# Patient Record
Sex: Female | Born: 1945 | Race: White | Hispanic: No | Marital: Married | Smoking: Former smoker
Health system: Southern US, Community
[De-identification: ages and names within clinical notes are randomized; demographics above are authoritative.]

## PROBLEM LIST (undated history)

## (undated) DIAGNOSIS — Z9889 Other specified postprocedural states: Secondary | ICD-10-CM

## (undated) DIAGNOSIS — R112 Nausea with vomiting, unspecified: Secondary | ICD-10-CM

## (undated) DIAGNOSIS — R7989 Other specified abnormal findings of blood chemistry: Secondary | ICD-10-CM

## (undated) DIAGNOSIS — K219 Gastro-esophageal reflux disease without esophagitis: Secondary | ICD-10-CM

## (undated) DIAGNOSIS — K649 Unspecified hemorrhoids: Secondary | ICD-10-CM

## (undated) DIAGNOSIS — F32A Depression, unspecified: Secondary | ICD-10-CM

## (undated) DIAGNOSIS — R945 Abnormal results of liver function studies: Secondary | ICD-10-CM

## (undated) DIAGNOSIS — I779 Disorder of arteries and arterioles, unspecified: Secondary | ICD-10-CM

## (undated) DIAGNOSIS — I739 Peripheral vascular disease, unspecified: Secondary | ICD-10-CM

## (undated) DIAGNOSIS — I119 Hypertensive heart disease without heart failure: Secondary | ICD-10-CM

## (undated) DIAGNOSIS — M1990 Unspecified osteoarthritis, unspecified site: Secondary | ICD-10-CM

## (undated) DIAGNOSIS — F329 Major depressive disorder, single episode, unspecified: Secondary | ICD-10-CM

## (undated) DIAGNOSIS — I10 Essential (primary) hypertension: Secondary | ICD-10-CM

## (undated) DIAGNOSIS — I350 Nonrheumatic aortic (valve) stenosis: Secondary | ICD-10-CM

## (undated) DIAGNOSIS — N200 Calculus of kidney: Secondary | ICD-10-CM

## (undated) DIAGNOSIS — G4700 Insomnia, unspecified: Secondary | ICD-10-CM

## (undated) DIAGNOSIS — I2510 Atherosclerotic heart disease of native coronary artery without angina pectoris: Secondary | ICD-10-CM

## (undated) DIAGNOSIS — F419 Anxiety disorder, unspecified: Secondary | ICD-10-CM

## (undated) DIAGNOSIS — E039 Hypothyroidism, unspecified: Secondary | ICD-10-CM

## (undated) DIAGNOSIS — O09299 Supervision of pregnancy with other poor reproductive or obstetric history, unspecified trimester: Secondary | ICD-10-CM

## (undated) DIAGNOSIS — E785 Hyperlipidemia, unspecified: Secondary | ICD-10-CM

## (undated) DIAGNOSIS — N6019 Diffuse cystic mastopathy of unspecified breast: Secondary | ICD-10-CM

## (undated) DIAGNOSIS — D649 Anemia, unspecified: Secondary | ICD-10-CM

## (undated) DIAGNOSIS — K5790 Diverticulosis of intestine, part unspecified, without perforation or abscess without bleeding: Secondary | ICD-10-CM

## (undated) HISTORY — DX: Diverticulosis of intestine, part unspecified, without perforation or abscess without bleeding: K57.90

## (undated) HISTORY — DX: Abnormal results of liver function studies: R94.5

## (undated) HISTORY — DX: Major depressive disorder, single episode, unspecified: F32.9

## (undated) HISTORY — DX: Diffuse cystic mastopathy of unspecified breast: N60.19

## (undated) HISTORY — DX: Depression, unspecified: F32.A

## (undated) HISTORY — DX: Hypertensive heart disease without heart failure: I11.9

## (undated) HISTORY — DX: Hyperlipidemia, unspecified: E78.5

## (undated) HISTORY — DX: Other specified abnormal findings of blood chemistry: R79.89

## (undated) HISTORY — DX: Unspecified hemorrhoids: K64.9

## (undated) HISTORY — DX: Insomnia, unspecified: G47.00

## (undated) HISTORY — DX: Peripheral vascular disease, unspecified: I73.9

## (undated) HISTORY — PX: HEMORRHOID SURGERY: SHX153

## (undated) HISTORY — DX: Atherosclerotic heart disease of native coronary artery without angina pectoris: I25.10

## (undated) HISTORY — DX: Essential (primary) hypertension: I10

## (undated) HISTORY — DX: Anxiety disorder, unspecified: F41.9

## (undated) HISTORY — DX: Calculus of kidney: N20.0

## (undated) HISTORY — DX: Supervision of pregnancy with other poor reproductive or obstetric history, unspecified trimester: O09.299

## (undated) HISTORY — DX: Disorder of arteries and arterioles, unspecified: I77.9

## (undated) HISTORY — PX: CARDIAC CATHETERIZATION: SHX172

---

## 1968-01-03 HISTORY — PX: TUBAL LIGATION: SHX77

## 1991-01-03 HISTORY — PX: CORONARY ARTERY BYPASS GRAFT: SHX141

## 1997-05-14 ENCOUNTER — Ambulatory Visit (HOSPITAL_COMMUNITY): Admission: RE | Admit: 1997-05-14 | Discharge: 1997-05-14 | Payer: Self-pay | Admitting: *Deleted

## 1997-12-03 ENCOUNTER — Other Ambulatory Visit: Admission: RE | Admit: 1997-12-03 | Discharge: 1997-12-03 | Payer: Self-pay | Admitting: *Deleted

## 1998-10-01 ENCOUNTER — Ambulatory Visit (HOSPITAL_COMMUNITY): Admission: RE | Admit: 1998-10-01 | Discharge: 1998-10-01 | Payer: Self-pay | Admitting: *Deleted

## 1998-10-01 ENCOUNTER — Encounter: Payer: Self-pay | Admitting: *Deleted

## 1998-10-05 ENCOUNTER — Ambulatory Visit (HOSPITAL_COMMUNITY): Admission: RE | Admit: 1998-10-05 | Discharge: 1998-10-05 | Payer: Self-pay | Admitting: *Deleted

## 1998-10-05 ENCOUNTER — Encounter: Payer: Self-pay | Admitting: *Deleted

## 1998-12-06 ENCOUNTER — Other Ambulatory Visit: Admission: RE | Admit: 1998-12-06 | Discharge: 1998-12-06 | Payer: Self-pay | Admitting: *Deleted

## 1999-06-22 ENCOUNTER — Encounter: Admission: RE | Admit: 1999-06-22 | Discharge: 1999-06-22 | Payer: Self-pay | Admitting: *Deleted

## 1999-06-22 ENCOUNTER — Other Ambulatory Visit: Admission: RE | Admit: 1999-06-22 | Discharge: 1999-06-22 | Payer: Self-pay | Admitting: *Deleted

## 1999-06-22 ENCOUNTER — Encounter: Payer: Self-pay | Admitting: *Deleted

## 1999-09-07 ENCOUNTER — Encounter: Admission: RE | Admit: 1999-09-07 | Discharge: 1999-09-07 | Payer: Self-pay | Admitting: *Deleted

## 1999-09-07 ENCOUNTER — Encounter: Payer: Self-pay | Admitting: *Deleted

## 2000-01-03 HISTORY — PX: CHOLECYSTECTOMY: SHX55

## 2000-02-20 ENCOUNTER — Other Ambulatory Visit: Admission: RE | Admit: 2000-02-20 | Discharge: 2000-02-20 | Payer: Self-pay | Admitting: *Deleted

## 2000-02-23 ENCOUNTER — Encounter: Admission: RE | Admit: 2000-02-23 | Discharge: 2000-02-23 | Payer: Self-pay | Admitting: *Deleted

## 2000-02-23 ENCOUNTER — Encounter: Payer: Self-pay | Admitting: *Deleted

## 2000-05-31 ENCOUNTER — Encounter: Admission: RE | Admit: 2000-05-31 | Discharge: 2000-05-31 | Payer: Self-pay | Admitting: Internal Medicine

## 2000-05-31 ENCOUNTER — Encounter: Payer: Self-pay | Admitting: Internal Medicine

## 2000-08-28 ENCOUNTER — Encounter: Admission: RE | Admit: 2000-08-28 | Discharge: 2000-08-28 | Payer: Self-pay | Admitting: *Deleted

## 2000-08-28 ENCOUNTER — Encounter: Payer: Self-pay | Admitting: *Deleted

## 2000-09-04 ENCOUNTER — Other Ambulatory Visit: Admission: RE | Admit: 2000-09-04 | Discharge: 2000-09-04 | Payer: Self-pay | Admitting: *Deleted

## 2000-09-10 ENCOUNTER — Encounter: Payer: Self-pay | Admitting: *Deleted

## 2000-09-10 ENCOUNTER — Encounter: Admission: RE | Admit: 2000-09-10 | Discharge: 2000-09-10 | Payer: Self-pay | Admitting: *Deleted

## 2000-11-10 ENCOUNTER — Encounter: Payer: Self-pay | Admitting: General Surgery

## 2000-11-10 ENCOUNTER — Encounter: Payer: Self-pay | Admitting: Emergency Medicine

## 2000-11-10 ENCOUNTER — Inpatient Hospital Stay (HOSPITAL_COMMUNITY): Admission: EM | Admit: 2000-11-10 | Discharge: 2000-11-11 | Payer: Self-pay | Admitting: Emergency Medicine

## 2001-03-27 ENCOUNTER — Other Ambulatory Visit: Admission: RE | Admit: 2001-03-27 | Discharge: 2001-03-27 | Payer: Self-pay | Admitting: *Deleted

## 2001-10-14 ENCOUNTER — Other Ambulatory Visit: Admission: RE | Admit: 2001-10-14 | Discharge: 2001-10-14 | Payer: Self-pay | Admitting: Obstetrics and Gynecology

## 2002-05-09 ENCOUNTER — Encounter: Payer: Self-pay | Admitting: Internal Medicine

## 2002-05-09 ENCOUNTER — Encounter: Admission: RE | Admit: 2002-05-09 | Discharge: 2002-05-09 | Payer: Self-pay | Admitting: Internal Medicine

## 2003-01-01 ENCOUNTER — Ambulatory Visit (HOSPITAL_COMMUNITY): Admission: RE | Admit: 2003-01-01 | Discharge: 2003-01-01 | Payer: Self-pay | Admitting: Internal Medicine

## 2003-02-24 ENCOUNTER — Other Ambulatory Visit: Admission: RE | Admit: 2003-02-24 | Discharge: 2003-02-24 | Payer: Self-pay | Admitting: Obstetrics and Gynecology

## 2005-12-14 ENCOUNTER — Other Ambulatory Visit: Admission: RE | Admit: 2005-12-14 | Discharge: 2005-12-14 | Payer: Self-pay | Admitting: Family Medicine

## 2007-01-15 ENCOUNTER — Ambulatory Visit (HOSPITAL_COMMUNITY): Admission: RE | Admit: 2007-01-15 | Discharge: 2007-01-15 | Payer: Self-pay | Admitting: Interventional Cardiology

## 2007-01-15 ENCOUNTER — Encounter (INDEPENDENT_AMBULATORY_CARE_PROVIDER_SITE_OTHER): Payer: Self-pay | Admitting: Interventional Cardiology

## 2008-06-03 ENCOUNTER — Ambulatory Visit: Payer: Self-pay | Admitting: Gastroenterology

## 2008-06-17 ENCOUNTER — Ambulatory Visit: Payer: Self-pay | Admitting: Gastroenterology

## 2008-06-17 ENCOUNTER — Encounter: Payer: Self-pay | Admitting: Gastroenterology

## 2008-06-19 ENCOUNTER — Encounter: Payer: Self-pay | Admitting: Gastroenterology

## 2008-06-29 DIAGNOSIS — Z860100 Personal history of colon polyps, unspecified: Secondary | ICD-10-CM | POA: Insufficient documentation

## 2008-06-29 DIAGNOSIS — K5909 Other constipation: Secondary | ICD-10-CM | POA: Insufficient documentation

## 2008-06-29 DIAGNOSIS — I2510 Atherosclerotic heart disease of native coronary artery without angina pectoris: Secondary | ICD-10-CM

## 2008-06-29 DIAGNOSIS — E785 Hyperlipidemia, unspecified: Secondary | ICD-10-CM | POA: Insufficient documentation

## 2008-06-29 DIAGNOSIS — K649 Unspecified hemorrhoids: Secondary | ICD-10-CM | POA: Insufficient documentation

## 2008-06-29 DIAGNOSIS — K6389 Other specified diseases of intestine: Secondary | ICD-10-CM

## 2008-07-02 ENCOUNTER — Ambulatory Visit: Payer: Self-pay | Admitting: Gastroenterology

## 2008-07-02 DIAGNOSIS — F3289 Other specified depressive episodes: Secondary | ICD-10-CM | POA: Insufficient documentation

## 2008-07-02 DIAGNOSIS — K625 Hemorrhage of anus and rectum: Secondary | ICD-10-CM

## 2008-07-02 DIAGNOSIS — F329 Major depressive disorder, single episode, unspecified: Secondary | ICD-10-CM

## 2008-07-02 DIAGNOSIS — E039 Hypothyroidism, unspecified: Secondary | ICD-10-CM | POA: Insufficient documentation

## 2008-07-02 DIAGNOSIS — Z87442 Personal history of urinary calculi: Secondary | ICD-10-CM | POA: Insufficient documentation

## 2008-07-03 ENCOUNTER — Encounter: Payer: Self-pay | Admitting: Gastroenterology

## 2008-07-03 LAB — CONVERTED CEMR LAB: CRP: 0 mg/dL (ref <0.6)

## 2008-07-27 DIAGNOSIS — D509 Iron deficiency anemia, unspecified: Secondary | ICD-10-CM

## 2008-07-27 LAB — CONVERTED CEMR LAB
Ferritin: 19.7 ng/mL (ref 10.0–291.0)
Folate: >20 ng/mL
Iron: 78 ug/dL (ref 42–145)
Saturation Ratios: 19.9 % — ABNORMAL LOW (ref 20.0–50.0)
Transferrin: 280.1 mg/dL (ref 212.0–360.0)
Vitamin B-12: 466 pg/mL (ref 211–911)

## 2008-08-03 ENCOUNTER — Ambulatory Visit: Payer: Self-pay | Admitting: Gastroenterology

## 2008-11-13 ENCOUNTER — Telehealth (INDEPENDENT_AMBULATORY_CARE_PROVIDER_SITE_OTHER): Payer: Self-pay | Admitting: *Deleted

## 2010-01-23 ENCOUNTER — Encounter: Payer: Self-pay | Admitting: Obstetrics and Gynecology

## 2012-02-12 ENCOUNTER — Encounter (INDEPENDENT_AMBULATORY_CARE_PROVIDER_SITE_OTHER): Payer: Medicare Other | Admitting: General Surgery

## 2012-02-15 ENCOUNTER — Ambulatory Visit (INDEPENDENT_AMBULATORY_CARE_PROVIDER_SITE_OTHER): Payer: Medicare Other | Admitting: General Surgery

## 2012-02-22 ENCOUNTER — Ambulatory Visit (INDEPENDENT_AMBULATORY_CARE_PROVIDER_SITE_OTHER): Payer: Medicare Other | Admitting: General Surgery

## 2012-02-23 ENCOUNTER — Encounter (INDEPENDENT_AMBULATORY_CARE_PROVIDER_SITE_OTHER): Payer: Self-pay | Admitting: General Surgery

## 2012-02-29 ENCOUNTER — Ambulatory Visit (INDEPENDENT_AMBULATORY_CARE_PROVIDER_SITE_OTHER): Payer: Medicare Other | Admitting: General Surgery

## 2012-02-29 ENCOUNTER — Encounter (INDEPENDENT_AMBULATORY_CARE_PROVIDER_SITE_OTHER): Payer: Self-pay | Admitting: General Surgery

## 2012-02-29 VITALS — BP 134/78 | HR 78 | Temp 98.4°F | Resp 18 | Ht 64.0 in | Wt 140.2 lb

## 2012-02-29 DIAGNOSIS — K648 Other hemorrhoids: Secondary | ICD-10-CM | POA: Insufficient documentation

## 2012-02-29 DIAGNOSIS — K644 Residual hemorrhoidal skin tags: Secondary | ICD-10-CM

## 2012-02-29 NOTE — Progress Notes (Signed)
Patient ID: Dawn Boyer, female   DOB: [birth year], 67 y.o.   MRN: [id number]  Chief Complaint  Patient presents with  . New Evaluation    eval hems    HPI Dawn Boyer is a 67 y.o. female.   HPI 66 year old Caucasian female referred by Dr. Claude Manges for evaluation of bleeding hemorrhoids. The patient states that she has had hemorrhoidal problems for 40 years ever since her birth of her son. She states that she has daily bowel movements however that is only with the aid of an enema. She uses enemas on a daily basis. She states every year she has tried numerous efforts for having regular bowel movements. She has tried laxatives as well as Metamucil all without any benefit. She denies any pain with defecation. Occasionally when one of the hemorrhoids pops out it'll cause a fair amount of discomfort and pain. Her main issue is significant bleeding. She states that she's actually become anemic because of her hemorrhoidal bleeding. She states that she's had numerous colonoscopies over the years all of which have been normal except for colonic polyps. I do not have a copy of these records. Occasionally the stools will be very hard. She only drinks about 2 glasses of water a day. She does tend to sit on the commode for a period of time. When her hemorrhoids do pop out and play her she will do sitz baths as well as Preparation H. She describes the bleeding as mainly in the toilet as well as mixed in the stool. She has had a prior sclerotherapy to her hemorrhoids about 13 years ago which worked for short term Past Medical History  Diagnosis Date  . Coronary artery disease   . Hypertension   . Hyperlipidemia   . History of hemolysis, elevated liver enzymes, and low platelet (HELLP) syndrome, currently pregnant   . Hemorrhoid   . Fibrocystic breast   . Insomnia   . Anxiety   . Depression   . Heart disease     Past Surgical History  Procedure Laterality Date  . Coronary artery bypass graft  1993  .  Tubal ligation  1970  . Hemorrhoid surgery  2001  . Cholecystectomy  2002    Family History  Problem Relation Age of Onset  . Heart disease Sister   . Heart disease Brother     Social History History  Substance Use Topics  . Smoking status: Former Smoker    Quit date: 11/21/1982  . Smokeless tobacco: Never Used  . Alcohol Use: No    No Known Allergies  Current Outpatient Prescriptions  Medication Sig Dispense Refill  . ALPRAZolam (XANAX) 0.25 MG tablet Take 0.25 mg by mouth at bedtime as needed for sleep.      Marland Kitchen aspirin 325 MG tablet Take 325 mg by mouth daily.      Marland Kitchen atorvastatin (LIPITOR) 80 MG tablet Take 80 mg by mouth daily.      . Calcium Carbonate (CALCIUM 600 PO) Take 1 tablet by mouth daily.      . Cholecalciferol (VITAMIN D-3) 1000 UNITS CAPS Take by mouth daily.      Marland Kitchen esomeprazole (NEXIUM) 40 MG capsule Take 40 mg by mouth daily before breakfast.      . ezetimibe (ZETIA) 10 MG tablet Take 10 mg by mouth daily.      Marland Kitchen FeFum-FePo-FA-B Cmp-C-Zn-Mn-Cu (SE-TAN PLUS) 162-115.2-1 MG CAPS Take by mouth.      . levothyroxine (SYNTHROID, LEVOTHROID) 50 MCG tablet  Take 50 mcg by mouth daily.      . metoprolol succinate (TOPROL-XL) 50 MG 24 hr tablet Take 50 mg by mouth daily. Take with or immediately following a meal.      . phosphate (FLEET) enema Place 1 enema rectally once as needed for constipation.      . quinapril (ACCUPRIL) 20 MG tablet Take 20 mg by mouth at bedtime.      . sertraline (ZOLOFT) 50 MG tablet Take 50 mg by mouth daily.      . vitamin E 400 UNIT capsule Take 400 Units by mouth daily.       No current facility-administered medications for this visit.    Review of Systems Review of Systems  Constitutional: Negative for fever, chills and unexpected weight change.  HENT: Negative for hearing loss, congestion, sore throat, trouble swallowing and voice change.   Eyes: Negative for visual disturbance.  Respiratory: Negative for cough and wheezing.    Cardiovascular: Negative for chest pain, palpitations and leg swelling.       Denies CP, DOE, SOB, PND  Gastrointestinal: Positive for constipation and anal bleeding. Negative for nausea, vomiting, abdominal pain, diarrhea, blood in stool and abdominal distention.  Genitourinary: Negative for hematuria, vaginal bleeding and difficulty urinating.  Musculoskeletal: Negative for arthralgias.       + joint pain  Skin: Negative for rash and wound.  Neurological: Positive for dizziness. Negative for seizures, syncope and headaches.  Hematological: Negative for adenopathy. Does not bruise/bleed easily.  Psychiatric/Behavioral: Negative for confusion.    Blood pressure 134/78, pulse 78, temperature 98.4 F (36.9 C), temperature source Temporal, resp. rate 18, height 5\' 4"  (1.626 m), weight 140 lb 3.2 oz (63.594 kg).  Physical Exam Physical Exam  Vitals reviewed. Constitutional: She is oriented to person, place, and time. She appears well-developed and well-nourished. No distress.  HENT:  Head: Normocephalic and atraumatic.  Right Ear: External ear normal.  Left Ear: External ear normal.  Eyes: Conjunctivae are normal. No scleral icterus.  Neck: Normal range of motion. Neck supple. No tracheal deviation present. No thyromegaly present.  Cardiovascular: Normal rate, regular rhythm and normal heart sounds.   Pulmonary/Chest: Effort normal and breath sounds normal. No respiratory distress. She has no wheezes. She has no rales.  Abdominal: Soft. She exhibits no distension. There is no tenderness. There is no rebound and no guarding.  Well healed trocar sites  Genitourinary: Rectal exam shows external hemorrhoid and internal hemorrhoid. Rectal exam shows no fissure and anal tone normal.  Circumferential nonthrombosed redundant external hemorrhoidal tissue. A little diminished rectal tone. Patient has a partially prolapsed large left-sided internal hemorrhoid. Anoscopy showed a large left-sided  Somewhat friable internal hemorrhoid; Right anterior and posterior internal hemorrhoids. The right posterior hemorrhoid was larger and more friable appearing than the anterior hemorrhoid  Musculoskeletal: She exhibits no edema and no tenderness.  Lymphadenopathy:    She has no cervical adenopathy.  Neurological: She is alert and oriented to person, place, and time.  Skin: Skin is warm and dry. No rash noted. She is not diaphoretic. No erythema.  Psychiatric: She has a normal mood and affect. Her behavior is normal. Judgment and thought content normal.    Data Reviewed Dr Orvan July note 2/6  Assessment    Circumferential non-thrombosed external hemorrhoids Large partially prolapsed left internal hemorrhoid Rt anterior and posterior internal hemorrhoids (grade 1 and 2)     Plan    We discussed the etiology of hemorrhoids. The patient was given  educational material as well as diagrams. We discussed nonoperative and operative management of hemorrhoidal disease.  We discussed the importance of having a daily soft bowel movement and avoiding constipation. We also discussed good bowel habits such as not reading in the bathroom, not straining, and drinking 6-8 glasses of water per day. We also discussed the importance of a high fiber diet. We discussed foods that were high in fiber as well as fiber supplements. We discussed the importance of trying to get 25-30 g of fiber per day in their diet. We discussed the need to start with a low dose of fiber and then gradually increasing their daily fiber dose over several weeks in order to avoid bloating and cramping.  We then discussed different surgical techniques for hemorrhoids, specifically hemorrhoidal banding and excisional hemorrhoidectomy.  PLAN: The patient has chronic issues with bowel movements. I am not sure if increasing her water intake and increasing her fiber supplement likely correct her underlying bowel motility. Nonetheless I did highly  encourage her to increase her water intake as well as to slowly add a fiber supplement to her diet such as Benefiber or Metamucil. Even with Addition of the strategies I think she needs and  would benefit from hemorrhoidectomy and hemorrhoidal banding.  I believe the left-sided hemorrhoid would only be a candidate for an excisional hemorrhoidectomy. Wears a right-sided hemorrhoids are a candidate for hemorrhoidal banding.  I discussed the procedure in detail.  The patient was given Agricultural engineer.  We discussed the risks and benefits of surgery including, but not limited to bleeding, infection, blood clot formation, anesthesia risk, urinary retention, hemorrhoid recurrence, injury to the sphincters resulting in incontinence, and the rare possibility of anal canal narrowing. I explained that the likelihood of improvement of their symptoms is fair  We discussed the typical postoperative course.  I stressed the importance of not becoming constipated after surgery.  The patient was encouraged to limit pain medication if possible as this increases the likelihood of becoming constipated. The patient was advised to take stool softners & drink 8-10 glasses of non-carbonated, non-alcoholic beverages per day and to eat a high fiber diet.  I also encouraged soaking in a water warm bath for 15 minutes at a time several times a day and after a bowel movement.  The patient was advised to take laxatives such as milk of magnesia or Miralax if no bowel movement three days after surgery.  The patient was advised to expect some blood tinged drainage as well as some blood in their bowel movements.    I explained to the patient that she is at an increased risk for hemorrhoidal recurrence due to her underlying bowel habits. She would like to proceed with surgery. She will be scheduled for exam under anesthesia, excisional hemorrhoidectomy and hemorrhoidal banding in the near future  Jeanifer Halliday M. Andrey Campanile, MD, FACS General,  Bariatric, & Minimally Invasive Surgery [facility], [place]        [facility] M 02/29/2012, 3:35 PM

## 2012-02-29 NOTE — Patient Instructions (Signed)
Increase the water that you drink on a daily basis to 6-8 glasses of water a day. Also consider getting a fiber supplement such as Metamucil or Benefiber. Remember that you need to slowly increase the fiber in your diet over 2 weeks in order to avoid bloating and cramping as discussed  GETTING TO GOOD BOWEL HEALTH. Irregular bowel habits such as constipation and diarrhea can lead to many problems over time.  Having one soft bowel movement a day is the most important way to prevent further problems.  The anorectal canal is designed to handle stretching and feces to safely manage our ability to get rid of solid waste (feces, poop, stool) out of our body.  BUT, hard constipated stools can act like ripping concrete bricks and diarrhea can be a burning fire to this very sensitive area of our body, causing inflamed hemorrhoids, anal fissures, increasing risk is perirectal abscesses, abdominal pain/bloating, an making irritable bowel worse.     The goal: ONE SOFT BOWEL MOVEMENT A DAY!  To have soft, regular bowel movements:    Drink at least 8 tall glasses of water a day.     Take plenty of fiber.  Fiber is the undigested part of plant food that passes into the colon, acting s "natures broom" to encourage bowel motility and movement.  Fiber can absorb and hold large amounts of water. This results in a larger, bulkier stool, which is soft and easier to pass. Work gradually over several weeks up to 6 servings a day of fiber (25g a day even more if needed) in the form of: o Vegetables -- Root (potatoes, carrots, turnips), leafy green (lettuce, salad greens, celery, spinach), or cooked high residue (cabbage, broccoli, etc) o Fruit -- Fresh (unpeeled skin & pulp), Dried (prunes, apricots, cherries, etc ),  or stewed ( applesauce)  o Whole grain breads, pasta, etc (whole wheat)  o Bran cereals    Bulking Agents -- This type of water-retaining fiber generally is easily obtained each day by one of the following:   o Psyllium bran -- The psyllium plant is remarkable because its ground seeds can retain so much water. This product is available as Metamucil, Konsyl, Effersyllium, Per Diem Fiber, or the less expensive generic preparation in drug and health food stores. Although labeled a laxative, it really is not a laxative.  o Methylcellulose -- This is another fiber derived from wood which also retains water. It is available as Citrucel. o Polyethylene Glycol - and "artificial" fiber commonly called Miralax or Glycolax.  It is helpful for people with gassy or bloated feelings with regular fiber o Flax Seed - a less gassy fiber than psyllium   No reading or other relaxing activity while on the toilet. If bowel movements take longer than 5 minutes, you are too constipated   AVOID CONSTIPATION.  High fiber and water intake usually takes care of this.  Sometimes a laxative is needed to stimulate more frequent bowel movements, but    Laxatives are not a good long-term solution as it can wear the colon out. o Osmotics (Milk of Magnesia, Fleets phosphosoda, Magnesium citrate, MiraLax, GoLytely) are safer than  o Stimulants (Senokot, Castor Oil, Dulcolax, Ex Lax)    o Do not take laxatives for more than 7days in a row.    IF SEVERELY CONSTIPATED, try a Bowel Retraining Program: o Do not use laxatives.  o Eat a diet high in roughage, such as bran cereals and leafy vegetables.  o Drink six (  6) ounces of prune or apricot juice each morning.  o Eat two (2) large servings of stewed fruit each day.  o Take one (1) heaping tablespoon of a psyllium-based bulking agent twice a day. Use sugar-free sweetener when possible to avoid excessive calories.  o Eat a normal breakfast.  o Set aside 15 minutes after breakfast to sit on the toilet, but do not strain to have a bowel movement.  o If you do not have a bowel movement by the third day, use an enema and repeat the above steps.

## 2012-03-05 ENCOUNTER — Encounter (HOSPITAL_COMMUNITY): Payer: Self-pay | Admitting: Respiratory Therapy

## 2012-03-08 ENCOUNTER — Encounter (HOSPITAL_COMMUNITY): Admission: RE | Admit: 2012-03-08 | Payer: Medicare Other | Source: Ambulatory Visit

## 2012-03-08 NOTE — Pre-Procedure Instructions (Signed)
SHAMIYAH NGU  03/08/2012   Your procedure is scheduled on:  Thursday, March 13  Report to [facility] at 1000 AM.  Call this number if you have problems the morning of surgery: [phone]   Remember:             Stop aspirin, Vitamin E, and do not take   medications that may thin your blood or take herbal medications                       Do not eat food or drink liquids after midnight.Wednesday night   Take these medicines the morning of surgery with A SIP OF WATER:             Nexium,Levothyroxine,Metoprolol,Zoloft   Do not wear jewelry, make-up or nail polish.  Do not wear lotions, powders, or perfumes or   deodorant.  Do not shave 48 hours prior to surgery.    Do not bring valuables to the hospital.  Contacts, dentures or bridgework may not be worn into surgery.     Patients discharged the day of surgery will not be allowed to drive  home.  Name and phone number of your driver:    Special Instructions: Shower using CHG 2 nights before surgery and the night before surgery.  If you shower the day of surgery use CHG.  Use special wash - you have one bottle of CHG for all showers.  You should use approximately 1/3 of the bottle for each shower.   Please read over the following fact sheets that you were given: Pain Booklet, Coughing and Deep Breathing and Surgical Site Infection Prevention

## 2012-03-11 ENCOUNTER — Encounter (HOSPITAL_COMMUNITY)
Admission: RE | Admit: 2012-03-11 | Discharge: 2012-03-11 | Disposition: A | Discharge status: Home / Self Care | Payer: Medicare Other | Source: Ambulatory Visit | Attending: General Surgery | Admitting: General Surgery

## 2012-03-11 ENCOUNTER — Encounter (HOSPITAL_COMMUNITY)
Admission: RE | Admit: 2012-03-11 | Discharge: 2012-03-11 | Disposition: A | Discharge status: Home / Self Care | Payer: Medicare Other | Source: Ambulatory Visit | Attending: Anesthesiology | Admitting: Anesthesiology

## 2012-03-11 ENCOUNTER — Encounter (HOSPITAL_COMMUNITY): Payer: Self-pay

## 2012-03-11 HISTORY — DX: Gastro-esophageal reflux disease without esophagitis: K21.9

## 2012-03-11 HISTORY — DX: Other specified postprocedural states: Z98.890

## 2012-03-11 HISTORY — DX: Anemia, unspecified: D64.9

## 2012-03-11 HISTORY — DX: Other specified postprocedural states: R11.2

## 2012-03-11 HISTORY — DX: Hypothyroidism, unspecified: E03.9

## 2012-03-11 HISTORY — DX: Unspecified osteoarthritis, unspecified site: M19.90

## 2012-03-11 LAB — CBC WITH DIFFERENTIAL/PLATELET
HCT: 34 % — ABNORMAL LOW (ref 36.0–46.0)
Hemoglobin: 11.9 g/dL — ABNORMAL LOW (ref 12.0–15.0)
Lymphs Abs: 2.2 10*3/uL (ref 0.7–4.0)
MCH: 32.3 pg (ref 26.0–34.0)
Monocytes Absolute: 0.7 10*3/uL (ref 0.1–1.0)
Monocytes Relative: 10 % (ref 3–12)
Neutro Abs: 3.6 10*3/uL (ref 1.7–7.7)
Neutrophils Relative %: 54 % (ref 43–77)
RBC: 3.68 MIL/uL — ABNORMAL LOW (ref 3.87–5.11)

## 2012-03-11 LAB — BASIC METABOLIC PANEL WITH GFR
BUN: 16 mg/dL (ref 6–23)
CO2: 28 meq/L (ref 19–32)
Calcium: 9.4 mg/dL (ref 8.4–10.5)
Chloride: 107 meq/L (ref 96–112)
Creatinine, Ser: 0.79 mg/dL (ref 0.50–1.10)
GFR calc Af Amer: >90 mL/min
GFR calc non Af Amer: 86 mL/min — ABNORMAL LOW
Glucose, Bld: 78 mg/dL (ref 70–99)
Potassium: 4 meq/L (ref 3.5–5.1)
Sodium: 143 meq/L (ref 135–145)

## 2012-03-11 LAB — HEPATIC FUNCTION PANEL
ALT: 27 U/L (ref 0–35)
AST: 36 U/L (ref 0–37)
Alkaline Phosphatase: 123 U/L — ABNORMAL HIGH (ref 39–117)
Bilirubin, Direct: <0.1 mg/dL (ref 0.0–0.3)
Total Bilirubin: 0.4 mg/dL (ref 0.3–1.2)

## 2012-03-11 LAB — SURGICAL PCR SCREEN
MRSA, PCR: NEGATIVE
Staphylococcus aureus: NEGATIVE

## 2012-03-11 NOTE — Pre-Procedure Instructions (Addendum)
Dawn Boyer  03/11/2012   Your procedure is scheduled on:  Thursday, March 13  Report to [facility] at 1000 AM.  Call this number if you have problems the morning of surgery: [phone]   Remember:             Stop aspirin, Vitamin E, and do not take   medications that may thin your blood or take herbal medications                       Do not eat food or drink liquids after midnight.Wednesday night   Take these medicines the morning of surgery with A SIP OF WATER:             Nexium,Levothyroxine,Metoprolol,Zoloft,xanax   Do not wear jewelry, make-up or nail polish.  Do not wear lotions, powders, or perfumes or   deodorant.  Do not shave 48 hours prior to surgery.    Do not bring valuables to the hospital.  Contacts, dentures or bridgework may not be worn into surgery.     Patients discharged the day of surgery will not be allowed to drive  home.  Name and phone number of your driver:    Special Instructions: Shower using CHG 2 nights before surgery and the night before surgery.  If you shower the day of surgery use CHG.  Use special wash - you have one bottle of CHG for all showers.  You should use approximately 1/3 of the bottle for each shower.   Please read over the following fact sheets that you were given: Pain Booklet, Coughing and Deep Breathing and Surgical Site Infection Prevention

## 2012-03-11 NOTE — Progress Notes (Addendum)
Anesthesia PAT evaluation:  Patient is a 67 year old female scheduled for exam under anesthesia with excisional hemorrhoidectomy and hemorrhoidal banding by Dr. Andrey Campanile on 03/14/12.  History includes bleeding hemorrhoids, former smoker since 1984, CAD status post CABG in 1994, hyperlipidemia, hypertension, fibrocystic breast, anxiety, depression, hypothyroidism, GERD, anemia, arthritis, elevated LFTs without known history of hepatitis.  PCP has been Claude Manges, NP at [facility], who is now leaving the practice.  Her cardiologist is Dr. Eldridge Dace, last visit 12/2010.  Preoperative labs noted.  CXR on 03/11/12 showed: No acute cardiopulmonary disease. 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  EKG today showed SR with occasional PVCs, ST/T wave abnormality in inferior and anterolateral leads, consider ischemia.  Currently there are no comparison EKGs available.  She does not recall a recent stress test.  An echo in 01/15/07 showed EF 60-65%, no wall motion abnormalities, increased relative contribution of atrial contraction to LV filling, mildly calcified AV, mild mitral annular calcification.  Due to abnormal EKG and CAD history, I spoke with patient during her PAT visit.  She denied chest pain, but reported that over the last 2-3 months she began having neck tightness--choking sensation with exertional activity such as carrying heavy groceries to and from her car.  Symptoms went away with rest. She has not reported these symptoms to anyone else.  She has experienced these symptoms two or three times in the last few months.  She said these symptoms are similar, but not as persistent or severe as when she presented in 1994 and required CABG the next day.  She did not have chest pain at the time when she required CABG in 1994--just neck tightness/choking sensation that would not improve with rest. She has felt a little more tired, but has also had some anemia related to  her hemorrhoids. She denies any significant SOB or new edema.    Records from [facility] have been requested.  I also called and spoke with Dr. Eldridge Dace about planned procedure and to report her symptoms and EKG findings.  (I also faxed a copy of today's EKG to Dr. Eldridge Dace.)  Since patient has known CAD with similar symptoms to when she presented with need for CABG in 1994, Dr. Eldridge Dace felt patient should undergo a stress test before undergoing a non-emergent procedure.  He will see if his scheduler is able to set up a stress test prior to her surgery date and contact the patient.  Patient's symptoms have only occurred a few times with exertional activity, and otherwise patient has been asymptomatic with her usual ADLs.  She is aware that if she develops new chest pain, symptoms at rest, more progressive or severe symptoms, or symptoms that do not resolve with rest then she should call 911.  I will notify Dr. Andrey Campanile of the above events.  [facility]Anesthesiology Phone [phone] 03/11/2012 5:32 PM  Addendum: 03/13/2012 1640 Patient was scheduled for a stress test at [facility] early this afternoon.  Jade at [facility] has already contacted [facility] requesting an update on patient's clearance by early tomorrow morning.  (Her procedure is currently scheduled for 0930.)  I will await further input as available.

## 2012-03-12 ENCOUNTER — Telehealth (INDEPENDENT_AMBULATORY_CARE_PROVIDER_SITE_OTHER): Payer: Self-pay | Admitting: General Surgery

## 2012-03-12 NOTE — Telephone Encounter (Signed)
Patient having stress test tomorrow at noon and they will give clearance after.

## 2012-03-12 NOTE — Telephone Encounter (Signed)
Spoke with patient and she states she has called a couple times to Dr Hoyle Barr office and has not heard back. I called Dr Hoyle Barr office and left message for his nurse, Amy. I called to see if this was possible to have her stress test by 03/13/12. Awaiting call back.

## 2012-03-12 NOTE — Telephone Encounter (Signed)
Message copied by Liliana Cline on Tue Mar 12, 2012  9:15 AM ------      Message from: Andrey Campanile, ERIC M      Created: Tue Mar 12, 2012  7:45 AM      Regarding: FW: stress test       See below. Lesly Rubenstein - can you contact this pt and see if she has heard from Dr Hoyle Barr office about stress test? If not able to be done by 3/12 then we will need to reschedule surgery. If have to move case, then please move up my last case so there is not a gap in my schedule.             Thanks      Andrey Campanile            ----- Message -----         From: Jerold Coombe, PA-C         Sent: 03/11/2012   5:33 PM           To: Atilano Ina, MD      Subject: stress test                                              Dr. Jobe Gibbon came to PAT this afternoon.  History includes CABG '94. cardiology records from Dr. Eldridge Dace are still pending, but her EKG today showed diffuse ST/T wave abnormality.  When I asked her about any symptoms, she denied chest pain but reported exertional neck tightness/choking sensation over the past 2-3 months.  She had similar symptoms when she required CABG in 1994--except back then the symptoms were more severe and wouldn't improve with rest.  I faxed her EKG to Dr. Eldridge Dace and spoke with him over the telephone about her symptoms and planned surgery.  He is recommending that she undergo a stress test.  He said he would do his best to schedule it before 03/14/12, but I've not heard if he was able to do this or not.              Dawn Boyer      [facility]/Anesthesiology      Phone [phone]      03/11/2012 5:44 PM                   ------

## 2012-03-13 MED ORDER — CEFOXITIN SODIUM 2 G IV SOLR
2.0000 g | INTRAVENOUS | Status: AC
  Administered 2012-03-14: 2 g via INTRAVENOUS
  Filled 2012-03-13: qty 2

## 2012-03-13 NOTE — Telephone Encounter (Signed)
Dawn Boyer called back and is working with Dr Eldridge Dace to get this done ASAP. He is supposed to be at the office after 5:00 pm today and is supposed to be reading her study. She will call me or fax me the results/clearance as soon as she gets it. She is aware if it is not by early am this surgery will have to be cancelled.

## 2012-03-13 NOTE — Telephone Encounter (Signed)
Called Dr Hoyle Barr office and left message for his nurse. TO make her aware we need clearance today or we will have to cancel surgery tomorrow. Awaiting call back to check on status.

## 2012-03-13 NOTE — Telephone Encounter (Signed)
Left message on machine for patient to call back and ask for me. To make her aware I have left a message with Dr Hoyle Barr office. We are giving them until early am to get back with [place] but if we do not hear that she is cleared for surgery her surgery will be cancelled for tomorrow.

## 2012-03-14 ENCOUNTER — Encounter (INDEPENDENT_AMBULATORY_CARE_PROVIDER_SITE_OTHER): Payer: Self-pay

## 2012-03-14 ENCOUNTER — Encounter (HOSPITAL_COMMUNITY): Payer: Self-pay | Admitting: *Deleted

## 2012-03-14 ENCOUNTER — Ambulatory Visit (HOSPITAL_COMMUNITY): Payer: Medicare Other | Admitting: Certified Registered"

## 2012-03-14 ENCOUNTER — Ambulatory Visit (HOSPITAL_COMMUNITY)
Admission: RE | Admit: 2012-03-14 | Discharge: 2012-03-14 | Disposition: A | Discharge status: Home / Self Care | Payer: Medicare Other | Source: Ambulatory Visit | Attending: General Surgery | Admitting: General Surgery

## 2012-03-14 ENCOUNTER — Encounter (HOSPITAL_COMMUNITY): Payer: Self-pay | Admitting: Vascular Surgery

## 2012-03-14 ENCOUNTER — Encounter (HOSPITAL_COMMUNITY)
Admission: RE | Disposition: A | Discharge status: Home / Self Care | Payer: Self-pay | Source: Ambulatory Visit | Attending: General Surgery

## 2012-03-14 DIAGNOSIS — F329 Major depressive disorder, single episode, unspecified: Secondary | ICD-10-CM | POA: Insufficient documentation

## 2012-03-14 DIAGNOSIS — I10 Essential (primary) hypertension: Secondary | ICD-10-CM | POA: Insufficient documentation

## 2012-03-14 DIAGNOSIS — K648 Other hemorrhoids: Secondary | ICD-10-CM | POA: Insufficient documentation

## 2012-03-14 DIAGNOSIS — F411 Generalized anxiety disorder: Secondary | ICD-10-CM | POA: Insufficient documentation

## 2012-03-14 DIAGNOSIS — Z7982 Long term (current) use of aspirin: Secondary | ICD-10-CM | POA: Insufficient documentation

## 2012-03-14 DIAGNOSIS — M129 Arthropathy, unspecified: Secondary | ICD-10-CM | POA: Insufficient documentation

## 2012-03-14 DIAGNOSIS — K644 Residual hemorrhoidal skin tags: Secondary | ICD-10-CM

## 2012-03-14 DIAGNOSIS — F3289 Other specified depressive episodes: Secondary | ICD-10-CM | POA: Insufficient documentation

## 2012-03-14 DIAGNOSIS — Z87891 Personal history of nicotine dependence: Secondary | ICD-10-CM | POA: Insufficient documentation

## 2012-03-14 DIAGNOSIS — Z79899 Other long term (current) drug therapy: Secondary | ICD-10-CM | POA: Insufficient documentation

## 2012-03-14 DIAGNOSIS — E785 Hyperlipidemia, unspecified: Secondary | ICD-10-CM | POA: Insufficient documentation

## 2012-03-14 DIAGNOSIS — E039 Hypothyroidism, unspecified: Secondary | ICD-10-CM | POA: Insufficient documentation

## 2012-03-14 DIAGNOSIS — I2510 Atherosclerotic heart disease of native coronary artery without angina pectoris: Secondary | ICD-10-CM | POA: Insufficient documentation

## 2012-03-14 HISTORY — PX: EVALUATION UNDER ANESTHESIA WITH HEMORRHOIDECTOMY: SHX5624

## 2012-03-14 SURGERY — EXAM UNDER ANESTHESIA WITH HEMORRHOIDECTOMY
Anesthesia: General | Site: Rectum | Wound class: Clean Contaminated

## 2012-03-14 MED ORDER — MIDAZOLAM HCL 5 MG/5ML IJ SOLN
INTRAMUSCULAR | Status: DC | PRN
  Administered 2012-03-14: 2 mg via INTRAVENOUS

## 2012-03-14 MED ORDER — ACETAMINOPHEN 325 MG PO TABS
650.0000 mg | ORAL_TABLET | ORAL | Status: DC | PRN
  Administered 2012-03-14: 650 mg via ORAL

## 2012-03-14 MED ORDER — ACETAMINOPHEN 650 MG RE SUPP: 650.0000 mg | RECTAL | Status: DC | PRN

## 2012-03-14 MED ORDER — PROPOFOL 10 MG/ML IV BOLUS
INTRAVENOUS | Status: DC | PRN
  Administered 2012-03-14: 150 mg via INTRAVENOUS

## 2012-03-14 MED ORDER — NEOSTIGMINE METHYLSULFATE 1 MG/ML IJ SOLN
INTRAMUSCULAR | Status: DC | PRN
  Administered 2012-03-14: 3 mg via INTRAVENOUS

## 2012-03-14 MED ORDER — ONDANSETRON HCL 4 MG/2ML IJ SOLN
INTRAMUSCULAR | Status: DC | PRN
  Administered 2012-03-14: 4 mg via INTRAVENOUS

## 2012-03-14 MED ORDER — DEXTROSE 5 % IV SOLN
INTRAVENOUS | Status: AC
  Filled 2012-03-14 (×2): qty 1

## 2012-03-14 MED ORDER — BUPIVACAINE LIPOSOME 1.3 % IJ SUSP
20.0000 mL | INTRAMUSCULAR | Status: DC
  Filled 2012-03-14: qty 20

## 2012-03-14 MED ORDER — BUPIVACAINE-EPINEPHRINE 0.25% -1:200000 IJ SOLN
INTRAMUSCULAR | Status: AC
  Filled 2012-03-14: qty 1

## 2012-03-14 MED ORDER — LACTATED RINGERS IV SOLN
INTRAVENOUS | Status: DC
  Administered 2012-03-14: 11:00:00 via INTRAVENOUS
  Administered 2012-03-14: 50 mL/h via INTRAVENOUS

## 2012-03-14 MED ORDER — DIBUCAINE 1 % RE OINT
TOPICAL_OINTMENT | RECTAL | Status: AC
  Filled 2012-03-14: qty 28

## 2012-03-14 MED ORDER — ACETAMINOPHEN 325 MG PO TABS
ORAL_TABLET | ORAL | Status: AC
  Filled 2012-03-14: qty 2

## 2012-03-14 MED ORDER — 0.9 % SODIUM CHLORIDE (POUR BTL) OPTIME
TOPICAL | Status: DC | PRN
  Administered 2012-03-14: 1000 mL

## 2012-03-14 MED ORDER — LIDOCAINE HCL (CARDIAC) 20 MG/ML IV SOLN
INTRAVENOUS | Status: DC | PRN
  Administered 2012-03-14: 80 mg via INTRAVENOUS

## 2012-03-14 MED ORDER — GLYCOPYRROLATE 0.2 MG/ML IJ SOLN
INTRAMUSCULAR | Status: DC | PRN
  Administered 2012-03-14: 0.4 mg via INTRAVENOUS

## 2012-03-14 MED ORDER — ROCURONIUM BROMIDE 100 MG/10ML IV SOLN
INTRAVENOUS | Status: DC | PRN
  Administered 2012-03-14: 30 mg via INTRAVENOUS

## 2012-03-14 MED ORDER — BUPIVACAINE LIPOSOME 1.3 % IJ SUSP
INTRAMUSCULAR | Status: DC | PRN
  Administered 2012-03-14: 20 mL

## 2012-03-14 MED ORDER — BUPIVACAINE-EPINEPHRINE 0.25% -1:200000 IJ SOLN
INTRAMUSCULAR | Status: DC | PRN
  Administered 2012-03-14: 4 mL

## 2012-03-14 MED ORDER — HYDROCODONE-ACETAMINOPHEN 5-325 MG PO TABS: 1.0000 | ORAL_TABLET | Freq: Four times a day (QID) | ORAL | Status: DC | PRN

## 2012-03-14 MED ORDER — FENTANYL CITRATE 0.05 MG/ML IJ SOLN
INTRAMUSCULAR | Status: DC | PRN
  Administered 2012-03-14: 50 ug via INTRAVENOUS

## 2012-03-14 SURGICAL SUPPLY — 35 items
CANISTER SUCTION 2500CC (MISCELLANEOUS) ×2 IMPLANT
CLOTH BEACON ORANGE TIMEOUT ST (SAFETY) ×2 IMPLANT
COVER SURGICAL LIGHT HANDLE (MISCELLANEOUS) ×2 IMPLANT
DRAPE PED LAPAROTOMY (DRAPES) ×2 IMPLANT
DRSG PAD ABDOMINAL 8X10 ST (GAUZE/BANDAGES/DRESSINGS) ×2 IMPLANT
ELECT REM PT RETURN 9FT ADLT (ELECTROSURGICAL) ×2
ELECTRODE REM PT RTRN 9FT ADLT (ELECTROSURGICAL) ×1 IMPLANT
GLOVE BIO SURGEON STRL SZ7.5 (GLOVE) ×2 IMPLANT
GLOVE BIOGEL M STRL SZ7.5 (GLOVE) ×2 IMPLANT
GLOVE BIOGEL PI IND STRL 7.0 (GLOVE) ×1 IMPLANT
GLOVE BIOGEL PI IND STRL 8 (GLOVE) ×1 IMPLANT
GLOVE BIOGEL PI INDICATOR 7.0 (GLOVE) ×1
GLOVE BIOGEL PI INDICATOR 8 (GLOVE) ×1
GLOVE SS BIOGEL STRL SZ 7 (GLOVE) ×1 IMPLANT
GLOVE SUPERSENSE BIOGEL SZ 7 (GLOVE) ×1
GOWN PREVENTION PLUS XLARGE (GOWN DISPOSABLE) ×2 IMPLANT
GOWN STRL NON-REIN LRG LVL3 (GOWN DISPOSABLE) ×6 IMPLANT
KIT BASIN OR (CUSTOM PROCEDURE TRAY) ×2 IMPLANT
KIT ROOM TURNOVER OR (KITS) ×2 IMPLANT
NEEDLE HYPO 25GX1X1/2 BEV (NEEDLE) ×2 IMPLANT
NS IRRIG 1000ML POUR BTL (IV SOLUTION) ×2 IMPLANT
PACK GENERAL/GYN (CUSTOM PROCEDURE TRAY) ×2 IMPLANT
PAD ARMBOARD 7.5X6 YLW CONV (MISCELLANEOUS) ×2 IMPLANT
SHEARS HARMONIC 9CM CVD (BLADE) ×2 IMPLANT
SPONGE GAUZE 4X4 12PLY (GAUZE/BANDAGES/DRESSINGS) ×2 IMPLANT
SPONGE HEMORRHOID 8X3CM (HEMOSTASIS) ×2 IMPLANT
SPONGE SURGIFOAM ABS GEL 100 (HEMOSTASIS) IMPLANT
SURGILUBE 2OZ TUBE FLIPTOP (MISCELLANEOUS) ×2 IMPLANT
SUT CHROMIC 3 0 SH 27 (SUTURE) IMPLANT
SUT VIC AB 3-0 SH 27 (SUTURE) ×1
SUT VIC AB 3-0 SH 27X BRD (SUTURE) ×1 IMPLANT
SYR CONTROL 10ML LL (SYRINGE) ×2 IMPLANT
TOWEL OR 17X24 6PK STRL BLUE (TOWEL DISPOSABLE) ×2 IMPLANT
TOWEL OR 17X26 10 PK STRL BLUE (TOWEL DISPOSABLE) ×2 IMPLANT
UNDERPAD 30X30 INCONTINENT (UNDERPADS AND DIAPERS) ×2 IMPLANT

## 2012-03-14 NOTE — Anesthesia Preprocedure Evaluation (Signed)
Anesthesia Evaluation  Patient identified by MRN, date of birth, ID band Patient awake    Reviewed: Allergy & Precautions, H&P , NPO status , Patient's Chart, lab work & pertinent test results  History of Anesthesia Complications (+) PONV  Airway Mallampati: II  Neck ROM: full    Dental   Pulmonary shortness of breath, former smoker,          Cardiovascular hypertension, + CAD and + CABG     Neuro/Psych Anxiety Depression    GI/Hepatic GERD-  ,  Endo/Other  Hypothyroidism   Renal/GU      Musculoskeletal  (+) Arthritis -,   Abdominal   Peds  Hematology   Anesthesia Other Findings   Reproductive/Obstetrics                           Anesthesia Physical Anesthesia Plan  ASA: III  Anesthesia Plan: General   Post-op Pain Management:    Induction: Intravenous  Airway Management Planned: LMA  Additional Equipment:   Intra-op Plan:   Post-operative Plan:   Informed Consent: I have reviewed the patients History and Physical, chart, labs and discussed the procedure including the risks, benefits and alternatives for the proposed anesthesia with the patient or authorized representative who has indicated his/her understanding and acceptance.     Plan Discussed with: CRNA and Surgeon  Anesthesia Plan Comments:         Anesthesia Quick Evaluation

## 2012-03-14 NOTE — Anesthesia Procedure Notes (Signed)
Procedure Name: Intubation Date/Time: 03/14/2012 9:52 AM Performed by: Jerilee Hoh Pre-anesthesia Checklist: Patient identified, Emergency Drugs available, Suction available and Patient being monitored Patient Re-evaluated:Patient Re-evaluated prior to inductionOxygen Delivery Method: Circle system utilized Preoxygenation: Pre-oxygenation with 100% oxygen Intubation Type: IV induction Ventilation: Mask ventilation without difficulty and Oral airway inserted - appropriate to patient size Laryngoscope Size: Mac and 3 Grade View: Grade I Tube type: Oral Tube size: 7.0 mm Number of attempts: 1 Airway Equipment and Method: Stylet Placement Confirmation: ETT inserted through vocal cords under direct vision,  positive ETCO2 and breath sounds checked- equal and bilateral Secured at: 21 cm Tube secured with: Tape Dental Injury: Teeth and Oropharynx as per pre-operative assessment

## 2012-03-14 NOTE — Telephone Encounter (Signed)
Clearance received and faxed to [facility] OR. Confirmation received. Dr Andrey Campanile paged and made aware.

## 2012-03-14 NOTE — Interval H&P Note (Signed)
History and Physical Interval Note:  03/14/2012 9:27 AM  Dawn Boyer  has presented today for surgery, with the diagnosis of bleeding hemorrhoids  The various methods of treatment have been discussed with the patient and family. After consideration of risks, benefits and other options for treatment, the patient has consented to  Procedure(s): EXAM UNDER ANESTHESIA WITH Excisional HEMORRHOIDECTOMY and Hemorrhoidal banding (N/A) as a surgical intervention .  The patient's history has been reviewed, patient examined, no change in status, stable for surgery.  I have reviewed the patient's chart and labs.  Questions were answered to the patient's satisfaction.  Cardiac stress test negative yesterday and cleared for surgery.  Mary Sella. Andrey Campanile, MD, FACS General, Bariatric, & Minimally Invasive Surgery [facility], [place]    [facility]

## 2012-03-14 NOTE — H&P (View-Only) (Signed)
Patient ID: Dawn Boyer, female   DOB: 03/14/1945, 67 y.o.   MRN: [id number]  Chief Complaint  Patient presents with  . New Evaluation    eval hems    HPI Dawn Boyer is a 67 y.o. female.   HPI 67-year-old Caucasian female referred by Dr. [name] Owens for evaluation of bleeding hemorrhoids. The patient states that she has had hemorrhoidal problems for 40 years ever since her birth of her son. She states that she has daily bowel movements however that is only with the aid of an enema. She uses enemas on a daily basis. She states every year she has tried numerous efforts for having regular bowel movements. She has tried laxatives as well as Metamucil all without any benefit. She denies any pain with defecation. Occasionally when one of the hemorrhoids pops out it'll cause a fair amount of discomfort and pain. Her main issue is significant bleeding. She states that she's actually become anemic because of her hemorrhoidal bleeding. She states that she's had numerous colonoscopies over the years all of which have been normal except for colonic polyps. I do not have a copy of these records. Occasionally the stools will be very hard. She only drinks about 2 glasses of water a day. She does tend to sit on the commode for a period of time. When her hemorrhoids do pop out and play her she will do sitz baths as well as Preparation H. She describes the bleeding as mainly in the toilet as well as mixed in the stool. She has had a prior sclerotherapy to her hemorrhoids about 13 years ago which worked for short term Past Medical History  Diagnosis Date  . Coronary artery disease   . Hypertension   . Hyperlipidemia   . History of hemolysis, elevated liver enzymes, and low platelet (HELLP) syndrome, currently pregnant   . Hemorrhoid   . Fibrocystic breast   . Insomnia   . Anxiety   . Depression   . Heart disease     Past Surgical History  Procedure Laterality Date  . Coronary artery bypass graft  1993  .  Tubal ligation  1970  . Hemorrhoid surgery  2001  . Cholecystectomy  2002    Family History  Problem Relation Age of Onset  . Heart disease Sister   . Heart disease Brother     Social History History  Substance Use Topics  . Smoking status: Former Smoker    Quit date: 11/21/1982  . Smokeless tobacco: Never Used  . Alcohol Use: No    No Known Allergies  Current Outpatient Prescriptions  Medication Sig Dispense Refill  . ALPRAZolam (XANAX) 0.25 MG tablet Take 0.25 mg by mouth at bedtime as needed for sleep.      . aspirin 325 MG tablet Take 325 mg by mouth daily.      . atorvastatin (LIPITOR) 80 MG tablet Take 80 mg by mouth daily.      . Calcium Carbonate (CALCIUM 600 PO) Take 1 tablet by mouth daily.      . Cholecalciferol (VITAMIN D-3) 1000 UNITS CAPS Take by mouth daily.      . esomeprazole (NEXIUM) 40 MG capsule Take 40 mg by mouth daily before breakfast.      . ezetimibe (ZETIA) 10 MG tablet Take 10 mg by mouth daily.      . FeFum-FePo-FA-B Cmp-C-Zn-Mn-Cu (SE-TAN PLUS) 162-115.2-1 MG CAPS Take by mouth.      . levothyroxine (SYNTHROID, LEVOTHROID) 50 MCG tablet   Take 50 mcg by mouth daily.      . metoprolol succinate (TOPROL-XL) 50 MG 24 hr tablet Take 50 mg by mouth daily. Take with or immediately following a meal.      . phosphate (FLEET) enema Place 1 enema rectally once as needed for constipation.      . quinapril (ACCUPRIL) 20 MG tablet Take 20 mg by mouth at bedtime.      . sertraline (ZOLOFT) 50 MG tablet Take 50 mg by mouth daily.      . vitamin E 400 UNIT capsule Take 400 Units by mouth daily.       No current facility-administered medications for this visit.    Review of Systems Review of Systems  Constitutional: Negative for fever, chills and unexpected weight change.  HENT: Negative for hearing loss, congestion, sore throat, trouble swallowing and voice change.   Eyes: Negative for visual disturbance.  Respiratory: Negative for cough and wheezing.    Cardiovascular: Negative for chest pain, palpitations and leg swelling.       Denies CP, DOE, SOB, PND  Gastrointestinal: Positive for constipation and anal bleeding. Negative for nausea, vomiting, abdominal pain, diarrhea, blood in stool and abdominal distention.  Genitourinary: Negative for hematuria, vaginal bleeding and difficulty urinating.  Musculoskeletal: Negative for arthralgias.       + joint pain  Skin: Negative for rash and wound.  Neurological: Positive for dizziness. Negative for seizures, syncope and headaches.  Hematological: Negative for adenopathy. Does not bruise/bleed easily.  Psychiatric/Behavioral: Negative for confusion.    Blood pressure 134/78, pulse 78, temperature 98.4 F (36.9 C), temperature source Temporal, resp. rate 18, height 5' 4" (1.626 m), weight 140 lb 3.2 oz (63.594 kg).  Physical Exam Physical Exam  Vitals reviewed. Constitutional: She is oriented to person, place, and time. She appears well-developed and well-nourished. No distress.  HENT:  Head: Normocephalic and atraumatic.  Right Ear: External ear normal.  Left Ear: External ear normal.  Eyes: Conjunctivae are normal. No scleral icterus.  Neck: Normal range of motion. Neck supple. No tracheal deviation present. No thyromegaly present.  Cardiovascular: Normal rate, regular rhythm and normal heart sounds.   Pulmonary/Chest: Effort normal and breath sounds normal. No respiratory distress. She has no wheezes. She has no rales.  Abdominal: Soft. She exhibits no distension. There is no tenderness. There is no rebound and no guarding.  Well healed trocar sites  Genitourinary: Rectal exam shows external hemorrhoid and internal hemorrhoid. Rectal exam shows no fissure and anal tone normal.  Circumferential nonthrombosed redundant external hemorrhoidal tissue. A little diminished rectal tone. Patient has a partially prolapsed large left-sided internal hemorrhoid. Anoscopy showed a large left-sided  Somewhat friable internal hemorrhoid; Right anterior and posterior internal hemorrhoids. The right posterior hemorrhoid was larger and more friable appearing than the anterior hemorrhoid  Musculoskeletal: She exhibits no edema and no tenderness.  Lymphadenopathy:    She has no cervical adenopathy.  Neurological: She is alert and oriented to person, place, and time.  Skin: Skin is warm and dry. No rash noted. She is not diaphoretic. No erythema.  Psychiatric: She has a normal mood and affect. Her behavior is normal. Judgment and thought content normal.    Data Reviewed Dr Owen's note 2/6  Assessment    Circumferential non-thrombosed external hemorrhoids Large partially prolapsed left internal hemorrhoid Rt anterior and posterior internal hemorrhoids (grade 1 and 2)     Plan    We discussed the etiology of hemorrhoids. The patient was given   educational material as well as diagrams. We discussed nonoperative and operative management of hemorrhoidal disease.  We discussed the importance of having a daily soft bowel movement and avoiding constipation. We also discussed good bowel habits such as not reading in the bathroom, not straining, and drinking 6-8 glasses of water per day. We also discussed the importance of a high fiber diet. We discussed foods that were high in fiber as well as fiber supplements. We discussed the importance of trying to get 25-30 g of fiber per day in their diet. We discussed the need to start with a low dose of fiber and then gradually increasing their daily fiber dose over several weeks in order to avoid bloating and cramping.  We then discussed different surgical techniques for hemorrhoids, specifically hemorrhoidal banding and excisional hemorrhoidectomy.  PLAN: The patient has chronic issues with bowel movements. I am not sure if increasing her water intake and increasing her fiber supplement likely correct her underlying bowel motility. Nonetheless I did highly  encourage her to increase her water intake as well as to slowly add a fiber supplement to her diet such as Benefiber or Metamucil. Even with Addition of the strategies I think she needs and  would benefit from hemorrhoidectomy and hemorrhoidal banding.  I believe the left-sided hemorrhoid would only be a candidate for an excisional hemorrhoidectomy. Wears a right-sided hemorrhoids are a candidate for hemorrhoidal banding.  I discussed the procedure in detail.  The patient was given educational material.  We discussed the risks and benefits of surgery including, but not limited to bleeding, infection, blood clot formation, anesthesia risk, urinary retention, hemorrhoid recurrence, injury to the sphincters resulting in incontinence, and the rare possibility of anal canal narrowing. I explained that the likelihood of improvement of their symptoms is fair  We discussed the typical postoperative course.  I stressed the importance of not becoming constipated after surgery.  The patient was encouraged to limit pain medication if possible as this increases the likelihood of becoming constipated. The patient was advised to take stool softners & drink 8-10 glasses of non-carbonated, non-alcoholic beverages per day and to eat a high fiber diet.  I also encouraged soaking in a water warm bath for 15 minutes at a time several times a day and after a bowel movement.  The patient was advised to take laxatives such as milk of magnesia or Miralax if no bowel movement three days after surgery.  The patient was advised to expect some blood tinged drainage as well as some blood in their bowel movements.    I explained to the patient that she is at an increased risk for hemorrhoidal recurrence due to her underlying bowel habits. She would like to proceed with surgery. She will be scheduled for exam under anesthesia, excisional hemorrhoidectomy and hemorrhoidal banding in the near future  Balin Vandegrift M. Reid Regas, MD, FACS General,  Bariatric, & Minimally Invasive Surgery [facility]        Javarie Crisp M 02/29/2012, 3:35 PM    

## 2012-03-14 NOTE — Transfer of Care (Signed)
Immediate Anesthesia Transfer of Care Note  Patient: Dawn Boyer  Procedure(s) Performed: Procedure(s) with comments: EXAM UNDER ANESTHESIA WITH Excisional HEMORRHOIDECTOMY and Hemorrhoidal banding (N/A) - x 2 hemorrhoids  Patient Location: PACU  Anesthesia Type:General  Level of Consciousness: awake, alert , oriented and patient cooperative  Airway & Oxygen Therapy: Patient Spontanous Breathing and Patient connected to nasal cannula oxygen  Post-op Assessment: Report given to PACU RN, Post -op Vital signs reviewed and stable and Patient moving all extremities  Post vital signs: Reviewed and stable  Complications: No apparent anesthesia complications

## 2012-03-14 NOTE — Progress Notes (Signed)
Called Dr. Hoyle Barr office and requested results of stress test that was done yesterday afternoon to be faxed ASAP.

## 2012-03-14 NOTE — Anesthesia Postprocedure Evaluation (Signed)
Anesthesia Post Note  Patient: Dawn Boyer  Procedure(s) Performed: Procedure(s) (LRB): EXAM UNDER ANESTHESIA WITH Excisional HEMORRHOIDECTOMY and Hemorrhoidal banding (N/A)  Anesthesia type: General  Patient location: PACU  Post pain: Pain level controlled and Adequate analgesia  Post assessment: Post-op Vital signs reviewed, Patient's Cardiovascular Status Stable, Respiratory Function Stable, Patent Airway and Pain level controlled  Last Vitals:  Filed Vitals:   03/14/12 1208  BP: 163/83  Pulse: 68  Temp: 36.1 C  Resp: 14    Post vital signs: Reviewed and stable  Level of consciousness: awake, alert  and oriented  Complications: No apparent anesthesia complications

## 2012-03-14 NOTE — Preoperative (Signed)
Beta Blockers   Reason not to administer Beta Blockers:Not Applicable 

## 2012-03-14 NOTE — Brief Op Note (Signed)
03/14/2012  10:45 AM  PATIENT:  Dawn Boyer  67 y.o. female  PRE-OPERATIVE DIAGNOSIS:  Bleeding internal/external hemorrhoids  POST-OPERATIVE DIAGNOSIS:  same  PROCEDURE:  Procedure(s) with comments: EXAM UNDER ANESTHESIA WITH Excisional HEMORRHOIDECTOMY  x2 and Hemorrhoidal banding (N/A) - x 1 hemorrhoids  SURGEON:  Surgeon(s) and Role:    * Atilano Ina, MD - Primary  PHYSICIAN ASSISTANT: none  ASSISTANTS: none   ANESTHESIA:   general and 20cc exparel  EBL:  Total I/O In: 1000 [I.V.:1000] Out: -   BLOOD ADMINISTERED:none  DRAINS: none   LOCAL MEDICATIONS USED:  OTHER exparel  SPECIMEN:  Source of Specimen:  internal/external hemorrhoids x 2  DISPOSITION OF SPECIMEN:  PATHOLOGY  COUNTS:  YES  TOURNIQUET:  * No tourniquets in log *  DICTATION: .Other Dictation: Dictation Number [id number]  PLAN OF CARE: Discharge to home after PACU  PATIENT DISPOSITION:  PACU - hemodynamically stable.   Delay start of Pharmacological VTE agent (>24hrs) due to surgical blood loss or risk of bleeding: not applicable  Mary Sella. Andrey Campanile, MD, FACS General, Bariatric, & Minimally Invasive Surgery [facility], [place]

## 2012-03-15 NOTE — Op Note (Signed)
Dawn Boyer, Dawn Boyer                  ACCOUNT NO.:  [id number]  MEDICAL RECORD NO.:  [id number]  LOCATION:  [facility]                         FACILITY:  [facility]  PHYSICIAN:  Mary Sella. Andrey Campanile, MD, FACSDATE OF BIRTH:  [birth year]  DATE OF PROCEDURE:  03/14/2012 DATE OF DISCHARGE:  03/14/2012                              OPERATIVE REPORT   PREOPERATIVE DIAGNOSIS:  Bleeding, internal-external hemorrhoids.  POSTOPERATIVE DIAGNOSIS:  Bleeding, internal-external hemorrhoids.  PROCEDURE: 1. Exam under anesthesia. 2. Excisional hemorrhoidectomy of right lateral and left lateral     internal-external hemorrhoid. 3. Hemorrhoidal banding of anterior internal hemorrhoid.  SURGEON:  Mary Sella. Andrey Campanile, MD, FACS  ASSISTANT SURGEON:  None.  ANESTHESIA:  General plus 20 mL of Exparel.  SPECIMEN:  Internal and external hemorrhoid x2.  INDICATIONS FOR PROCEDURE:  The patient is a very pleasant 67 year old female who has had problems with bowel movements for many years and actually uses a daily enema, but her main problem is bleeding hemorrhoids.  On physical exam, she was found to have several clusters of internal hemorrhoids as well as a circumferential non-thrombosed external hemorrhoid tissue.  We discussed the pros and cons of surgery as well as hemorrhoidal banding and excisional hemorrhoidectomy.  We discussed the typical risks and benefits of surgery including, but not limited to, bleeding, infection, injury to surrounding structures such as the underlying sphincter, hemorrhoidal recurrence, blood clot formation, urinary retention, anesthesia concerns as well as the typical postop recovery course.  She elected to proceed with surgery.  DESCRIPTION OF PROCEDURE:  After obtaining informed consent, she was taken to the operating room to the [facility]. General endotracheal anesthesia was established.  Sequential compression devices were placed.  She was then placed in the prone  Jackknife position with the appropriate padding.  Her buttocks were taped apart. Her buttocks and perineum and anus was reprepped with Betadine.  She received IV antibiotics.  A surgical time-out was performed.  On gross inspection, she had circumferential redundant non-thrombosed external hemorrhoidal tissue.  Digital rectal exam was performed followed by anoscopy.  She had a large right lateral internal/external hemorrhoid that was friable.  She also had a large left lateral internal/external-internal hemorrhoid.  She had a small internal hemorrhoid in the anterior midline.  Based on the size of the 2 lateral hemorrhoids, I felt that excisional hemorrhoidectomy was the best.  I started on the left by injecting with 0.25% Marcaine was infiltrated along the anoderm for hemostasis.  I then made a V-shaped incision with a #15 blade.  Then using hemostat, I lifted the mucosa and submucosa up from the underlying sphincter.  I then excised the internal and external hemorrhoid in an elliptical fashion with a Harmonic scalpel.  Hemostasis was achieved.  I then switched to the right side, again infiltrated local around the anoderm around the base of the external hemorrhoidal tissue.  Then made a V-shaped incision with a 15 blade.  Then, again in similar fashion within mucosa and submucosa off of the underlying sphincter and excised the large right internal and external hemorrhoid in elliptical fashion using the Harmonic scalpel.  There was a little bit of bleeding from the  apex near the dentate line and this was taken care with 3-0 Vicryl LigaSure.  There was no additional bleeding.  With respect to the anterior midline internal hemorrhoid, two rubber bands were placed around the base of the internal hemorrhoid.  A piece of Gelfoam was placed in the patient's rectum.  I then infiltrated 20 mL of Exparel in the regional fashion.  4x4, ABDs, and mesh underwear were then applied.  She was placed in  the supine position, extubated, and taken to recovery room in stable addition.  All needle, instrument, sponge counts were correct x2.  There were no immediate complications. The patient tolerated procedure well.     Mary Sella. Andrey Campanile, MD, FACS     EMW/MEDQ  D:  03/14/2012  T:  03/15/2012  Job:  [id number]  cc:   Claude Manges, NP Corky Crafts, MD

## 2012-03-16 ENCOUNTER — Encounter (HOSPITAL_COMMUNITY): Payer: Self-pay | Admitting: General Surgery

## 2012-03-18 ENCOUNTER — Telehealth (INDEPENDENT_AMBULATORY_CARE_PROVIDER_SITE_OTHER): Payer: Self-pay | Admitting: General Surgery

## 2012-03-18 NOTE — Telephone Encounter (Signed)
Patient called status post hemorrhoidectomy and she has barely had a bowel movement. She has been taking metamucil and colace. She was calling to see if she could use a dulcolax suppository. I advised patient not to use any suppositories right after surgery. I advised her to try milk of magnesia and drink plenty of fluids. She will let us know if this does not help.

## 2012-04-10 ENCOUNTER — Ambulatory Visit (INDEPENDENT_AMBULATORY_CARE_PROVIDER_SITE_OTHER): Payer: Medicare Other | Admitting: General Surgery

## 2012-04-10 ENCOUNTER — Encounter (INDEPENDENT_AMBULATORY_CARE_PROVIDER_SITE_OTHER): Payer: Self-pay | Admitting: General Surgery

## 2012-04-10 VITALS — BP 122/83 | HR 70 | Temp 98.6°F | Resp 12 | Ht 64.5 in | Wt 139.8 lb

## 2012-04-10 DIAGNOSIS — Z09 Encounter for follow-up examination after completed treatment for conditions other than malignant neoplasm: Secondary | ICD-10-CM

## 2012-04-10 NOTE — Progress Notes (Signed)
Subjective:     Patient ID: Dawn Boyer, female   DOB: 1945/04/04, 67 y.o.   MRN: [id number]  HPI  67 year old Caucasian female comes in for her first postoperative appointment after undergoing exam under anesthesia, open excisional hemorrhoidectomy of right lateral and left lateral internal-external hemorrhoids as well as banding of anterior internal hemorrhoid on March 13. She states that she did well after surgery. She had minimal discomfort. She had a little bit of bleeding as expected but is no longer having any bleeding. She did have some problems with constipation after surgery. Prior to surgery the patient was using enema on a daily basis for many many years. Review of Systems     Objective:   Physical Exam BP 122/83  Pulse 70  Temp(Src) 98.6 F (37 C) (Temporal)  Resp 12  Ht 5' 4.5" (1.638 m)  Wt 139 lb 12.8 oz (63.413 kg)  BMI 23.63 kg/m2 Alert, nad, nontoxic abd soft, nt Rectal - Circumferential nonthrombosed redundant external hemorrhoidal tissue. Left lateral and right lateral incisions are healing well. No signs of infection. Digital rectal exam and anoscopy deferred today    Assessment:     Status post exam under anesthesia, open excisional hemorrhoidectomy of right lateral and left lateral internal-external hemorrhoids and anterior internal hemorrhoidal banding     Plan:     Overall I think she is doing well. She still has some swelling of her hemorrhoidal tissue. We will do a digital rectal exam and anoscopy on her subsequent followup visit. I'm very pleased that she is no longer having to use enemas on a daily basis. We discussed the importance of increasing the fiber in her diet and we discussed strategies to do that. Followup 8 weeks.   Mary Sella. Andrey Campanile, MD, FACS General, Bariatric, & Minimally Invasive Surgery [facility], [place]

## 2012-04-10 NOTE — Patient Instructions (Signed)
Drink 6-8 glasses of water per day Get a fiber supplement like Metamucil or Benefiber - gummies are ok. You want to get up to 25 grams of fiber per day. You need to slowly increase the fiber in your diet in order to avoid bloating, cramping

## 2012-04-26 ENCOUNTER — Telehealth (INDEPENDENT_AMBULATORY_CARE_PROVIDER_SITE_OTHER): Payer: Self-pay

## 2012-04-26 NOTE — Telephone Encounter (Signed)
Request for refill of Norco received via fax. Norco 5/325 #[phone] with no refills approved per Dr Magnus Ivan in Dr Tawana Scale absence and called to [facility]. [phone].

## 2012-06-06 ENCOUNTER — Ambulatory Visit (INDEPENDENT_AMBULATORY_CARE_PROVIDER_SITE_OTHER): Payer: Medicare Other | Admitting: General Surgery

## 2012-06-06 ENCOUNTER — Encounter (INDEPENDENT_AMBULATORY_CARE_PROVIDER_SITE_OTHER): Payer: Self-pay | Admitting: General Surgery

## 2012-06-06 VITALS — BP 142/84 | HR 68 | Temp 97.8°F | Resp 16 | Ht 64.0 in | Wt 142.6 lb

## 2012-06-06 DIAGNOSIS — Z09 Encounter for follow-up examination after completed treatment for conditions other than malignant neoplasm: Secondary | ICD-10-CM

## 2012-06-06 NOTE — Patient Instructions (Signed)
Keep up with the fiber and water

## 2012-06-06 NOTE — Progress Notes (Signed)
Subjective:     Patient ID: Dawn Boyer, female   DOB: 10-07-1945, 67 y.o.   MRN: [id number]  HPI 67 year old Caucasian female comes in for followup after undergoing exam under anesthesia, excisional hemorrhoidectomy of the left and right side as well as an anterior hemorrhoidal banding in March 2014. This is her second postoperative visit. She states that she is doing really well. She denies any pain with defecation. She no longer has any bleeding. She is no longer having to wear any pads like she was before surgery. She denies any itching or burning. She is back to using enemas however. She is still taking stool softeners and supplemental fiber.  Review of Systems     Objective:   Physical Exam BP 142/84  Pulse 68  Temp(Src) 97.8 F (36.6 C) (Temporal)  Resp 16  Ht 5\' 4"  (1.626 m)  Wt 142 lb 9.6 oz (64.683 kg)  BMI 24.47 kg/m2 Alert, no apparent distress Rectal-visual inspection reveals redundant nonthrombosed external hemorrhoidal tissue on the left and right side. Digital rectal exam reveals good tone. Incisions are completely closed.    Assessment:     Status post exam under anesthesia, open excisional hemorrhoidectomy the left and right lateral hemorrhoids, banding of anterior internal hemorrhoid     Plan:     Overall she is doing well. As expected she still has some persistent redundant external hemorrhoidal tissue. She was encouraged to continue with drinking 6-8 glasses of water a day as well as supplemental fiber. We discussed the importance of this in order to decrease her chance of having future hemorrhoidal problems. Followup as needed  Mary Sella. Andrey Campanile, MD, FACS General, Bariatric, & Minimally Invasive Surgery [facility], [place]

## 2012-08-12 ENCOUNTER — Emergency Department: Payer: Self-pay | Admitting: Emergency Medicine

## 2012-10-01 ENCOUNTER — Encounter (INDEPENDENT_AMBULATORY_CARE_PROVIDER_SITE_OTHER): Payer: Self-pay

## 2013-03-04 ENCOUNTER — Encounter: Payer: Self-pay | Admitting: Interventional Cardiology

## 2013-03-04 ENCOUNTER — Other Ambulatory Visit: Payer: Self-pay | Admitting: Family Medicine

## 2013-03-04 DIAGNOSIS — I2510 Atherosclerotic heart disease of native coronary artery without angina pectoris: Secondary | ICD-10-CM

## 2013-03-04 DIAGNOSIS — R0989 Other specified symptoms and signs involving the circulatory and respiratory systems: Secondary | ICD-10-CM

## 2013-03-04 DIAGNOSIS — E785 Hyperlipidemia, unspecified: Secondary | ICD-10-CM

## 2013-03-11 ENCOUNTER — Ambulatory Visit
Admission: RE | Admit: 2013-03-11 | Discharge: 2013-03-11 | Disposition: A | Discharge status: Home / Self Care | Payer: Commercial Managed Care - HMO | Source: Ambulatory Visit | Attending: Family Medicine | Admitting: Family Medicine

## 2013-03-11 DIAGNOSIS — R0989 Other specified symptoms and signs involving the circulatory and respiratory systems: Secondary | ICD-10-CM

## 2013-03-11 DIAGNOSIS — E785 Hyperlipidemia, unspecified: Secondary | ICD-10-CM

## 2013-03-11 DIAGNOSIS — I2510 Atherosclerotic heart disease of native coronary artery without angina pectoris: Secondary | ICD-10-CM

## 2013-09-01 ENCOUNTER — Encounter: Payer: Self-pay | Admitting: Internal Medicine

## 2013-10-02 ENCOUNTER — Ambulatory Visit (INDEPENDENT_AMBULATORY_CARE_PROVIDER_SITE_OTHER): Payer: Medicare Other | Admitting: Interventional Cardiology

## 2013-10-02 ENCOUNTER — Encounter: Payer: Self-pay | Admitting: Interventional Cardiology

## 2013-10-02 VITALS — BP 158/98 | HR 73 | Ht 64.5 in | Wt 143.4 lb

## 2013-10-02 DIAGNOSIS — E782 Mixed hyperlipidemia: Secondary | ICD-10-CM | POA: Insufficient documentation

## 2013-10-02 DIAGNOSIS — I10 Essential (primary) hypertension: Secondary | ICD-10-CM | POA: Insufficient documentation

## 2013-10-02 DIAGNOSIS — I2510 Atherosclerotic heart disease of native coronary artery without angina pectoris: Secondary | ICD-10-CM

## 2013-10-02 NOTE — Progress Notes (Signed)
Patient ID: Dawn Boyer, female   DOB: 12/03/1945, 68 y.o.   MRN: [id number]    [address] [place], [place]  [address] Phone: [phone] Fax:  [phone]  Date:  10/02/2013   ID:  Dawn Boyer, DOB 06/06/1945, MRN [id number]  PCP:  Dawn MangesWENS,[name], [name]      History of Present Illness: Dawn Boyer is a 68 y.o. female who had CABG in 1994. She had throat tightness with walking. She had great difficulty walking. Currently, she has occasionally chest tightness with anxiety. She walks regularly and does not have any anginal sx. CAD/ASCVD:  Denies : Dizziness. Dyspnea on exertion. Nitroglycerin. Orthopnea. Palpitations. Syncope.  Had general anesthesia last year without problems.    Wt Readings from Last 3 Encounters:  10/02/13 143 lb 6.4 oz (65.046 kg)  06/06/12 142 lb 9.6 oz (64.683 kg)  04/10/12 139 lb 12.8 oz (63.413 kg)     Past Medical History  Diagnosis Date  . Coronary artery disease   . Hypertension   . Hyperlipidemia   . History of hemolysis, elevated liver enzymes, and low platelet (HELLP) syndrome, currently pregnant     patient denies history  . Hemorrhoid   . Fibrocystic breast   . Insomnia   . Anxiety   . Depression   . Heart disease   . PONV (postoperative nausea and vomiting)     after heart surgery  . Hypothyroidism   . Shortness of breath     exersion  . Stones in the urinary tract   . GERD (gastroesophageal reflux disease)   . Arthritis   . Anemia     hx  . Hemorrhoids     Current Outpatient Prescriptions  Medication Sig Dispense Refill  . ALPRAZolam (XANAX) 0.25 MG tablet Take 1 mg by mouth at bedtime as needed for sleep.       Marland Kitchen. aspirin 325 MG tablet Take 325 mg by mouth daily.      Marland Kitchen. atorvastatin (LIPITOR) 80 MG tablet Take 80 mg by mouth daily.      . Calcium Carbonate (CALCIUM 600 PO) Take 1 tablet by mouth daily.      Marland Kitchen. ezetimibe (ZETIA) 10 MG tablet Take 10 mg by mouth daily.      Marland Kitchen. FeFum-FePo-FA-B Cmp-C-Zn-Mn-Cu (SE-TAN  PLUS) 162-115.2-1 MG CAPS Take 1 tablet by mouth daily.       Marland Kitchen. levothyroxine (SYNTHROID, LEVOTHROID) 50 MCG tablet Take 50 mcg by mouth daily.      . metoprolol succinate (TOPROL-XL) 50 MG 24 hr tablet Take 25 mg by mouth 2 (two) times daily. Take with or immediately following a meal.      . omeprazole (PRILOSEC) 40 MG capsule daily.      . quinapril (ACCUPRIL) 20 MG tablet Take 40 mg by mouth at bedtime. Take to tabs twice daily      . sertraline (ZOLOFT) 50 MG tablet Take 50 mg by mouth 3 (three) times daily.       . traZODone (DESYREL) 50 MG tablet Take 50 mg by mouth at bedtime.       No current facility-administered medications for this visit.    Allergies:   No Known Allergies  Social History:  The patient  reports that she quit smoking about 30 years ago. Her smoking use included Cigarettes. She has a 12 pack-year smoking history. She has never used smokeless tobacco. She reports that she does not drink alcohol or use illicit drugs.  Family History:  The patient's family history includes Heart disease in her brother and sister.   ROS:  Please see the history of present illness.  No nausea, vomiting.  No fevers, chills.  No focal weakness.  No dysuria.    All other systems reviewed and negative.   PHYSICAL EXAM: VS:  BP 158/98  Pulse 73  Ht 5' 4.5" (1.638 m)  Wt 143 lb 6.4 oz (65.046 kg)  BMI 24.24 kg/m2 Well nourished, well developed, in no acute distress HEENT: normal Neck: no JVD, no carotid bruits Cardiac:  normal S1, S2; RRR;  Lungs:  clear to auscultation bilaterally, no wheezing, rhonchi or rales Abd: soft, nontender, no hepatomegaly Ext: no edema Skin: warm and dry Neuro:   no focal abnormalities noted  EKG:  NSR, inferior and lateral ST depression    ASSESSMENT AND PLAN:  Coronary atherosclerosis of native coronary artery  Continue Aspirin Tablet, 325 MG, 1 tablet, Orally, Once a day Diagnostic Imaging:EKG Dawn Boyer 12/05/2010 11:27:57 AM > Dawn Boyer,Dawn Boyer  12/05/2010 11:52:05 AM > NSR, PVC, lateral ST segment changes  No angina. No change on ECG.  Negative stress test in 2014 pre surgery.   2. Essential hypertension, benign  PMD Recently increased Accupril Tablet, 20 mg, 1 capsule, Orally, BID  Would suggest adding amlodipne 5 mg daily if BP still up with PMD at recheck   3. Mixed hyperlipidemia  Continue Lipitor Tablet, 80 MG, 1 tablet, Orally, Once a day Restarting Zetia Tablet, 10 MG, 1 tablet, Orally, Once a day LDL 81 in 4/12.  LDL 99 in 9/15.  TG 477.  Was off Zetia due to cost.  Will check with lipid clinic regarding whether we should add a fibrate for TG.  She had excessive belching with fish oil in the past.     Preventive Medicine  Adult topics discussed:  Diet: discussed a low fat, low cholesterol, low sugar diet.  Exercise: 5 days a week, at least 30 minutes of aerobic exercise.      Signed, Fredric Mare, MD, [facility] 10/02/2013 12:33 PM

## 2013-10-02 NOTE — Addendum Note (Signed)
Addended byOrlene Plum: Lukas Pelcher H on: 10/02/2013 01:25 PM   Modules accepted: Orders

## 2013-10-02 NOTE — Patient Instructions (Addendum)
Your physician has recommended you make the following change in your medication:   1. Start taking Zetia 10 mg 1 tablet daily.   Your physician recommends that you return for a FASTING lipid and hepatic panel 01/05/14.   Your physician wants you to follow-up in: 1 year with Dr. Eldridge DaceVaranasi. You will receive a reminder letter in the mail two months in advance. If you don't receive a letter, please call our office to schedule the follow-up appointment.

## 2013-10-07 ENCOUNTER — Telehealth: Payer: Self-pay | Admitting: Interventional Cardiology

## 2013-10-07 NOTE — Telephone Encounter (Signed)
Spoke with pt and let her know that I did call her last week about restarting zetia. Pt will monitor BP per Dr. Hoyle BarrVaranasi's OV note ans call if she has elevated readings.

## 2013-10-07 NOTE — Telephone Encounter (Signed)
New message    Patient calling  Stating someone called her they left her number to the office

## 2013-10-27 ENCOUNTER — Encounter: Payer: Self-pay | Admitting: Internal Medicine

## 2013-11-13 ENCOUNTER — Ambulatory Visit (AMBULATORY_SURGERY_CENTER): Payer: Self-pay | Admitting: *Deleted

## 2013-11-13 VITALS — Ht 64.5 in | Wt 139.2 lb

## 2013-11-13 DIAGNOSIS — Z86010 Personal history of colonic polyps: Secondary | ICD-10-CM

## 2013-11-13 MED ORDER — MOVIPREP 100 G PO SOLR: 1.0000 | Freq: Once | ORAL | Status: DC

## 2013-11-13 NOTE — Progress Notes (Signed)
Denies allergies to eggs or soy products. Denies complications with sedation or anesthesia. Denies O2 use. Denies use of diet or weight loss medications.  Emmi instructions given for colonoscopy.  

## 2013-12-12 ENCOUNTER — Encounter: Payer: Self-pay | Admitting: Internal Medicine

## 2013-12-12 ENCOUNTER — Ambulatory Visit (AMBULATORY_SURGERY_CENTER): Payer: Commercial Managed Care - HMO | Admitting: Internal Medicine

## 2013-12-12 ENCOUNTER — Other Ambulatory Visit: Payer: Self-pay

## 2013-12-12 ENCOUNTER — Telehealth: Payer: Self-pay

## 2013-12-12 VITALS — BP 161/80 | HR 66 | Temp 98.0°F | Resp 20 | Ht 64.5 in | Wt 139.0 lb

## 2013-12-12 DIAGNOSIS — Z86010 Personal history of colonic polyps: Secondary | ICD-10-CM

## 2013-12-12 MED ORDER — SODIUM CHLORIDE 0.9 % IV SOLN: 500.0000 mL | INTRAVENOUS | Status: DC

## 2013-12-12 MED ORDER — LINACLOTIDE 290 MCG PO CAPS: 290.0000 ug | ORAL_CAPSULE | Freq: Every day | ORAL | Status: DC

## 2013-12-12 NOTE — Patient Instructions (Signed)
YOU HAD AN ENDOSCOPIC PROCEDURE TODAY AT THE [facility]: Refer to the procedure report that was given to you for any specific questions about what was found during the examination.  If the procedure report does not answer your questions, please call your gastroenterologist to clarify.  If you requested that your care partner not be given the details of your procedure findings, then the procedure report has been included in a sealed envelope for you to review at your convenience later.  YOU SHOULD EXPECT: Some feelings of bloating in the abdomen. Passage of more gas than usual.  Walking can help get rid of the air that was put into your GI tract during the procedure and reduce the bloating. If you had a lower endoscopy (such as a colonoscopy or flexible sigmoidoscopy) you may notice spotting of blood in your stool or on the toilet paper. If you underwent a bowel prep for your procedure, then you may not have a normal bowel movement for a few days.  DIET: Your first meal following the procedure should be a light meal and then it is ok to progress to your normal diet.  A half-sandwich or bowl of soup is an example of a good first meal.  Heavy or fried foods are harder to digest and may make you feel nauseous or bloated.  Likewise meals heavy in dairy and vegetables can cause extra gas to form and this can also increase the bloating.  Drink plenty of fluids but you should avoid alcoholic beverages for 24 hours.  ACTIVITY: Your care partner should take you home directly after the procedure.  You should plan to take it easy, moving slowly for the rest of the day.  You can resume normal activity the day after the procedure however you should NOT DRIVE or use heavy machinery for 24 hours (because of the sedation medicines used during the test).    SYMPTOMS TO REPORT IMMEDIATELY: A gastroenterologist can be reached at any hour.  During normal business hours, 8:30 AM to 5:00 PM Monday through Friday,  call [phone].  After hours and on weekends, please call the GI answering service at [phone] who will take a message and have the physician on call contact you.   Following lower endoscopy (colonoscopy or flexible sigmoidoscopy):  Excessive amounts of blood in the stool  Significant tenderness or worsening of abdominal pains  Swelling of the abdomen that is new, acute  Fever of 100F or higher   FOLLOW UP: If any biopsies were taken you will be contacted by phone or by letter within the next 1-3 weeks.  Call your gastroenterologist if you have not heard about the biopsies in 3 weeks.  Our staff will call the home number listed on your records the next business day following your procedure to check on you and address any questions or concerns that you may have at that time regarding the information given to you following your procedure. This is a courtesy call and so if there is no answer at the home number and we have not heard from you through the emergency physician on call, we will assume that you have returned to your regular daily activities without incident.  SIGNATURES/CONFIDENTIALITY: You and/or your care partner have signed paperwork which will be entered into your electronic medical record.  These signatures attest to the fact that that the information above on your After Visit Summary has been reviewed and is understood.  Full responsibility of the confidentiality of   this discharge information lies with you and/or your care-partner.  Diverticulosis and high fiber diet information given.  Next colonoscopy 10 years-2025. 

## 2013-12-12 NOTE — Telephone Encounter (Signed)
Script sent to pharmacy.

## 2013-12-12 NOTE — Progress Notes (Signed)
Pt stable to RR 

## 2013-12-12 NOTE — Telephone Encounter (Signed)
-----   Message from Beverley FiedlerJay M Pyrtle, MD sent at 12/12/2013  2:33 PM EST ----- She wants to try linzess 290 mcg daily for chronic constipation Thanks JMP

## 2013-12-12 NOTE — Op Note (Signed)
[facility] [address] [place], [id number]   COLONOSCOPY PROCEDURE REPORT  PATIENT: Dawn Boyer, Dawn Boyer  MR#: [id number] BIRTHDATE: Jan 21, 1945 , 68  yrs. old GENDER: female ENDOSCOPIST: Beverley FiedlerJay M Brodie Scovell, MD PROCEDURE DATE:  12/12/2013 PROCEDURE:   Colonoscopy, surveillance First Screening Colonoscopy - Avg.  risk and is 50 yrs.  old or older - No.  Prior Negative Screening - Now for repeat screening. N/A  History of Adenoma - Now for follow-up colonoscopy & has been > or = to 3 yrs.  Yes hx of adenoma.  Has been 3 or more years since last colonoscopy.  Polyps Removed Today? No.  Polyps Removed Today? No.  Recommend repeat exam, <10 yrs? Polyps Removed Today? No.  Recommend repeat exam, <10 yrs? No. ASA CLASS:   Class II INDICATIONS:high risk personal history of colonic polyps and last colonoscopy completed  2010 Jarold Motto([place], 1 sigmoid adenoma). MEDICATIONS: Monitored anesthesia care and Propofol 200 mg IV, lidocaine 40 mg IV  DESCRIPTION OF PROCEDURE:   After the risks benefits and alternatives of the procedure were thoroughly explained, informed consent was obtained.  The digital rectal exam revealed several skin tags.   The LB [id number]  endoscope was introduced through the anus and advanced to the cecum, which was identified by both the appendix and ileocecal valve. No adverse events experienced.   The quality of the prep was good, using MoviPrep The instrument was then slowly withdrawn as the colon was fully examined.   COLON FINDINGS: There was mild diverticulosis noted in the sigmoid colon.   The examination was otherwise normal.  No polyps or cancers seen.  Retroflexed views revealed small internal hemorrhoids. The time to cecum=3 minutes 55 seconds.  Withdrawal time=8 minutes 55 seconds.  The scope was withdrawn and the procedure completed.  COMPLICATIONS: There were no immediate complications.  ENDOSCOPIC IMPRESSION: 1.   Mild diverticulosis was  noted in the sigmoid colon 2.   The examination was otherwise normal  RECOMMENDATIONS: You should continue to follow colorectal cancer screening guidelines for "routine risk" patients with a repeat colonoscopy in 10 years. There is no need for FOBT (stool) testing for at least 5 years.  eSigned:  Beverley FiedlerJay M Rodolphe Edmonston, MD 12/12/2013 2:26 PM   cc:  The Patient, PCP

## 2013-12-15 ENCOUNTER — Telehealth: Payer: Self-pay

## 2013-12-15 NOTE — Telephone Encounter (Signed)
  Follow up Call-  Call back number 12/12/2013  Post procedure Call Back phone  # [phone]  Permission to leave phone message Yes     Patient questions:  Do you have a fever, pain , or abdominal swelling? No. Pain Score  0 *  Have you tolerated food without any problems? Yes.    Have you been able to return to your normal activities? Yes.    Do you have any questions about your discharge instructions: Diet   No. Medications  No. Follow up visit  No.  Do you have questions or concerns about your Care? No.  Actions: * If pain score is 4 or above: No action needed, pain <4.

## 2014-01-05 ENCOUNTER — Other Ambulatory Visit: Payer: Commercial Managed Care - HMO

## 2014-01-08 ENCOUNTER — Other Ambulatory Visit (INDEPENDENT_AMBULATORY_CARE_PROVIDER_SITE_OTHER): Payer: Commercial Managed Care - HMO | Admitting: *Deleted

## 2014-01-08 DIAGNOSIS — E782 Mixed hyperlipidemia: Secondary | ICD-10-CM | POA: Diagnosis not present

## 2014-01-08 LAB — LIPID PANEL
CHOL/HDL RATIO: 5
Cholesterol: 192 mg/dL (ref 0–200)
HDL: 38.2 mg/dL — ABNORMAL LOW (ref >39.00)
NonHDL: 153.8
Triglycerides: 464 mg/dL — ABNORMAL HIGH (ref 0.0–149.0)
VLDL: 92.8 mg/dL — ABNORMAL HIGH (ref 0.0–40.0)

## 2014-01-08 LAB — HEPATIC FUNCTION PANEL
ALT: 40 U/L — AB (ref 0–35)
AST: 41 U/L — ABNORMAL HIGH (ref 0–37)
Albumin: 4.4 g/dL (ref 3.5–5.2)
Alkaline Phosphatase: 121 U/L — ABNORMAL HIGH (ref 39–117)
BILIRUBIN DIRECT: 0 mg/dL (ref 0.0–0.3)
TOTAL PROTEIN: 7.3 g/dL (ref 6.0–8.3)
Total Bilirubin: 0.4 mg/dL (ref 0.2–1.2)

## 2014-01-08 LAB — LDL CHOLESTEROL, DIRECT: Direct LDL: 90.8 mg/dL

## 2014-01-09 ENCOUNTER — Other Ambulatory Visit: Payer: Commercial Managed Care - HMO

## 2014-01-14 ENCOUNTER — Telehealth: Payer: Self-pay | Admitting: *Deleted

## 2014-01-14 DIAGNOSIS — E785 Hyperlipidemia, unspecified: Secondary | ICD-10-CM

## 2014-01-14 MED ORDER — FENOFIBRATE 160 MG PO TABS: 160.0000 mg | ORAL_TABLET | Freq: Every day | ORAL | Status: DC

## 2014-01-14 NOTE — Telephone Encounter (Signed)
Spoke with pt and made her aware of lab results.   Informed pt of new order to start taking Fenofibrate 160mg  daily with food. Informed pt that labs needed to be redrawn in 3 months and we scheduled appt for 04/09/14.  Pt verbalized understanding of instructions and was in agreement with this treatment.  Advised pt to call us if there were any problems with new medication.

## 2014-01-14 NOTE — Telephone Encounter (Signed)
-----   Message from Evie LacksSally R Earl, [facility] sent at 01/13/2014 11:30 AM EST ----- RF: CAD, age Goals: LDL<70, non-HDL <100 Current Meds: Lipitor 80mg  and Zetia 10mg  daily  Plan:  TG elevated.  LDL slightly above goal.  On high intensity statin and Zetia.  Reports issues with belching with fish oil in the past.  Would add fenofibrate 160mg  once daily with food to help with TG and give some additional LDL lowering.  Recheck labs in 3 months.

## 2014-03-11 ENCOUNTER — Other Ambulatory Visit: Payer: Self-pay | Admitting: Family Medicine

## 2014-03-11 DIAGNOSIS — I2510 Atherosclerotic heart disease of native coronary artery without angina pectoris: Secondary | ICD-10-CM | POA: Diagnosis not present

## 2014-03-11 DIAGNOSIS — D500 Iron deficiency anemia secondary to blood loss (chronic): Secondary | ICD-10-CM | POA: Diagnosis not present

## 2014-03-11 DIAGNOSIS — I6523 Occlusion and stenosis of bilateral carotid arteries: Secondary | ICD-10-CM | POA: Diagnosis not present

## 2014-03-11 DIAGNOSIS — K219 Gastro-esophageal reflux disease without esophagitis: Secondary | ICD-10-CM | POA: Diagnosis not present

## 2014-03-11 DIAGNOSIS — E782 Mixed hyperlipidemia: Secondary | ICD-10-CM | POA: Diagnosis not present

## 2014-03-11 DIAGNOSIS — E039 Hypothyroidism, unspecified: Secondary | ICD-10-CM | POA: Diagnosis not present

## 2014-03-11 DIAGNOSIS — Z79899 Other long term (current) drug therapy: Secondary | ICD-10-CM | POA: Diagnosis not present

## 2014-03-11 DIAGNOSIS — I10 Essential (primary) hypertension: Secondary | ICD-10-CM | POA: Diagnosis not present

## 2014-03-11 DIAGNOSIS — G4700 Insomnia, unspecified: Secondary | ICD-10-CM | POA: Diagnosis not present

## 2014-04-02 ENCOUNTER — Ambulatory Visit
Admission: RE | Admit: 2014-04-02 | Discharge: 2014-04-02 | Disposition: A | Discharge status: Home / Self Care | Payer: Commercial Managed Care - HMO | Source: Ambulatory Visit | Attending: Family Medicine | Admitting: Family Medicine

## 2014-04-02 DIAGNOSIS — I6523 Occlusion and stenosis of bilateral carotid arteries: Secondary | ICD-10-CM | POA: Diagnosis not present

## 2014-04-08 ENCOUNTER — Other Ambulatory Visit (INDEPENDENT_AMBULATORY_CARE_PROVIDER_SITE_OTHER): Payer: Commercial Managed Care - HMO | Admitting: *Deleted

## 2014-04-08 DIAGNOSIS — E785 Hyperlipidemia, unspecified: Secondary | ICD-10-CM | POA: Diagnosis not present

## 2014-04-08 LAB — LIPID PANEL
CHOL/HDL RATIO: 4
Cholesterol: 190 mg/dL (ref 0–200)
HDL: 52.8 mg/dL (ref >39.00)
LDL CALC: 109 mg/dL — AB (ref 0–99)
NONHDL: 137.2
Triglycerides: 142 mg/dL (ref 0.0–149.0)
VLDL: 28.4 mg/dL (ref 0.0–40.0)

## 2014-04-08 LAB — HEPATIC FUNCTION PANEL
ALT: 15 U/L (ref 0–35)
AST: 25 U/L (ref 0–37)
Albumin: 4.3 g/dL (ref 3.5–5.2)
Alkaline Phosphatase: 64 U/L (ref 39–117)
BILIRUBIN DIRECT: 0.1 mg/dL (ref 0.0–0.3)
Total Bilirubin: 0.5 mg/dL (ref 0.2–1.2)
Total Protein: 7.2 g/dL (ref 6.0–8.3)

## 2014-04-09 ENCOUNTER — Other Ambulatory Visit: Payer: Commercial Managed Care - HMO

## 2014-04-14 ENCOUNTER — Telehealth: Payer: Self-pay | Admitting: *Deleted

## 2014-04-14 DIAGNOSIS — E782 Mixed hyperlipidemia: Secondary | ICD-10-CM

## 2014-04-14 DIAGNOSIS — I10 Essential (primary) hypertension: Secondary | ICD-10-CM

## 2014-04-14 NOTE — Telephone Encounter (Signed)
-----   Message from Corky CraftsJayadeep S Varanasi, MD sent at 04/10/2014  3:52 PM EDT ----- Liver, TG better.  LDL slightly above target.  Repeat labs in 6 months.  If still elevated, will refer to lipid clinic. Healthy diet.

## 2014-04-14 NOTE — Telephone Encounter (Signed)
Informed pt of lab results, healthy diet and to repeat labs in 6 months. Appt scheduled 10/08/14. Pt verbalized understanding and was in agreement with this plan.

## 2014-07-02 ENCOUNTER — Encounter: Payer: Self-pay | Admitting: Gastroenterology

## 2014-09-11 DIAGNOSIS — K219 Gastro-esophageal reflux disease without esophagitis: Secondary | ICD-10-CM | POA: Diagnosis not present

## 2014-09-11 DIAGNOSIS — D500 Iron deficiency anemia secondary to blood loss (chronic): Secondary | ICD-10-CM | POA: Diagnosis not present

## 2014-09-11 DIAGNOSIS — I10 Essential (primary) hypertension: Secondary | ICD-10-CM | POA: Diagnosis not present

## 2014-09-11 DIAGNOSIS — I2510 Atherosclerotic heart disease of native coronary artery without angina pectoris: Secondary | ICD-10-CM | POA: Diagnosis not present

## 2014-09-11 DIAGNOSIS — G4700 Insomnia, unspecified: Secondary | ICD-10-CM | POA: Diagnosis not present

## 2014-09-11 DIAGNOSIS — R1031 Right lower quadrant pain: Secondary | ICD-10-CM | POA: Diagnosis not present

## 2014-09-11 DIAGNOSIS — I6523 Occlusion and stenosis of bilateral carotid arteries: Secondary | ICD-10-CM | POA: Diagnosis not present

## 2014-09-11 DIAGNOSIS — E039 Hypothyroidism, unspecified: Secondary | ICD-10-CM | POA: Diagnosis not present

## 2014-09-11 DIAGNOSIS — E782 Mixed hyperlipidemia: Secondary | ICD-10-CM | POA: Diagnosis not present

## 2014-09-24 DIAGNOSIS — R319 Hematuria, unspecified: Secondary | ICD-10-CM | POA: Diagnosis not present

## 2014-10-02 DIAGNOSIS — R1030 Lower abdominal pain, unspecified: Secondary | ICD-10-CM | POA: Diagnosis not present

## 2014-10-05 ENCOUNTER — Other Ambulatory Visit: Payer: Self-pay | Admitting: Family Medicine

## 2014-10-05 DIAGNOSIS — R1030 Lower abdominal pain, unspecified: Secondary | ICD-10-CM

## 2014-10-06 ENCOUNTER — Encounter: Payer: Self-pay | Admitting: Interventional Cardiology

## 2014-10-08 ENCOUNTER — Other Ambulatory Visit: Payer: Commercial Managed Care - HMO

## 2014-10-12 ENCOUNTER — Ambulatory Visit
Admission: RE | Admit: 2014-10-12 | Discharge: 2014-10-12 | Disposition: A | Discharge status: Home / Self Care | Payer: Commercial Managed Care - HMO | Source: Ambulatory Visit | Attending: Family Medicine | Admitting: Family Medicine

## 2014-10-12 DIAGNOSIS — R1030 Lower abdominal pain, unspecified: Secondary | ICD-10-CM

## 2014-10-13 ENCOUNTER — Ambulatory Visit (INDEPENDENT_AMBULATORY_CARE_PROVIDER_SITE_OTHER): Payer: Commercial Managed Care - HMO | Admitting: Pharmacist

## 2014-10-13 ENCOUNTER — Encounter: Payer: Commercial Managed Care - HMO | Admitting: Pharmacist

## 2014-10-13 DIAGNOSIS — E782 Mixed hyperlipidemia: Secondary | ICD-10-CM | POA: Diagnosis not present

## 2014-10-13 MED ORDER — EZETIMIBE 10 MG PO TABS: 10.0000 mg | ORAL_TABLET | Freq: Every day | ORAL | Status: DC

## 2014-10-13 NOTE — Patient Instructions (Signed)
After the first of the year, restart Zetia  daily.  We have sent a prescription to [facility] but you will just need to call them to fill it after the first of the year.

## 2014-10-13 NOTE — Progress Notes (Signed)
Patient ID: Dawn Boyer, female   DOB: Feb 05, 1945, 68 y.o.   MRN: [id number]    [address] [place], [place]  [address] Phone: [phone] Fax:  [phone]  Date:  10/13/2014   ID:  Dawn Boyer, DOB [birth year], MRN [id number]  PCP:  Cain Saupe, MD      History of Present Illness: Dawn Boyer is a 69 y.o. female of Dr. Eldridge Dace who was referred to the Lipid Clinic for PCSK-9 evalulation.  Pt has a PMH significant for CAD s/p CABG in 1994, HTN, and hyperlipidemia.  Her medication list prior to today's visit including Lipitor  daily, Zetia  daily and fenofibrate  daily.  Since her visit last year, she switched from Lipitor  to Crestor .  She did not have any problems with the Lipitor but asked to change because she thought Crestor would be better.  She had to stop Zetia due to cost and she stopped fenofibrate due to stomach upset.    RF: CAD, HTN LDL goal < 70 mg/dL, non-HDL < 253 mg/dL Current Therapy- Crestor  daily  Intolerances: fenofibrate  (stomach upset)  Labs:  9/30- TC 221, TG 205, HDL 46, LDL 134, AST normal (Crestor )  Past Medical History  Diagnosis Date  . Coronary artery disease   . Hypertension   . Hyperlipidemia   . History of hemolysis, elevated liver enzymes, and low platelet (HELLP) syndrome, currently pregnant     patient denies history  . Hemorrhoid   . Fibrocystic breast   . Insomnia   . Anxiety   . Depression   . Heart disease   . PONV (postoperative nausea and vomiting)     after heart surgery  . Hypothyroidism   . Shortness of breath     exersion  . Stones in the urinary tract   . GERD (gastroesophageal reflux disease)   . Arthritis   . Anemia     hx  . Hemorrhoids     Current Outpatient Prescriptions  Medication Sig Dispense Refill  . rosuvastatin (CRESTOR) 40 MG tablet Take 40 mg by mouth daily.    Marland Kitchen ALPRAZolam (XANAX) 1 MG tablet     . aspirin 325 MG tablet Take 325 mg by mouth daily.      . Calcium Carbonate (CALCIUM 600 PO) Take 1 tablet by mouth daily.    Marland Kitchen ezetimibe (ZETIA) 10 MG tablet Take 10 mg by mouth daily.    Marland Kitchen FeFum-FePo-FA-B Cmp-C-Zn-Mn-Cu (SE-TAN PLUS) 162-115.2-1 MG CAPS Take 1 tablet by mouth daily.     Marland Kitchen levothyroxine (SYNTHROID, LEVOTHROID) 50 MCG tablet Take 50 mcg by mouth daily.    . metoprolol succinate (TOPROL-XL) 50 MG 24 hr tablet Take 25 mg by mouth 2 (two) times daily. Take with or immediately following a meal.    . omeprazole (PRILOSEC) 40 MG capsule daily.    . quinapril (ACCUPRIL) 20 MG tablet Take 40 mg by mouth at bedtime. Take to tabs twice daily    . sertraline (ZOLOFT) 50 MG tablet Take 50 mg by mouth 3 (three) times daily.     . traZODone (DESYREL) 50 MG tablet Take 50 mg by mouth at bedtime.     No current facility-administered medications for this visit.    Allergies:   No Known Allergies  Social History:  The patient  reports that she quit smoking about 31 years ago. Her smoking use included Cigarettes. She has a 12 pack-year smoking history. She  has never used smokeless tobacco. She reports that she does not drink alcohol or use illicit drugs.   Family History:  The patient's family history includes Heart disease in her brother and sister. There is no history of Colon cancer, Esophageal cancer, Rectal cancer, or Stomach cancer.    ASSESSMENT AND PLAN: 1.  Hyperlipidemia- Pt's LDL above goal on Crestor  daily.  She stopped Zetia due to cost.  Zetia is scheduled to go generic by the end of the year.  Pt is agreeable to restart if cost is lower.  Will send a new Rx to pharmacy for Zetia and patient can call to get this filled in January.  She was due for her 1 year follow up with Dr. Eldridge Dace in September.  Will have her make an appt with him before she leaves today.   Signed, Audrie Lia, PharmD, CPP  10/13/2014 12:38 PM

## 2015-01-11 ENCOUNTER — Ambulatory Visit: Payer: Commercial Managed Care - HMO | Admitting: Interventional Cardiology

## 2015-01-22 ENCOUNTER — Other Ambulatory Visit: Payer: Self-pay | Admitting: Internal Medicine

## 2015-01-23 ENCOUNTER — Other Ambulatory Visit: Payer: Self-pay | Admitting: Internal Medicine

## 2015-01-28 ENCOUNTER — Ambulatory Visit (INDEPENDENT_AMBULATORY_CARE_PROVIDER_SITE_OTHER): Payer: Commercial Managed Care - HMO | Admitting: Interventional Cardiology

## 2015-01-28 ENCOUNTER — Encounter: Payer: Self-pay | Admitting: Interventional Cardiology

## 2015-01-28 VITALS — BP 170/98 | HR 72 | Ht 64.5 in | Wt 136.8 lb

## 2015-01-28 DIAGNOSIS — I2510 Atherosclerotic heart disease of native coronary artery without angina pectoris: Secondary | ICD-10-CM

## 2015-01-28 DIAGNOSIS — R109 Unspecified abdominal pain: Secondary | ICD-10-CM | POA: Insufficient documentation

## 2015-01-28 DIAGNOSIS — R1084 Generalized abdominal pain: Secondary | ICD-10-CM | POA: Diagnosis not present

## 2015-01-28 DIAGNOSIS — I10 Essential (primary) hypertension: Secondary | ICD-10-CM

## 2015-01-28 DIAGNOSIS — G479 Sleep disorder, unspecified: Secondary | ICD-10-CM | POA: Insufficient documentation

## 2015-01-28 DIAGNOSIS — E785 Hyperlipidemia, unspecified: Secondary | ICD-10-CM

## 2015-01-28 NOTE — Patient Instructions (Signed)
Medication Instructions:  NO CHANGES   Labwork: NONE  Testing/Procedures: NONE  Follow-Up: Your physician wants you to follow-up in: YEAR WITH DR  VARANASI You will receive a reminder letter in the mail two months in advance. If you don't receive a letter, please call our office to schedule the follow-up appointment.   Any Other Special Instructions Will Be Listed Below (If Applicable).     If you need a refill on your cardiac medications before your next appointment, please call your pharmacy.   

## 2015-01-28 NOTE — Progress Notes (Signed)
Patient ID: Dawn Boyer, female   DOB: Oct 22, 1945, 70 y.o.   MRN: [id number]     Cardiology Office Note   Date:  01/28/2015   ID:  Dawn Boyer, DOB [birth year], MRN [id number]  PCP:  Cain Saupe, MD    No chief complaint on file. f/u CAD   Wt Readings from Last 3 Encounters:  01/28/15 136 lb 12.8 oz (62.052 kg)  12/12/13 139 lb (63.05 kg)  11/13/13 139 lb 3.2 oz (63.141 kg)       History of Present Illness: Dawn Boyer is a 70 y.o. female  who had CABG in 1994. She had throat tightness with walking  At that time. She had great difficulty walking. Currently, she has occasionally chest tightness with anxiety. She walks occasionally  and does not have any anginal sx. CAD/ASCVD:  Denies : Dizziness. Dyspnea on exertion. Nitroglycerin. Orthopnea. Palpitations. Syncope.   She is bothered more by difficulty sleeping and abdominal pain. This is been worked up by her primary care doctor. She doesn't feel trazodone helps her. No obvious signs of sleep apnea when he was seen in her sleep. She does snore apparently.    Past Medical History  Diagnosis Date  . Coronary artery disease   . Hypertension   . Hyperlipidemia   . History of hemolysis, elevated liver enzymes, and low platelet (HELLP) syndrome, currently pregnant     patient denies history  . Hemorrhoid   . Fibrocystic breast   . Insomnia   . Anxiety   . Depression   . Heart disease   . PONV (postoperative nausea and vomiting)     after heart surgery  . Hypothyroidism   . Shortness of breath     exersion  . Stones in the urinary tract   . GERD (gastroesophageal reflux disease)   . Arthritis   . Anemia     hx  . Hemorrhoids     Past Surgical History  Procedure Laterality Date  . Coronary artery bypass graft  1993  . Tubal ligation  1970  . Cholecystectomy  2002  . Cardiac catheterization    . Evaluation under anesthesia with hemorrhoidectomy N/A 03/14/2012    Procedure: EXAM UNDER ANESTHESIA WITH Excisional  HEMORRHOIDECTOMY and Hemorrhoidal banding;  Surgeon: Atilano Ina, MD;  Location: [facility] OR;  Service: General;  Laterality: N/A;  x 2 hemorrhoids  . Hemorrhoid surgery       Current Outpatient Prescriptions  Medication Sig Dispense Refill  . ALPRAZolam (XANAX) 1 MG tablet Take 1 mg by mouth daily.     Marland Kitchen aspirin 325 MG tablet Take 325 mg by mouth daily.    . Calcium Carbonate (CALCIUM 600 PO) Take 1 tablet by mouth daily.    Marland Kitchen FeFum-FePo-FA-B Cmp-C-Zn-Mn-Cu (SE-TAN PLUS) 162-115.2-1 MG CAPS Take 1 tablet by mouth daily.     Marland Kitchen levothyroxine (SYNTHROID, LEVOTHROID) 50 MCG tablet Take 50 mcg by mouth daily.    . metoprolol succinate (TOPROL-XL) 50 MG 24 hr tablet Take 25 mg by mouth 2 (two) times daily. Take with or immediately following a meal.    . omeprazole (PRILOSEC) 40 MG capsule Take 40 mg by mouth daily.     . quinapril (ACCUPRIL) 20 MG tablet Take 40 mg by mouth at bedtime. Take to tabs twice daily    . rosuvastatin (CRESTOR) 40 MG tablet Take 40 mg by mouth daily.    . sertraline (ZOLOFT) 50 MG tablet Take 50 mg by mouth 3 (three) times  daily.     . traZODone (DESYREL) 50 MG tablet Take 50 mg by mouth daily as needed for sleep.      No current facility-administered medications for this visit.    Allergies:   Review of patient's allergies indicates not on file.    Social History:  The patient  reports that she quit smoking about 32 years ago. Her smoking use included Cigarettes. She has a 12 pack-year smoking history. She has never used smokeless tobacco. She reports that she does not drink alcohol or use illicit drugs.   Family History:  The patient's family history includes Heart disease in her brother and sister. There is no history of Colon cancer, Esophageal cancer, Rectal cancer, or Stomach cancer.    ROS:  Please see the history of present illness.   Otherwise, review of systems are positive for abdominal pain.   All other systems are reviewed and negative.    PHYSICAL  EXAM: VS:  BP 170/98 mmHg  Pulse 72  Ht 5' 4.5" (1.638 m)  Wt 136 lb 12.8 oz (62.052 kg)  BMI 23.13 kg/m2 , BMI Body mass index is 23.13 kg/(m^2). GEN: Well nourished, well developed, in no acute distress HEENT: normal Neck: no JVD, carotid bruits, or masses Cardiac: RRR; no murmurs, rubs, or gallops,no edema  Respiratory:  clear to auscultation bilaterally, normal work of breathing GI: soft, nontender, nondistended, + BS MS: no deformity or atrophy Skin: warm and dry, no rash Neuro:  Strength and sensation are intact Psych: euthymic mood, full affect     Recent Labs: 04/08/2014: ALT 15   Lipid Panel    Component Value Date/Time   CHOL 190 04/08/2014 1055   TRIG 142.0 04/08/2014 1055   HDL 52.80 04/08/2014 1055   CHOLHDL 4 04/08/2014 1055   VLDL 28.4 04/08/2014 1055   LDLCALC 109* 04/08/2014 1055   LDLDIRECT 90.8 01/08/2014 0828     Other studies Reviewed: Additional studies/ records that were reviewed today with results demonstrating: .   ASSESSMENT AND PLAN:  1. CAD: Continue aspirin. No angina. No change on ECG. Negative stress test in 2014 pre surgery. 2. HTN: Controlled.   Continue current medications. 3. Hyperlipidemia:  Continue Crestor. Zetia was too expensive. She did not qualify for PCS canine inhibitor. Lipids are near target at this point. Continue to eat a healthy diet. Try to increase exercise as well. 4.  fatigue: I think some of this is deconditioning. She has not been walking regularly. 5.  Abdominal pain: She had a CT scan which was negative per her report. She needs to follow-up with her primary care doctor. 6.  Sleep disturbance: She does not sleep well. She doesn't feel trazodone works. I asked her to follow-up with her primary care doctor. We talked about symptoms of sleep apnea but there is been no observed apnea. It sounds more like a problem falling asleep and staying asleep.   Current medicines are reviewed at length with the patient today.   The patient concerns regarding her medicines were addressed.  The following changes have been made:  No change  Labs/ tests ordered today include:  No orders of the defined types were placed in this encounter.    Recommend 150 minutes/week of aerobic exercise Low fat, low carb, high fiber diet recommended  Disposition:   FU in 1 year   Delorise Jackson., MD  01/28/2015 9:37 AM    [facility] [address] [place], [place], [place]  [address]  Phone: [phone]; Fax: [phone]

## 2015-04-13 DIAGNOSIS — I10 Essential (primary) hypertension: Secondary | ICD-10-CM | POA: Diagnosis not present

## 2015-04-13 DIAGNOSIS — R8290 Unspecified abnormal findings in urine: Secondary | ICD-10-CM | POA: Diagnosis not present

## 2015-04-13 DIAGNOSIS — E039 Hypothyroidism, unspecified: Secondary | ICD-10-CM | POA: Diagnosis not present

## 2015-04-13 DIAGNOSIS — D500 Iron deficiency anemia secondary to blood loss (chronic): Secondary | ICD-10-CM | POA: Diagnosis not present

## 2015-04-13 DIAGNOSIS — Z7901 Long term (current) use of anticoagulants: Secondary | ICD-10-CM | POA: Diagnosis not present

## 2015-04-13 DIAGNOSIS — E782 Mixed hyperlipidemia: Secondary | ICD-10-CM | POA: Diagnosis not present

## 2015-04-13 DIAGNOSIS — I6523 Occlusion and stenosis of bilateral carotid arteries: Secondary | ICD-10-CM | POA: Diagnosis not present

## 2015-04-13 DIAGNOSIS — G4700 Insomnia, unspecified: Secondary | ICD-10-CM | POA: Diagnosis not present

## 2015-04-13 DIAGNOSIS — I2510 Atherosclerotic heart disease of native coronary artery without angina pectoris: Secondary | ICD-10-CM | POA: Diagnosis not present

## 2015-04-13 DIAGNOSIS — Z79899 Other long term (current) drug therapy: Secondary | ICD-10-CM | POA: Diagnosis not present

## 2015-04-14 ENCOUNTER — Other Ambulatory Visit: Payer: Self-pay | Admitting: Family Medicine

## 2015-04-14 DIAGNOSIS — R0989 Other specified symptoms and signs involving the circulatory and respiratory systems: Secondary | ICD-10-CM

## 2015-04-26 ENCOUNTER — Ambulatory Visit (HOSPITAL_COMMUNITY)
Admission: RE | Admit: 2015-04-26 | Discharge: 2015-04-26 | Disposition: A | Discharge status: Home / Self Care | Payer: Commercial Managed Care - HMO | Source: Ambulatory Visit | Attending: Family Medicine | Admitting: Family Medicine

## 2015-04-26 DIAGNOSIS — R0989 Other specified symptoms and signs involving the circulatory and respiratory systems: Secondary | ICD-10-CM | POA: Insufficient documentation

## 2015-04-26 DIAGNOSIS — G4700 Insomnia, unspecified: Secondary | ICD-10-CM | POA: Diagnosis not present

## 2015-04-26 DIAGNOSIS — E785 Hyperlipidemia, unspecified: Secondary | ICD-10-CM | POA: Diagnosis not present

## 2015-04-26 DIAGNOSIS — F419 Anxiety disorder, unspecified: Secondary | ICD-10-CM | POA: Insufficient documentation

## 2015-04-26 DIAGNOSIS — K219 Gastro-esophageal reflux disease without esophagitis: Secondary | ICD-10-CM | POA: Insufficient documentation

## 2015-04-26 DIAGNOSIS — F329 Major depressive disorder, single episode, unspecified: Secondary | ICD-10-CM | POA: Diagnosis not present

## 2015-04-26 DIAGNOSIS — I2510 Atherosclerotic heart disease of native coronary artery without angina pectoris: Secondary | ICD-10-CM | POA: Diagnosis not present

## 2015-04-26 DIAGNOSIS — E039 Hypothyroidism, unspecified: Secondary | ICD-10-CM | POA: Insufficient documentation

## 2015-04-26 DIAGNOSIS — I10 Essential (primary) hypertension: Secondary | ICD-10-CM | POA: Insufficient documentation

## 2015-04-26 NOTE — Progress Notes (Signed)
Preliminary results by tech - Carotid Duplex Completed.  Marilynne Halstedita Theone Bowell, BS, RDMS, RVT

## 2015-04-30 ENCOUNTER — Encounter: Payer: Self-pay | Admitting: Vascular Surgery

## 2015-05-07 ENCOUNTER — Telehealth: Payer: Self-pay | Admitting: Interventional Cardiology

## 2015-05-07 ENCOUNTER — Ambulatory Visit (INDEPENDENT_AMBULATORY_CARE_PROVIDER_SITE_OTHER): Payer: Commercial Managed Care - HMO | Admitting: Vascular Surgery

## 2015-05-07 ENCOUNTER — Encounter: Payer: Self-pay | Admitting: Vascular Surgery

## 2015-05-07 VITALS — BP 191/97 | HR 72 | Temp 97.0°F | Resp 16 | Ht 64.0 in | Wt 136.0 lb

## 2015-05-07 DIAGNOSIS — I779 Disorder of arteries and arterioles, unspecified: Secondary | ICD-10-CM | POA: Diagnosis not present

## 2015-05-07 DIAGNOSIS — I739 Peripheral vascular disease, unspecified: Principal | ICD-10-CM

## 2015-05-07 MED ORDER — METOPROLOL SUCCINATE ER 50 MG PO TB24: ORAL_TABLET | ORAL | Status: DC

## 2015-05-07 NOTE — Telephone Encounter (Signed)
Tabitia is calling because her mother blood pressure is extremely high and is wanting to speak to someone or come in to see someone today . Please call  Thanks

## 2015-05-07 NOTE — Telephone Encounter (Signed)
**Note De-identified Dawn Boyer Obfuscation** LMTCB

## 2015-05-07 NOTE — Telephone Encounter (Addendum)
**Note De-Identified Dawn Boyer Obfuscation** The pt had OV with Dr Imogene Burnhen this am and her BP was 191/97. Per the pts daughter, Dr Imogene Burnhen advised her to call us concerning the pts elevated BP.   Per Dawn Demarkhonda Barrett, PA-c the pts daughter, Dawn Boyer, is advised to increase the pts morning Metoprolol dose to 50 mg and to continue Metoprolol 25 mg in the evenings. She verbalized understanding and states that they will begin to check the pts BP every morning about 1 and 1/2 to 2 hours after she has taken her morning medications, will write readings down and will call me back in 1 week to give me the pts BP readings. Also, the pts daughter is requesting an apt for the pt because of her elevated BP. She is advised that we will schedule the pt an apt when we speak next week if med adjustment does not bring the pts BP down. She verbalized understanding.  Will forward message to Dr Eldridge DaceVaranasi as Lorain ChildesFYI.

## 2015-05-07 NOTE — Progress Notes (Signed)
New Carotid Patient  Referred by:  Cain Saupe, MD [address] [place], [place] [address]  Reason for referral: L carotid stenosis  History of Present Illness  Dawn Boyer is a 70 y.o. (12/26/1945) female who presents with chief complaint: abnormal "neck studies".  This patient previous was diagnosed with carotid disease prior to CABG previous.  Her recent studies demonstrated: RICA <50% stenosis, LICA 50-69% stenosis.  Patient has no history of TIA or stroke symptom.  The patient has never had amaurosis fugax or monocular blindness.  The patient has never had facial drooping or hemiplegia.  The patient has never had receptive or expressive aphasia.   The patient's risks factors for carotid disease include: HTN, HLD and prior smoking.  Past Medical History  Diagnosis Date  . Coronary artery disease   . Hypertension   . Hyperlipidemia   . History of hemolysis, elevated liver enzymes, and low platelet (HELLP) syndrome, currently pregnant     patient denies history  . Hemorrhoid   . Fibrocystic breast   . Insomnia   . Anxiety   . Depression   . Heart disease   . PONV (postoperative nausea and vomiting)     after heart surgery  . Hypothyroidism   . Shortness of breath     exersion  . Stones in the urinary tract   . GERD (gastroesophageal reflux disease)   . Arthritis   . Anemia     hx  . Hemorrhoids     Past Surgical History  Procedure Laterality Date  . Coronary artery bypass graft  1993  . Tubal ligation  1970  . Cholecystectomy  2002  . Cardiac catheterization    . Evaluation under anesthesia with hemorrhoidectomy N/A 03/14/2012    Procedure: EXAM UNDER ANESTHESIA WITH Excisional HEMORRHOIDECTOMY and Hemorrhoidal banding;  Surgeon: Atilano Ina, MD;  Location: [facility] OR;  Service: General;  Laterality: N/A;  x 2 hemorrhoids  . Hemorrhoid surgery      Social History   Social History  . Marital Status: Married    Spouse Name: N/A  . Number of Children: N/A  .  Years of Education: N/A   Occupational History  . Not on file.   Social History Main Topics  . Smoking status: Former Smoker -- 1.00 packs/day for 12 years    Types: Cigarettes    Quit date: 11/21/1982  . Smokeless tobacco: Never Used  . Alcohol Use: No  . Drug Use: No  . Sexual Activity: Not on file   Other Topics Concern  . Not on file   Social History Narrative    Family History  Problem Relation Age of Onset  . Heart disease Sister   . Heart disease Brother   . Colon cancer Neg Hx   . Esophageal cancer Neg Hx   . Rectal cancer Neg Hx   . Stomach cancer Neg Hx     Current Outpatient Prescriptions  Medication Sig Dispense Refill  . ALPRAZolam (XANAX) 1 MG tablet Take 1 mg by mouth daily.     Marland Kitchen aspirin 325 MG tablet Take 325 mg by mouth daily.    . Calcium Carbonate (CALCIUM 600 PO) Take 1 tablet by mouth daily.    Marland Kitchen FeFum-FePo-FA-B Cmp-C-Zn-Mn-Cu (SE-TAN PLUS) 162-115.2-1 MG CAPS Take 1 tablet by mouth daily.     Marland Kitchen levothyroxine (SYNTHROID, LEVOTHROID) 50 MCG tablet Take 50 mcg by mouth daily.    . metoprolol succinate (TOPROL-XL) 50 MG 24 hr tablet  Take 25 mg by mouth 2 (two) times daily. Take with or immediately following a meal.    . omeprazole (PRILOSEC) 40 MG capsule Take 40 mg by mouth daily.     . quinapril (ACCUPRIL) 20 MG tablet Take 40 mg by mouth at bedtime. Take to tabs twice daily    . rosuvastatin (CRESTOR) 40 MG tablet Take 40 mg by mouth daily.    . sertraline (ZOLOFT) 50 MG tablet Take 50 mg by mouth 3 (three) times daily.     . traZODone (DESYREL) 50 MG tablet Take 50 mg by mouth daily as needed for sleep.      No current facility-administered medications for this visit.    Not on File   REVIEW OF SYSTEMS:  (Positives checked otherwise negative)  CARDIOVASCULAR:   [x]  chest pain,  [x]  chest pressure,  [ ]  palpitations,  [ ]  shortness of breath when laying flat,  [x]  shortness of breath with exertion,   [ ]  pain in feet when walking,  [ ]   pain in feet when laying flat, [ ]  history of blood clot in veins (DVT),  [ ]  history of phlebitis,  [ ]  swelling in legs,  [ ]  varicose veins  PULMONARY:   [ ]  productive cough,  [ ]  asthma,  [ ]  wheezing  NEUROLOGIC:   [ ]  weakness in arms or legs,  [ ]  numbness in arms or legs,  [ ]  difficulty speaking or slurred speech,  [ ]  temporary loss of vision in one eye,  [ ]  dizziness  HEMATOLOGIC:   [ ]  bleeding problems,  [ ]  problems with blood clotting too easily  MUSCULOSKEL:   [ ]  joint pain, [ ]  joint swelling  GASTROINTEST:   [ ]  vomiting blood,  [x]  blood in stool    [x]  chronic constipation requiring enema use [x]  prior hemorrhoidectomy  GENITOURINARY:   [ ]  burning with urination,  [ ]  blood in urine  PSYCHIATRIC:   [ ]  history of major depression  INTEGUMENTARY:   [ ]  rashes,  [ ]  ulcers  CONSTITUTIONAL:   [ ]  fever,  [ ]  chills   For VQI Use Only  PRE-ADM LIVING: Home  AMB STATUS: Ambulatory  CAD Sx: None  PRIOR CHF: None  STRESS TEST: [x]  No, [ ]  Normal, [ ]  + ischemia, [ ]  + MI, [ ]  Both   Physical Examination  Filed Vitals:   05/07/15 0950 05/07/15 0954 05/07/15 0955  BP: 180/90 191/92 191/97  Pulse: 72 72 72  Temp: 97 F (36.1 C)    Resp: 16    Height: 5\' 4"  (1.626 m)    Weight: 136 lb (61.689 kg)    SpO2: 97%     Body mass index is 23.33 kg/(m^2).  General: A&O x 3, WDWN  Head: [place]/AT  Ear/Nose/Throat: Hearing grossly intact, nares w/o erythema or drainage, oropharynx w/o Erythema/Exudate, Mallampati score: 3  Eyes: PERRLA, EOMI  Neck: Supple, no nuchal rigidity, no palpable LAD  Pulmonary: Sym exp, good air movt, CTAB, no rales, rhonchi, & wheezing  Cardiac: RRR, Nl S1, S2, no Murmurs, rubs or gallops  Vascular: Vessel Right Left  Radial Palpable Palpable  Brachial Palpable Palpable  Carotid Palpable, without bruit Palpable, without bruit  Aorta  Not palpable N/A  Femoral Palpable Palpable  Popliteal Not  palpable Not palpable  PT Palpable Palpable  DP Palpable Palpable   Gastrointestinal: soft, NTND, -G/R, - HSM, - masses, - CVAT B  Musculoskeletal: M/S  5/5 throughout , Extremities without ischemic changes   Neurologic: CN 2-12 intact , Pain and light touch intact in extremities , Motor exam as listed above  Psychiatric: Judgment intact, Mood & affect appropriate for pt's clinical situation  Dermatologic: See M/S exam for extremity exam, no rashes otherwise noted  Lymph : No Cervical, Axillary, or Inguinal lymphadenopathy    Non-Invasive Vascular Imaging  OUTSIDE CAROTID DUPLEX (Date: 04/02/14):   R ICA stenosis: <50% (144/41)  R VA: patent and antegrade  L ICA stenosis: 50-69% (164/47)  L VA:  patent and antegrade  OUTSIDE CAROTID DUPLEX (Date: 04/30/15):   R ICA stenosis: 1-39% (136/38)  R VA: patent and antegrade  L ICA stenosis: 40-59% (171/54)  L VA: patent and antegrade   Outside Studies/Documentation 10 pages of outside documents were reviewed including: outpatient PCP chart, outside carotid duplex report.   Medical Decision Making  Dawn Boyer is a 70 y.o. female who presents with: R asx ICA stenosis <40%, L asx ICA stenosis <60%   Based on the patient's vascular studies and examination, I have offered the patient: B carotid duplex in 6 months.  I discussed in depth with the patient the nature of atherosclerosis, and emphasized the importance of maximal medical management including strict control of blood pressure, blood glucose, and lipid levels, obtaining regular exercise, antiplatelet agents, and cessation of smoking.   The patient is currently on a statin: Crestor.  The patient is currently on an anti-platelet: ASA.  The patient is aware that without maximal medical management the underlying atherosclerotic disease process will progress, limiting the benefit of any interventions.  Thank you for allowing us to participate in this patient's  care.   Leonides SakeBrian Karie Skowron, MD [facility] Office: [phone] Pager: [phone]  05/07/2015, 12:55 PM

## 2015-06-02 ENCOUNTER — Telehealth: Payer: Self-pay | Admitting: Interventional Cardiology

## 2015-06-02 NOTE — Telephone Encounter (Addendum)
**Note De-Identified Jaimes Eckert Obfuscation** The pts daughter, Dawn Boyer, states that the pt is having headaches, fatigue and weakness and feels that it is due to her elevated BP. I talked with the pts daughter on 5/5 (see phone note) but Dawn Boyer states that the pt continues to have elevated BP and is requesting an appt.  I have scheduled the pt to see Ward Givenshris Berge, NP tomorrow am at 9:00. Dawn Boyer is aware and is in agreement with plan and appt date and time. She states that she will notify the pt that she has an appt with Ward Givenshris Berge, NP at 9 am tomorrow.  Dawn Boyer thanked me for my assistance.

## 2015-06-02 NOTE — Telephone Encounter (Signed)
LMTCB

## 2015-06-02 NOTE — Telephone Encounter (Signed)
Pt c/o BP issue: STAT if pt c/o blurred vision, one-sided weakness or slurred speech  1. What are your last 5 BP readings? Not Sure of reading   2. Are you having any other symptoms (ex. Dizziness, headache, blurred vision, passed out)? Headaches and a lot of Fatique   3. What is your BP issue? Keeps running high and does not want to change medication , because she does not likes the way it makes her feel

## 2015-06-03 ENCOUNTER — Encounter: Payer: Self-pay | Admitting: Physician Assistant

## 2015-06-03 ENCOUNTER — Ambulatory Visit (INDEPENDENT_AMBULATORY_CARE_PROVIDER_SITE_OTHER): Payer: Commercial Managed Care - HMO | Admitting: Nurse Practitioner

## 2015-06-03 VITALS — BP 172/90 | HR 66 | Ht 64.0 in | Wt 131.6 lb

## 2015-06-03 DIAGNOSIS — I119 Hypertensive heart disease without heart failure: Secondary | ICD-10-CM | POA: Insufficient documentation

## 2015-06-03 DIAGNOSIS — I779 Disorder of arteries and arterioles, unspecified: Secondary | ICD-10-CM | POA: Insufficient documentation

## 2015-06-03 DIAGNOSIS — E039 Hypothyroidism, unspecified: Secondary | ICD-10-CM | POA: Insufficient documentation

## 2015-06-03 DIAGNOSIS — I2510 Atherosclerotic heart disease of native coronary artery without angina pectoris: Secondary | ICD-10-CM | POA: Insufficient documentation

## 2015-06-03 DIAGNOSIS — E785 Hyperlipidemia, unspecified: Secondary | ICD-10-CM | POA: Diagnosis not present

## 2015-06-03 DIAGNOSIS — I739 Peripheral vascular disease, unspecified: Secondary | ICD-10-CM

## 2015-06-03 MED ORDER — AMLODIPINE BESYLATE 5 MG PO TABS: 5.0000 mg | ORAL_TABLET | Freq: Every day | ORAL | Status: DC

## 2015-06-03 MED ORDER — AMLODIPINE BESYLATE 5 MG PO TABS
5.0000 mg | ORAL_TABLET | Freq: Every day | ORAL | Status: DC
Start: 2015-06-03 — End: 2015-06-03

## 2015-06-03 NOTE — Patient Instructions (Signed)
Medication Instructions:  Your physician has recommended you make the following change in your medication:  1. Start Amlodipine ( 5 mg ) daily, sent in today to [facility]   Labwork: -None  Testing/Procedures: -None  Follow-Up: Your physician wants you to follow-up in: 6 months with Dr. Eldridge DaceVaranasi.  You will receive a reminder letter in the mail two months in advance. If you don't receive a letter, please call our office to schedule the follow-up appointment.   Any Other Special Instructions Will Be Listed Below (If Applicable). Monitor bp a couple times a week and record.  In a couple of weeks call our office at [phone] and leave Larita FifeLynn Via, LPN results of readings.     If you need a refill on your cardiac medications before your next appointment, please call your pharmacy.

## 2015-06-03 NOTE — Progress Notes (Signed)
Office Visit    Patient Name: Dawn Boyer Date of Encounter: 06/03/2015  Primary Care Provider:  Cain SaupeFULP, CAMMIE, MD Primary Cardiologist:  Catalina GravelJ. Varanasi, MD   Chief Complaint    70 year old female with prior history of coronary artery disease and hypertension who presents for follow-up related to ongoing elevated blood pressures at home.   Past Medical History  Diagnosis Date  . Coronary artery disease     a. 1994 s/p CABG;  b. 03/2012 MV: No ischemia/infarct.  . Hypertensive heart disease   . Hyperlipidemia   . History of hemolysis, elevated liver enzymes, and low platelet (HELLP) syndrome, currently pregnant     patient denies history  . Hemorrhoid   . Fibrocystic breast   . Insomnia   . Anxiety   . Depression   . PONV (postoperative nausea and vomiting)     after heart surgery  . Hypothyroidism   . Nephrolithiasis   . GERD (gastroesophageal reflux disease)   . Arthritis   . Anemia     hx  . Hemorrhoids   . Carotid arterial disease (HCC)     a. 04/2015 Carotid U/S: RICA 1-39%, LICA 40-59%, no change.   Past Surgical History  Procedure Laterality Date  . Coronary artery bypass graft  1993  . Tubal ligation  1970  . Cholecystectomy  2002  . Cardiac catheterization    . Evaluation under anesthesia with hemorrhoidectomy N/A 03/14/2012    Procedure: EXAM UNDER ANESTHESIA WITH Excisional HEMORRHOIDECTOMY and Hemorrhoidal banding;  Surgeon: Atilano InaEric M Wilson, MD;  [facility];  Service: General;  Laterality: N/A;  x 2 hemorrhoids  . Hemorrhoid surgery      Allergies  No Known Allergies  History of Present Illness   70 year old female with a prior history of coronary artery disease status post coronary artery bypass grafting in 1994. She had a low risk stress test in 2014. She also has a history of hypertension, hyperlipidemia, hypothyroidism, carotid arterial disease, and remote tobacco abuse. She was last seen in clinic by Dr.  Eldridge DaceVaranasi in January, at which time she  was doing relatively well. Blood pressure was elevated at that time. She has been maintained on quinapril 40 mg daily and also Toprol-XL 59 g in the morning and 25 mg in the evening. She checks her blood pressure several times per week and has noted that it is typically in the 170s. Sometimes, this is associated with headaches. She does not have significant chest pain, dyspnea, palpitations, PND, orthopnea, dizziness, syncope, or edema.   Home Medications    Prior to Admission medications   Medication Sig Start Date End Date Taking? Authorizing Provider  ALPRAZolam Prudy Feeler(XANAX) 1 MG tablet Take 1 mg by mouth daily.  12/09/13  Yes Historical Provider, MD  aspirin 325 MG tablet Take 325 mg by mouth daily.   Yes Historical Provider, MD  Calcium Carbonate (CALCIUM 600 PO) Take 1 tablet by mouth daily.   Yes Historical Provider, MD  FeFum-FePo-FA-B Cmp-C-Zn-Mn-Cu (SE-TAN PLUS) 162-115.2-1 MG CAPS Take 1 tablet by mouth daily.    Yes Historical Provider, MD  levothyroxine (SYNTHROID, LEVOTHROID) 50 MCG tablet Take 50 mcg by mouth daily.   Yes Historical Provider, MD  metoprolol succinate (TOPROL-XL) 50 MG 24 hr tablet Take 50 mg by mouth in the am and 25 mg in the pm. Take with or immediately following a meal.   Yes Historical Provider, MD  omeprazole (PRILOSEC) 40 MG capsule Take 40 mg by mouth daily.  05/27/12  Yes Historical Provider, MD  quinapril (ACCUPRIL) 20 MG tablet Take 40 mg by mouth at bedtime.    Yes Historical Provider, MD  rosuvastatin (CRESTOR) 40 MG tablet Take 40 mg by mouth daily.   Yes Historical Provider, MD  sertraline (ZOLOFT) 50 MG tablet Take 50 mg by mouth 3 (three) times daily.    Yes Historical Provider, MD  traZODone (DESYREL) 50 MG tablet Take 50 mg by mouth at bedtime as needed for sleep.  09/10/13  Yes Historical Provider, MD  amLODipine (NORVASC) 5 MG tablet Take 1 tablet (5 mg total) by mouth daily. 06/03/15   Ok Anis, NP    Review of Systems    Blood pressures  been running high at home and this concerns the patient.   She denies chest pain, palpitations, dyspnea, pnd, orthopnea, n, v, dizziness, syncope, edema, weight gain, or early satiety.  All other systems reviewed and are otherwise negative except as noted above.  Physical Exam    VS:  BP 172/90 mmHg  Pulse 66  Ht  (1.626 m)  Wt 131 lb 9.6 oz (59.693 kg)  BMI 22.58 kg/m2  SpO2 96% , BMI Body mass index is 22.58 kg/(m^2). GEN: Well nourished, well developed, in no acute distress. HEENT: normal. Neck: Supple, no JVD or masses.  Bilateral bruits versus radiated aortic murmur.  Cardiac: RRR, 3/6 systolic ejection murmur loudest at the right upper sternal border but heard throughout. This also radiates to bilateral neck. No rubs or gallops. No clubbing, cyanosis, edema.  Radials/DP/PT 2+ and equal bilaterally.  Respiratory:  Respirations regular and unlabored, clear to auscultation bilaterally. GI: Soft, nontender, nondistended, BS + x 4. MS: no deformity or atrophy. Skin: warm and dry, no rash. Neuro:  Strength and sensation are intact. Psych: Normal affect.  Accessory Clinical Findings    ECG - radial sinus rhythm, 66, rightward axis, inferior and anterolateral T-wave inversion-no acute changes.   Assessment & Plan    1.  Coronary artery disease: Status post coronary artery bypass grafting in 1994. She has been doing well without chest pain or dyspnea. She remains on aspirin, beta blocker, ACE inhibitor, and statin therapy.  2. Hypertensive heart disease: Blood pressures been trending in the 170s at home and is 172/90 today. She is on maximum dose quinapril and also on a total of 75 mg daily of Toprol-XL (taken as 50 mg in the a.m. and 25 g in the p.m.). Heart rate is in the mid 60s. I don't think further titration of her Toprol will provide significant blood pressure lowering. As a result, I am adding amlodipine 5 mg daily. I've asked her to check her blood pressure several times a  week over the next 2 weeks and record these values and call us with this information, at which point we can consider titrating amlodipine further.    3. Hyperlipidemia: Continue statin therapy. LDL was 109 in April 2016.  4. Bilateral carotid arterial disease: Stable plaque per recent ultrasound. Follow-up next year. Continue aspirin and statin therapy.  5. Disposition: Adjust blood pressure medication today and patient will follow at home and call with results.   Nicolasa Ducking, NP 06/03/2015, 9:31 AM

## 2015-06-21 NOTE — Addendum Note (Signed)
Addended by: Sharee PimpleMCCHESNEY, MARILYN K on: 06/21/2015 05:12 PM   Modules accepted: Orders

## 2015-10-18 DIAGNOSIS — F320 Major depressive disorder, single episode, mild: Secondary | ICD-10-CM | POA: Diagnosis not present

## 2015-10-18 DIAGNOSIS — I10 Essential (primary) hypertension: Secondary | ICD-10-CM | POA: Diagnosis not present

## 2015-10-18 DIAGNOSIS — D500 Iron deficiency anemia secondary to blood loss (chronic): Secondary | ICD-10-CM | POA: Diagnosis not present

## 2015-10-18 DIAGNOSIS — E782 Mixed hyperlipidemia: Secondary | ICD-10-CM | POA: Diagnosis not present

## 2015-10-18 DIAGNOSIS — E039 Hypothyroidism, unspecified: Secondary | ICD-10-CM | POA: Diagnosis not present

## 2015-10-18 DIAGNOSIS — Z7901 Long term (current) use of anticoagulants: Secondary | ICD-10-CM | POA: Diagnosis not present

## 2015-10-18 DIAGNOSIS — Z79899 Other long term (current) drug therapy: Secondary | ICD-10-CM | POA: Diagnosis not present

## 2015-10-18 DIAGNOSIS — D696 Thrombocytopenia, unspecified: Secondary | ICD-10-CM | POA: Diagnosis not present

## 2015-10-18 DIAGNOSIS — I2510 Atherosclerotic heart disease of native coronary artery without angina pectoris: Secondary | ICD-10-CM | POA: Diagnosis not present

## 2015-11-08 ENCOUNTER — Other Ambulatory Visit: Payer: Self-pay | Admitting: *Deleted

## 2015-11-08 ENCOUNTER — Telehealth: Payer: Self-pay | Admitting: Interventional Cardiology

## 2015-11-08 MED ORDER — METOPROLOL SUCCINATE ER 50 MG PO TB24: ORAL_TABLET | ORAL | 1 refills | Status: DC

## 2015-11-08 NOTE — Telephone Encounter (Signed)
°*  STAT* If patient is at the pharmacy, call can be transferred to refill team.   1. Which medications need to be refilled? (please list name of each medication and dose if known) Metoprolol   2. Which pharmacy/location (including street and city if local pharmacy) is medication to be sent to?[facility]   3. Do they need a 30 day or 90 day supply?90

## 2016-01-31 NOTE — Progress Notes (Signed)
Patient ID: Dawn Boyer, female   DOB: 11/15/1945, 71 y.o.   MRN: [id number]     Cardiology Office Note   Date:  02/01/2016   ID:  Dawn Boyer, DOB 06/30/1945, MRN [id number]  PCP:  Cain SaupeFULP, CAMMIE, MD    No chief complaint on file. f/u CAD   Wt Readings from Last 3 Encounters:  02/01/16 131 lb (59.4 kg)  06/03/15 131 lb 9.6 oz (59.7 kg)  05/07/15 136 lb (61.7 kg)       History of Present Illness: Dawn Boyer is a 71 y.o. female  who had CABG in 1994. She had throat tightness with walking  At that time. She had great difficulty walking. Currently, she has occasionally chest tightness with anxiety. She walks occasionally and does not have any anginal sx.  She has not been exercising.    Her son passed away at age 69 from fentanyl + alcohol.  CAD/ASCVD:  Denies : Dizziness. Dyspnea on exertion. Nitroglycerin. Orthopnea. Palpitations. Syncope.   She is bothered more by difficulty sleeping. This Is followed by her primary care doctor. She doesn't feel trazodone helps her. No obvious signs of sleep apnea when he was seen in her sleep. She does snore apparently.  Going to the grocery store is her most strenuous activity.    She checks BP at home.  Readings are typically in the 120-160 range systolic.     Past Medical History:  Diagnosis Date  . Anemia    hx  . Anxiety   . Arthritis   . Carotid arterial disease (HCC)    a. 04/2015 Carotid U/S: RICA 1-39%, LICA 40-59%, no change.  . Coronary artery disease    a. 1994 s/p CABG;  b. 03/2012 MV: No ischemia/infarct.  . Depression   . Fibrocystic breast   . GERD (gastroesophageal reflux disease)   . Hemorrhoid   . Hemorrhoids   . History of hemolysis, elevated liver enzymes, and low platelet (HELLP) syndrome, currently pregnant    patient denies history  . Hyperlipidemia   . Hypertensive heart disease   . Hypothyroidism   . Insomnia   . Nephrolithiasis   . PONV (postoperative nausea and vomiting)    after heart surgery     Past Surgical History:  Procedure Laterality Date  . CARDIAC CATHETERIZATION    . CHOLECYSTECTOMY  2002  . CORONARY ARTERY BYPASS GRAFT  1993  . EVALUATION UNDER ANESTHESIA WITH HEMORRHOIDECTOMY N/A 03/14/2012   Procedure: EXAM UNDER ANESTHESIA WITH Excisional HEMORRHOIDECTOMY and Hemorrhoidal banding;  Surgeon: Atilano InaEric M Wilson, MD;  [facility];  Service: General;  Laterality: N/A;  x 2 hemorrhoids  . HEMORRHOID SURGERY    . TUBAL LIGATION  1970     Current Outpatient Prescriptions  Medication Sig Dispense Refill  . ALPRAZolam (XANAX) 1 MG tablet Take 1 mg by mouth daily.     Marland Kitchen. aspirin 325 MG tablet Take 325 mg by mouth daily.    Marland Kitchen. FeFum-FePo-FA-B Cmp-C-Zn-Mn-Cu (SE-TAN PLUS) 162-115.2-1 MG CAPS Take 1 tablet by mouth daily.     Marland Kitchen. levothyroxine (SYNTHROID, LEVOTHROID) 50 MCG tablet Take 50 mcg by mouth daily.    . metoprolol succinate (TOPROL-XL) 50 MG 24 hr tablet Take 50 mg by mouth in the AM and 25 mg by mouth in the PM.Take with or immediately following a meal. 135 tablet 1  . omeprazole (PRILOSEC) 40 MG capsule Take 40 mg by mouth daily.     . quinapril (ACCUPRIL) 20 MG tablet  Take 40 mg by mouth at bedtime.     . rosuvastatin (CRESTOR) 40 MG tablet Take 40 mg by mouth daily.    . sertraline (ZOLOFT) 50 MG tablet Take 50 mg by mouth 3 (three) times daily.     . traZODone (DESYREL) 50 MG tablet Take 50 mg by mouth at bedtime as needed for sleep.     Marland Kitchen amLODipine (NORVASC) 5 MG tablet Take 1 tablet (5 mg total) by mouth daily. (Patient not taking: Reported on 02/01/2016) 90 tablet 3   No current facility-administered medications for this visit.     Allergies:   Patient has no known allergies.    Social History:  The patient  reports that she quit smoking about 33 years ago. Her smoking use included Cigarettes. She has a 12.00 pack-year smoking history. She has never used smokeless tobacco. She reports that she does not drink alcohol or use drugs.   Family History:  The  patient's family history includes Heart Problems in her son; Heart disease in her brother and sister.    ROS:  Please see the history of present illness.   Otherwise, review of systems are positive for abdominal pain.   All other systems are reviewed and negative.    PHYSICAL EXAM: VS:  BP (!) 150/90 (BP Location: Left Arm, Patient Position: Sitting, Cuff Size: Normal)   Pulse 72   Ht 5\' 4"  (1.626 m)   Wt 131 lb (59.4 kg)   BMI 22.49 kg/m  , BMI Body mass index is 22.49 kg/m. GEN: Well nourished, well developed, in no acute distress  HEENT: normal  Neck: no JVD, carotid bruits, or masses Cardiac: RRR; no murmurs, rubs, or gallops,no edema  Respiratory:  clear to auscultation bilaterally, normal work of breathing GI: soft, nontender, nondistended, + BS MS: no deformity or atrophy  Skin: warm and dry, no rash Neuro:  Strength and sensation are intact Psych: euthymic mood, full affect     Recent Labs: No results found for requested labs within last 8760 hours.   Lipid Panel    Component Value Date/Time   CHOL 190 04/08/2014 1055   TRIG 142.0 04/08/2014 1055   HDL 52.80 04/08/2014 1055   CHOLHDL 4 04/08/2014 1055   VLDL 28.4 04/08/2014 1055   LDLCALC 109 (H) 04/08/2014 1055   LDLDIRECT 90.8 01/08/2014 0828     Other studies Reviewed: Additional studies/ records that were reviewed today with results demonstrating: 2014 stress test normal.   ASSESSMENT AND PLAN:  1. CAD: Continue aspirin. No angina. No change on ECG. Negative stress test in 2014 pre surgery. 2. HTN: Elevated today.  Controlled at home, readings are in the 120-150s range.  Mostly in the 120s.  Off amlodipine due to stomach pain.  Amlodipine was ordered in 6/17 for elevated BP readings.  Continue current medications.  Continue to monitor.   3. Hyperlipidemia:  Continue Crestor. Zetia was too expensive. She did not qualify for PCS K9 inhibitor. Lipids are near target at this point. Continue to eat a  healthy diet. Try to increase exercise as well.  WIll check labs today.  She is fasting.   4.  Sleep disturbance: She does not sleep well.  This problem has persistsed. She doesn't feel trazodone works. I asked her to f/u with her new primary care doctor.  5. Carotid DOppler scheduled for May 2018 for known carotid disease.   Current medicines are reviewed at length with the patient today.  The patient concerns regarding  her medicines were addressed.  The following changes have been made:  No change  Labs/ tests ordered today include:  No orders of the defined types were placed in this encounter.   Recommend 150 minutes/week of aerobic exercise Low fat, low carb, high fiber diet recommended  Disposition:   FU in 1 year   Signed, Lance Muss, MD  02/01/2016 1:25 PM    [facility] [address] [place], [place], [place]  [address] Phone: [phone]; Fax: [phone]

## 2016-02-01 ENCOUNTER — Encounter (INDEPENDENT_AMBULATORY_CARE_PROVIDER_SITE_OTHER): Payer: Self-pay

## 2016-02-01 ENCOUNTER — Ambulatory Visit (INDEPENDENT_AMBULATORY_CARE_PROVIDER_SITE_OTHER): Payer: Medicare HMO | Admitting: Interventional Cardiology

## 2016-02-01 ENCOUNTER — Encounter: Payer: Self-pay | Admitting: Interventional Cardiology

## 2016-02-01 VITALS — BP 150/90 | HR 72 | Ht 64.0 in | Wt 131.0 lb

## 2016-02-01 DIAGNOSIS — I779 Disorder of arteries and arterioles, unspecified: Secondary | ICD-10-CM

## 2016-02-01 DIAGNOSIS — I10 Essential (primary) hypertension: Secondary | ICD-10-CM | POA: Diagnosis not present

## 2016-02-01 DIAGNOSIS — I2510 Atherosclerotic heart disease of native coronary artery without angina pectoris: Secondary | ICD-10-CM | POA: Diagnosis not present

## 2016-02-01 DIAGNOSIS — I119 Hypertensive heart disease without heart failure: Secondary | ICD-10-CM

## 2016-02-01 DIAGNOSIS — E782 Mixed hyperlipidemia: Secondary | ICD-10-CM

## 2016-02-01 DIAGNOSIS — I739 Peripheral vascular disease, unspecified: Secondary | ICD-10-CM

## 2016-02-01 NOTE — Patient Instructions (Signed)
Medication Instructions:  Same-no changes  Labwork: Topday-Lipids and CMET  Testing/Procedures: None  Follow-Up: Your physician wants you to follow-up in: 1 year. You will receive a reminder letter in the mail two months in advance. If you don't receive a letter, please call our office to schedule the follow-up appointment.     If you need a refill on your cardiac medications before your next appointment, please call your pharmacy.

## 2016-02-02 LAB — COMPREHENSIVE METABOLIC PANEL
ALT: 20 IU/L (ref 0–32)
AST: 30 IU/L (ref 0–40)
Albumin/Globulin Ratio: 2.1 (ref 1.2–2.2)
Albumin: 4.7 g/dL (ref 3.6–4.8)
Alkaline Phosphatase: 97 IU/L (ref 39–117)
BUN/Creatinine Ratio: 16 (ref 12–28)
BUN: 11 mg/dL (ref 8–27)
Bilirubin Total: 0.5 mg/dL (ref 0.0–1.2)
CALCIUM: 9.6 mg/dL (ref 8.7–10.3)
CO2: 29 mmol/L (ref 18–29)
CREATININE: 0.7 mg/dL (ref 0.57–1.00)
Chloride: 102 mmol/L (ref 96–106)
GFR, EST AFRICAN AMERICAN: 103 mL/min/{1.73_m2} (ref >59)
GFR, EST NON AFRICAN AMERICAN: 89 mL/min/{1.73_m2} (ref >59)
GLOBULIN, TOTAL: 2.2 g/dL (ref 1.5–4.5)
Glucose: 95 mg/dL (ref 65–99)
Potassium: 3.5 mmol/L (ref 3.5–5.2)
SODIUM: 144 mmol/L (ref 134–144)
Total Protein: 6.9 g/dL (ref 6.0–8.5)

## 2016-02-02 LAB — LIPID PANEL
Chol/HDL Ratio: 5.1 ratio units — ABNORMAL HIGH (ref 0.0–4.4)
Cholesterol, Total: 239 mg/dL — ABNORMAL HIGH (ref 100–199)
HDL: 47 mg/dL (ref >39)
LDL Calculated: 143 mg/dL — ABNORMAL HIGH (ref 0–99)
TRIGLYCERIDES: 247 mg/dL — AB (ref 0–149)
VLDL CHOLESTEROL CAL: 49 mg/dL — AB (ref 5–40)

## 2016-02-03 ENCOUNTER — Telehealth: Payer: Self-pay | Admitting: *Deleted

## 2016-02-03 NOTE — Telephone Encounter (Signed)
Left message to call back for lab results and discuss ORION10 trial.

## 2016-02-04 ENCOUNTER — Telehealth: Payer: Self-pay | Admitting: Interventional Cardiology

## 2016-02-04 NOTE — Telephone Encounter (Signed)
Dawn Boyer is returning a call . Thanks

## 2016-02-04 NOTE — Telephone Encounter (Signed)
The pt has been given her lab results. She verbalized understanding. 

## 2016-04-12 DIAGNOSIS — I10 Essential (primary) hypertension: Secondary | ICD-10-CM | POA: Diagnosis not present

## 2016-04-12 DIAGNOSIS — I2510 Atherosclerotic heart disease of native coronary artery without angina pectoris: Secondary | ICD-10-CM | POA: Diagnosis not present

## 2016-04-12 DIAGNOSIS — E782 Mixed hyperlipidemia: Secondary | ICD-10-CM | POA: Diagnosis not present

## 2016-04-12 DIAGNOSIS — G44209 Tension-type headache, unspecified, not intractable: Secondary | ICD-10-CM | POA: Diagnosis not present

## 2016-04-12 DIAGNOSIS — E039 Hypothyroidism, unspecified: Secondary | ICD-10-CM | POA: Diagnosis not present

## 2016-04-12 DIAGNOSIS — R7303 Prediabetes: Secondary | ICD-10-CM | POA: Diagnosis not present

## 2016-04-17 ENCOUNTER — Other Ambulatory Visit: Payer: Self-pay | Admitting: Interventional Cardiology

## 2016-04-18 ENCOUNTER — Telehealth: Payer: Self-pay | Admitting: Pharmacist

## 2016-04-18 MED ORDER — EZETIMIBE 10 MG PO TABS: 10.0000 mg | ORAL_TABLET | Freq: Every day | ORAL | 3 refills | Status: DC

## 2016-04-18 NOTE — Telephone Encounter (Signed)
Discussed lipid panel results with patient. LDL 145 above goal < 70 given hx of CAD.Spoke with patient and she confirmed adherence with Crestor  daily. Zetia has previously been cost prohibitive but has been generic for long enough that price has likely come down since then. Spoke with pt and she is willing to start Zetia  daily in addition to her Crestor. She will call if cost is prohibitive. Scheduled follow up lab work in 3 months. If LDL is still elevated at that time, can discuss ORION trial in the fall or PCSK9i.

## 2016-05-04 DIAGNOSIS — H524 Presbyopia: Secondary | ICD-10-CM | POA: Diagnosis not present

## 2016-05-12 ENCOUNTER — Ambulatory Visit: Payer: Commercial Managed Care - HMO | Admitting: Family

## 2016-05-12 ENCOUNTER — Encounter (HOSPITAL_COMMUNITY): Payer: Commercial Managed Care - HMO

## 2016-05-15 DIAGNOSIS — H02413 Mechanical ptosis of bilateral eyelids: Secondary | ICD-10-CM | POA: Diagnosis not present

## 2016-05-24 ENCOUNTER — Encounter: Payer: Self-pay | Admitting: Family

## 2016-06-02 ENCOUNTER — Ambulatory Visit: Payer: Self-pay | Admitting: Family

## 2016-06-02 ENCOUNTER — Encounter (HOSPITAL_COMMUNITY): Payer: Medicare HMO

## 2016-06-29 ENCOUNTER — Telehealth: Payer: Self-pay

## 2016-06-29 NOTE — Telephone Encounter (Signed)
Request for surgical clearance:  1. What type of surgery is being performed? Blepharoplasty, Both Upper Lids-Functional-CEA under IV sedation   2. When is this surgery scheduled? 07/10/16   3. Are there any medications that need to be held prior to surgery and how long? Aspirin 14 days before surgery   4. Name of physician performing surgery? Dr. Joycelyn DasUsiwoma Abugo   5. What is your office phone and fax number? P- [phone], [phone]- [phone]

## 2016-06-29 NOTE — Telephone Encounter (Signed)
Call[facility] and [facility] with [facility]aren at [facility] and informed her that the clearance request was faxed over 12 days prior to the patient's surgery and they were requesting a 14 day hold on ASA. Clydie BraunKaren called me back and stated that Dr. Harvel QualeAbugo said that the patient will only need to hold ASA for 7 days prior to surgery. Made her aware that the information would be forwarded to MD.

## 2016-06-30 NOTE — Telephone Encounter (Signed)
Clearance faxed to Dr. Othelia PullingAbugo's office. Confirmation received that it went through.

## 2016-06-30 NOTE — Telephone Encounter (Signed)
OK to hold aspirin as needed for the eye surgery.  No further cardiac testing required.

## 2016-07-10 DIAGNOSIS — H02831 Dermatochalasis of right upper eyelid: Secondary | ICD-10-CM | POA: Diagnosis not present

## 2016-07-10 DIAGNOSIS — H02413 Mechanical ptosis of bilateral eyelids: Secondary | ICD-10-CM | POA: Diagnosis not present

## 2016-07-10 DIAGNOSIS — H02834 Dermatochalasis of left upper eyelid: Secondary | ICD-10-CM | POA: Diagnosis not present

## 2016-07-24 ENCOUNTER — Encounter: Payer: Self-pay | Admitting: Family

## 2016-07-31 ENCOUNTER — Telehealth: Payer: Self-pay | Admitting: Interventional Cardiology

## 2016-07-31 NOTE — Telephone Encounter (Signed)
Patient calling because she is scheduled for labs tomorrow. She states that she is going to see a [facility] [place] and will have labs drawn there and wants to know if she needs to keep her lab appointment tomorrow. Lab appointment made [facility] Margaretmary [facility]egan Supp[facility], [facility] to recheck LIPIDS and LFTs. Please advise

## 2016-07-31 NOTE — Telephone Encounter (Signed)
New Message  Pt call requesting to speak with RN about appt. On 7/31 for lab work. Pt states she has a new pt appt coming up at the end of august and will be getting labs completed there. Pt would like to know if she needs to get the labs completed for tomorrow. Please call back to discuss

## 2016-07-31 NOTE — Telephone Encounter (Signed)
Called pt and advised her it's ok to have lipid panel drawn at her PCP in the end of August. Advised her that we would like her LDL < 70. If LDL remains above goal, will discuss PCSK9i or clinical trials. Provided pt with our fax # so they can forward results to us and canceled labs at [facility].

## 2016-08-01 ENCOUNTER — Other Ambulatory Visit: Payer: Medicare HMO

## 2016-08-02 ENCOUNTER — Ambulatory Visit: Payer: Medicare HMO | Admitting: Family

## 2016-08-02 ENCOUNTER — Encounter (HOSPITAL_COMMUNITY): Payer: Medicare HMO

## 2016-08-05 IMAGING — CT CT ABD-PELV W/ CM
2 of 5 series · 15 of 46 positions shown, 17 images · IV contrast (APPLIED)
Comparison: None.

CLINICAL DATA: LOWER ABD PAIN

HX OF DIVERTICULOSIS/CONSTIPATIONSYMPTOMS X > 3 WEEKS
HEMORRHOIDECTOMY/CHOLECYSTECTOMY
NO INJURY
NO HX CA
EXAM:
CT ABDOMEN AND PELVIS WITH CONTRAST
TECHNIQUE: Multidetector CT imaging of the abdomen and pelvis was performed
using the standard protocol following bolus administration of
intravenous contrast.
CONTRAST:  100 mL of 2sovue-KVV intravenous contrast

[Series 2: abd/pelvis w/cm · axial · 0.67mm/px · z∈[-448,+2]mm · 12 of 101 slices shown, 14 images]
[im 6/101  soft-tissue]
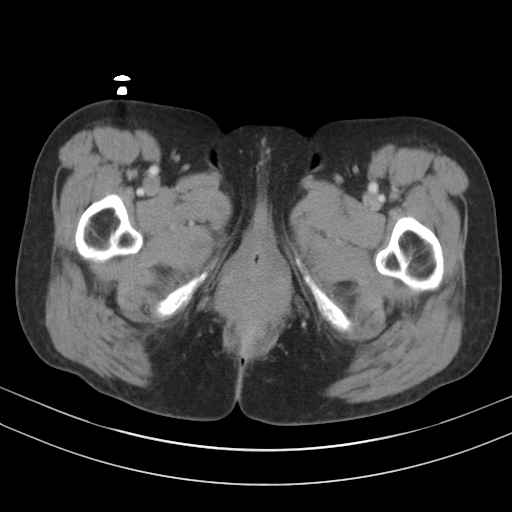
[im 6/101  bone]
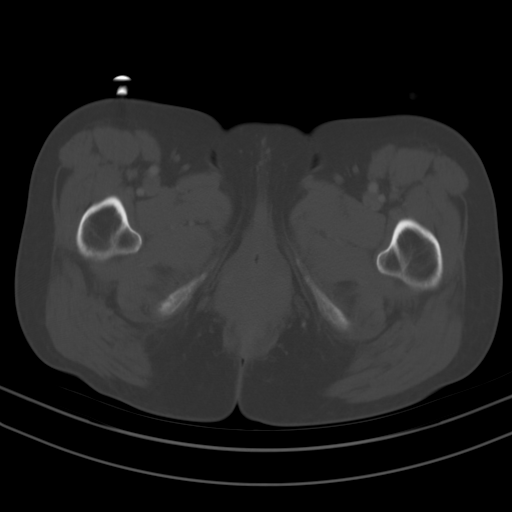
[im 16/101  soft-tissue]
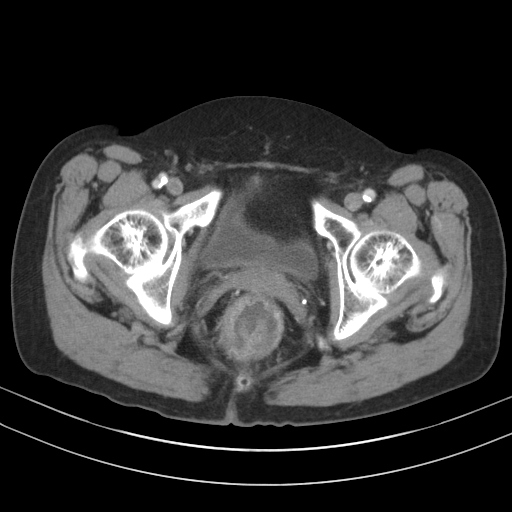
[im 21/101  soft-tissue]
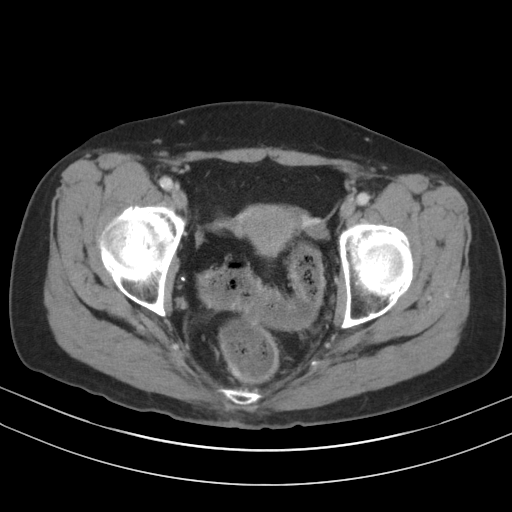
[im 31/101  soft-tissue]
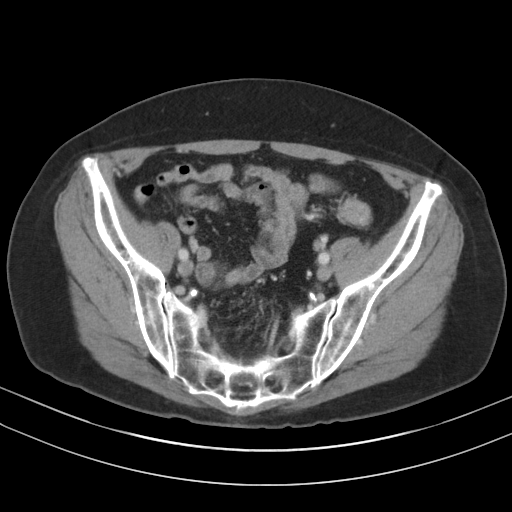
[im 41/101  soft-tissue]
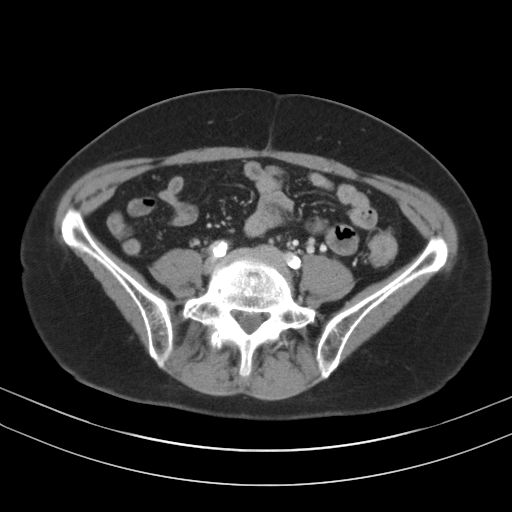
[im 46/101  soft-tissue]
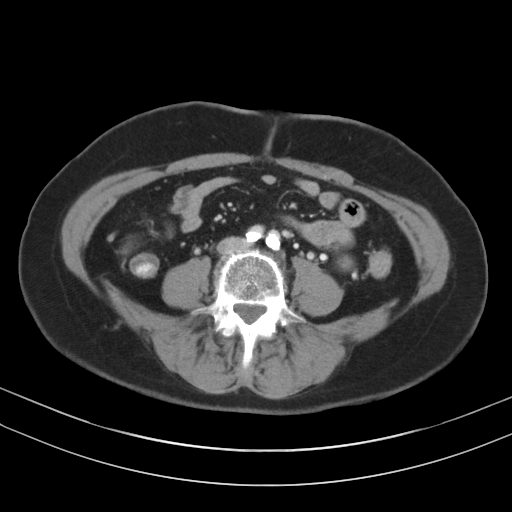
[im 56/101  soft-tissue]
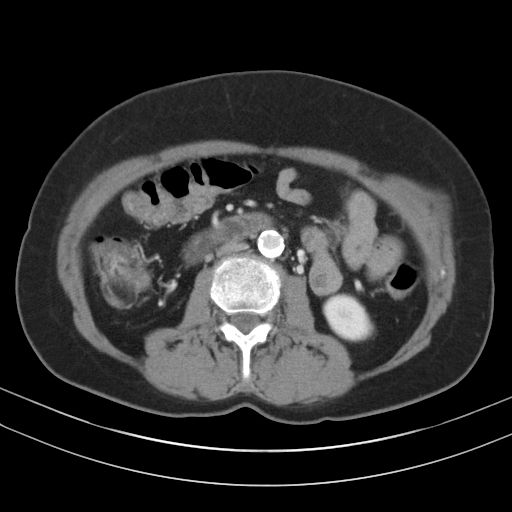
[im 61/101  soft-tissue]
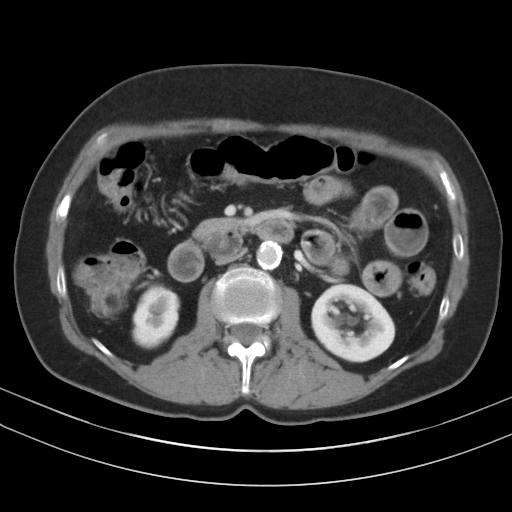
[im 71/101  soft-tissue]
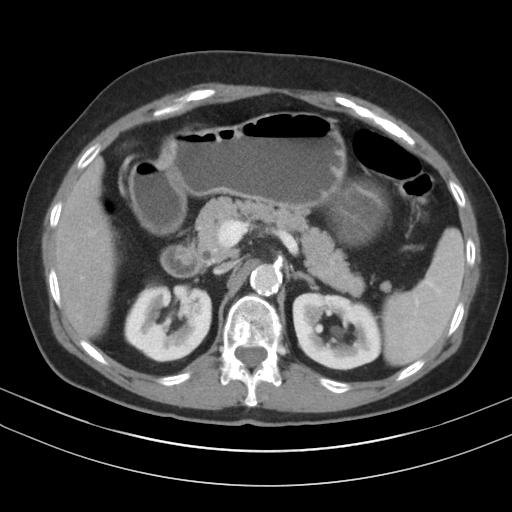
[im 71/101  bone]
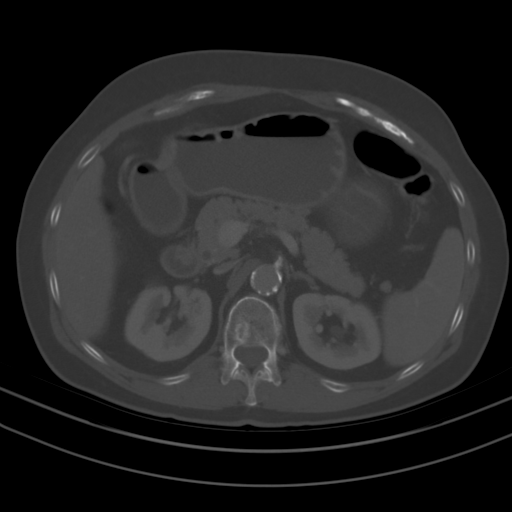
[im 81/101  soft-tissue]
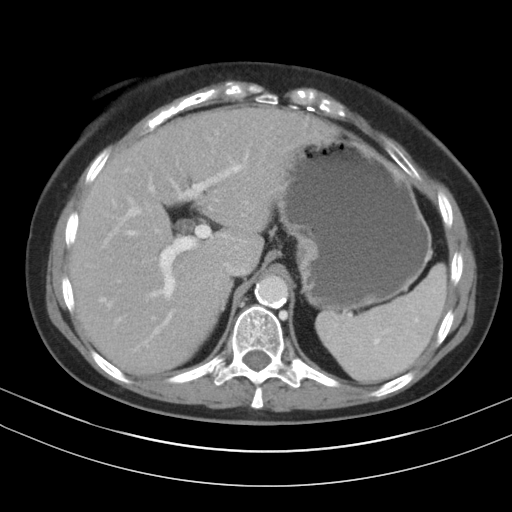
[im 86/101  soft-tissue]
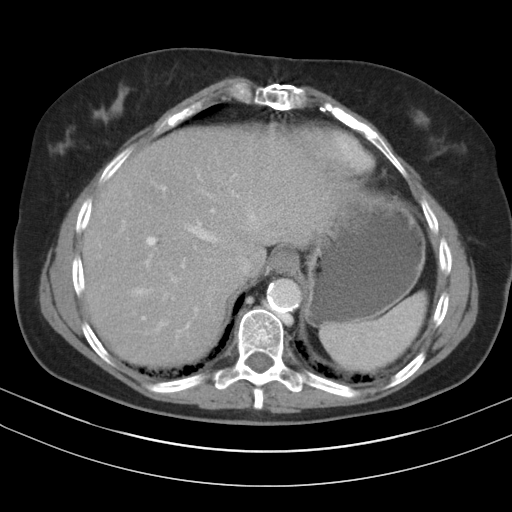
[im 96/101  soft-tissue]
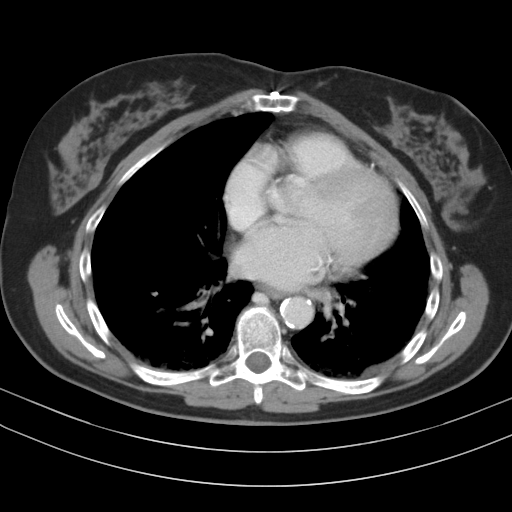

[Series 3: cor · coronal · 0.77mm/px · 3 of 76 slices shown]
[im 26/76  soft-tissue]
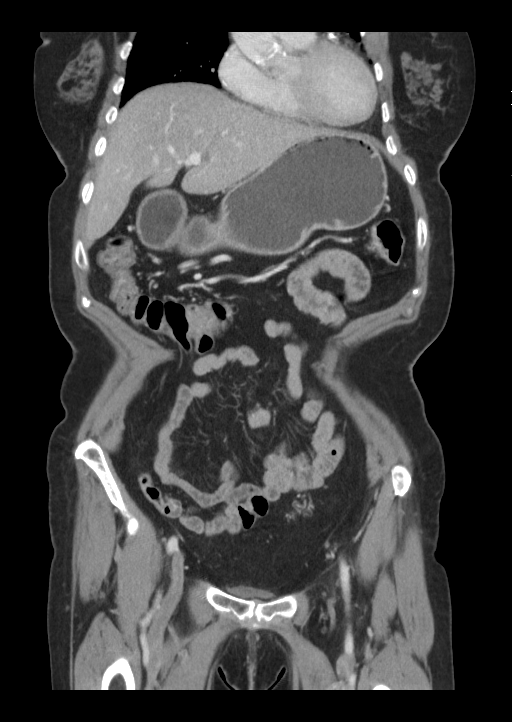
[im 34/76  soft-tissue]
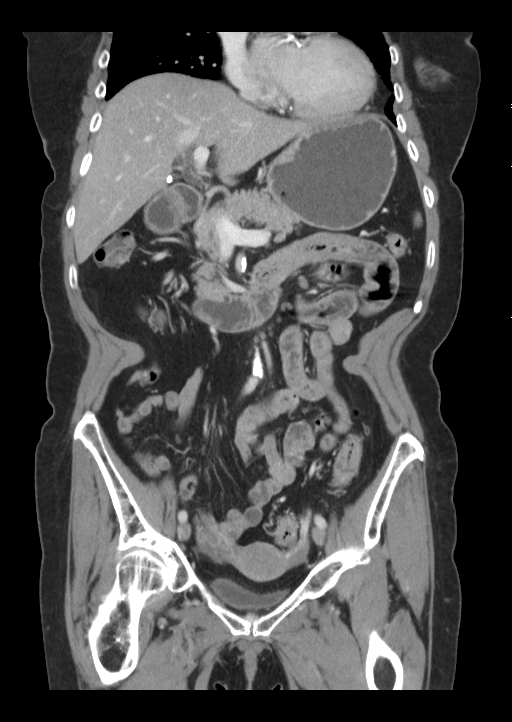
[im 42/76  soft-tissue]
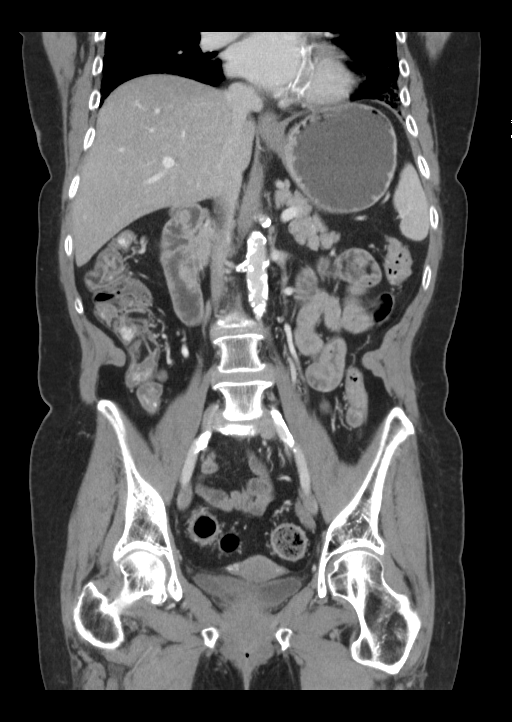

[15 of 46 positions shown; findings below may reference images not displayed]

FINDINGS: Lung bases: Heart normal in size. There are dense coronary artery
calcifications and changes from previous CABG surgery. There is
dependent coarse reticular and ground-glass type opacity in the
lower lobes most likely atelectasis. There may be a component of
scarring. No convincing pneumonia or pulmonary edema. No pleural
effusion.

There is generalized prominence of the mesenteric vascular to
sigmoid colon without overt inflammation. There is no colonic wall
thickening. There is no dilation to suggest obstruction or
significant adynamic ileus. No discrete diverticula are seen.
Stomach is moderately distended but otherwise unremarkable. Normal
small bowel. Normal appendix is visualized.

Liver and spleen:  Unremarkable.

Gallbladder and biliary tree: Gallbladder surgically absent. No bile
duct dilation.

Pancreas:  Unremarkable.

Adrenal glands:  No masses.

Kidneys, ureters, bladder: 2 right renal cysts, largest measured
cm arising the anterior midpole. There other tiny low-density
lesions in each kidney that are likely additional cysts. No other
renal masses or lesions. Small nonobstructing stone in the upper
pole the right kidney. No other intrarenal stones. No
hydronephrosis. Normal ureters. Bladder is unremarkable.

Uterus and adnexa:  Unremarkable.

Lymph nodes:  No adenopathy.

Ascites:  None.

Vascular: Dense atherosclerotic calcifications noted along a normal
caliber abdominal aorta and along its branch vessels.

Musculoskeletal: Mild compression fracture of T12, which appears
old. There are disc degenerative changes most evident at L4-L5. No
osteoblastic or osteolytic lesions.
IMPRESSION: 1. No acute findings within the abdomen or pelvis. Although there is
prominence of the sigmoid colon mesenteric vasculature which is
nonspecific, there is no convincing inflammation and no CT evidence
of diverticulitis. There is no bowel obstruction or evidence of
generalized adynamic ileus.
2. Normal appendix visualized.
3. Lung base opacity most consistent with atelectasis. There may be
a component of scarring.
4. Status post cholecystectomy.
5. Correction fracture, mild, T12, most likely old.

## 2016-08-31 DIAGNOSIS — I10 Essential (primary) hypertension: Secondary | ICD-10-CM | POA: Diagnosis not present

## 2016-08-31 DIAGNOSIS — R5383 Other fatigue: Secondary | ICD-10-CM | POA: Diagnosis not present

## 2016-08-31 DIAGNOSIS — E039 Hypothyroidism, unspecified: Secondary | ICD-10-CM | POA: Diagnosis not present

## 2016-08-31 DIAGNOSIS — E782 Mixed hyperlipidemia: Secondary | ICD-10-CM | POA: Diagnosis not present

## 2016-08-31 DIAGNOSIS — F411 Generalized anxiety disorder: Secondary | ICD-10-CM | POA: Diagnosis not present

## 2016-08-31 DIAGNOSIS — K5900 Constipation, unspecified: Secondary | ICD-10-CM | POA: Diagnosis not present

## 2016-10-18 DIAGNOSIS — K5900 Constipation, unspecified: Secondary | ICD-10-CM | POA: Diagnosis not present

## 2016-10-18 DIAGNOSIS — I10 Essential (primary) hypertension: Secondary | ICD-10-CM | POA: Diagnosis not present

## 2016-10-18 DIAGNOSIS — M542 Cervicalgia: Secondary | ICD-10-CM | POA: Diagnosis not present

## 2016-10-25 ENCOUNTER — Encounter: Payer: Self-pay | Admitting: *Deleted

## 2016-10-31 ENCOUNTER — Ambulatory Visit: Payer: Medicare HMO | Admitting: Internal Medicine

## 2016-11-10 DIAGNOSIS — R252 Cramp and spasm: Secondary | ICD-10-CM | POA: Diagnosis not present

## 2016-11-10 DIAGNOSIS — F411 Generalized anxiety disorder: Secondary | ICD-10-CM | POA: Diagnosis not present

## 2016-11-10 DIAGNOSIS — M546 Pain in thoracic spine: Secondary | ICD-10-CM | POA: Diagnosis not present

## 2017-01-18 DIAGNOSIS — F320 Major depressive disorder, single episode, mild: Secondary | ICD-10-CM | POA: Diagnosis not present

## 2017-01-18 DIAGNOSIS — F411 Generalized anxiety disorder: Secondary | ICD-10-CM | POA: Diagnosis not present

## 2017-01-18 DIAGNOSIS — K5900 Constipation, unspecified: Secondary | ICD-10-CM | POA: Diagnosis not present

## 2017-01-18 DIAGNOSIS — I10 Essential (primary) hypertension: Secondary | ICD-10-CM | POA: Diagnosis not present

## 2017-03-27 ENCOUNTER — Encounter: Payer: Self-pay | Admitting: Interventional Cardiology

## 2017-03-27 ENCOUNTER — Ambulatory Visit: Payer: Medicare HMO | Admitting: Interventional Cardiology

## 2017-03-27 VITALS — BP 130/90 | HR 74 | Ht 64.0 in | Wt 129.0 lb

## 2017-03-27 DIAGNOSIS — I6522 Occlusion and stenosis of left carotid artery: Secondary | ICD-10-CM | POA: Diagnosis not present

## 2017-03-27 DIAGNOSIS — I10 Essential (primary) hypertension: Secondary | ICD-10-CM | POA: Diagnosis not present

## 2017-03-27 DIAGNOSIS — I25118 Atherosclerotic heart disease of native coronary artery with other forms of angina pectoris: Secondary | ICD-10-CM

## 2017-03-27 DIAGNOSIS — E782 Mixed hyperlipidemia: Secondary | ICD-10-CM

## 2017-03-27 MED ORDER — NITROGLYCERIN 0.4 MG SL SUBL: 0.4000 mg | SUBLINGUAL_TABLET | SUBLINGUAL | 3 refills | Status: DC | PRN

## 2017-03-27 NOTE — H&P (View-Only) (Signed)
Cardiology Office Note   Date:  03/27/2017   ID:  Dawn Boyer, DOB 05/28/1945, MRN [id number]  PCP:  Farris HasMorrow, Aaron, MD    No chief complaint on file.  CAD  Wt Readings from Last 3 Encounters:  03/27/17 129 lb (58.5 kg)  02/01/16 131 lb (59.4 kg)  06/03/15 131 lb 9.6 oz (59.7 kg)       History of Present Illness: Dawn Boyer is a 72 y.o. female   who had CABG in 1994. She had throat tightness with walking  At that time. She had great difficulty walking.     In the past, she has occasionally had chest tightness with anxiety;however, with walking, she did not have any anginal sx.  She has not been exercising.    Her son passed away at age 70 from fentanyl + alcohol.   She has been bothered more by difficulty sleeping. This is followed by her primary care doctor. She doesn't feel trazodone helped her in the past.  Denies :  Dizziness. Leg edema. Nitroglycerin use. Orthopnea. Palpitations. Paroxysmal nocturnal dyspnea. Shortness of breath. Syncope.   She has had occasional throat tightness when she exerts, not nearly as bad as when she had CABG.  She has not had to stop what she was doing.        Past Medical History:  Diagnosis Date  . Anemia    hx  . Anxiety   . Arthritis   . Carotid arterial disease (HCC)    a. 04/2015 Carotid U/S: RICA 1-39%, LICA 40-59%, no change.  . Coronary artery disease    a. 1994 s/p CABG;  b. 03/2012 MV: No ischemia/infarct.  . Depression   . Diverticulosis   . Elevated LFTs   . Fibrocystic breast   . GERD (gastroesophageal reflux disease)   . Hemorrhoid   . Hemorrhoids   . History of hemolysis, elevated liver enzymes, and low platelet (HELLP) syndrome, currently pregnant    patient denies history  . Hyperlipidemia   . Hypertensive heart disease   . Hypothyroidism   . Insomnia   . Nephrolithiasis   . PONV (postoperative nausea and vomiting)    after heart surgery    Past Surgical History:  Procedure Laterality Date  .  CARDIAC CATHETERIZATION    . CHOLECYSTECTOMY  2002  . CORONARY ARTERY BYPASS GRAFT  1993  . EVALUATION UNDER ANESTHESIA WITH HEMORRHOIDECTOMY N/A 03/14/2012   Procedure: EXAM UNDER ANESTHESIA WITH Excisional HEMORRHOIDECTOMY and Hemorrhoidal banding;  Surgeon: Atilano InaEric M Wilson, MD;  [facility];  Service: General;  Laterality: N/A;  x 2 hemorrhoids  . HEMORRHOID SURGERY    . TUBAL LIGATION  1970     Current Outpatient Medications  Medication Sig Dispense Refill  . ALPRAZolam (XANAX) 1 MG tablet Take 1 mg by mouth daily.     Marland Kitchen. amLODipine (NORVASC) 5 MG tablet Take 1 tablet (5 mg total) by mouth daily. 90 tablet 3  . aspirin 325 MG tablet Take 325 mg by mouth daily.    Marland Kitchen. levothyroxine (SYNTHROID, LEVOTHROID) 50 MCG tablet Take 50 mcg by mouth daily.    . metoprolol succinate (TOPROL-XL) 50 MG 24 hr tablet Take 1 tablet by mouth in the morning and 1/2 tablet by mouth in the evening with or immediately following a meal. 135 tablet 2  . omeprazole (PRILOSEC) 40 MG capsule Take 40 mg by mouth daily.     . quinapril (ACCUPRIL) 20 MG tablet Take 40 mg by  mouth at bedtime.     . rosuvastatin (CRESTOR) 40 MG tablet Take 40 mg by mouth daily.    . sertraline (ZOLOFT) 100 MG tablet Take 100 mg by mouth. Take 50 mg in by mouth in am and 100 mg by mouth in the pm    . tiZANidine (ZANAFLEX) 4 MG tablet Take 4 mg by mouth daily.     No current facility-administered medications for this visit.     Allergies:   Patient has no known allergies.    Social History:  The patient  reports that she quit smoking about 34 years ago. Her smoking use included cigarettes. She has a 12.00 pack-year smoking history. She has never used smokeless tobacco. She reports that she does not drink alcohol or use drugs.   Family History:  The patient's family history includes Diabetes in her daughter; Heart Problems in her son; Heart disease in her brother and sister.    ROS:  Please see the history of present illness.    Otherwise, review of systems are positive for chest pain.   All other systems are reviewed and negative.    PHYSICAL EXAM: VS:  BP 130/90   Pulse 74   Ht 5\' 4"  (1.626 m)   Wt 129 lb (58.5 kg)   SpO2 95%   BMI 22.14 kg/m  , BMI Body mass index is 22.14 kg/m. GEN: Well nourished, well developed, in no acute distress  HEENT: normal  Neck: no JVD, carotid bruits, or masses Cardiac: RRR; no murmurs, rubs, or gallops,no edema ; 2+ left radial pulse Respiratory:  clear to auscultation bilaterally, normal work of breathing GI: soft, nontender, nondistended, + BS MS: no deformity or atrophy  Skin: warm and dry, no rash Neuro:  Strength and sensation are intact Psych: euthymic mood, full affect   EKG:   The ekg ordered today demonstrates NSR, PVC, diffuse inferior/lateral T wave inversions.persistent   Recent Labs: No results found for requested labs within last 8760 hours.   Lipid Panel    Component Value Date/Time   CHOL 239 (H) 02/01/2016 1351   TRIG 247 (H) 02/01/2016 1351   HDL 47 02/01/2016 1351   CHOLHDL 5.1 (H) 02/01/2016 1351   CHOLHDL 4 04/08/2014 1055   VLDL 28.4 04/08/2014 1055   LDLCALC 143 (H) 02/01/2016 1351   LDLDIRECT 90.8 01/08/2014 0828     Other studies Reviewed: Additional studies/ records that were reviewed today with results demonstrating: moderate left ICA stenosis, 40-59% in 2017.   ASSESSMENT AND PLAN:  1. CAD: Sx concerning for angina.  Risks and benefits of cath explained to the patient.  She is wiling to have the cath done.  SHe is on 2 antianginals with class 3 sx.  CABG x 3, 25 years ago. 2. HTN: The current medical regimen is effective;  continue present plan and medications. 3. Hyperlipidemia: Has been on High dose Crestor and Zetia.  No longer on Zetia due to insurance.  LDL 121 in 2018.  May need to consider PCSK9 inhibitor.  4. Carotid Disease: Doppler as noted above.    Current medicines are reviewed at length with the patient today.   The patient concerns regarding her medicines were addressed.  The following changes have been made:  No change  Labs/ tests ordered today include:  No orders of the defined types were placed in this encounter.   Recommend 150 minutes/week of aerobic exercise Low fat, low carb, high fiber diet recommended  Disposition:   FU  for cath   Signed, Lance Muss, MD  03/27/2017 3:06 PM    [facility] [address] [place], [place], [place]  [address] Phone: [phone]; Fax: [phone]

## 2017-03-27 NOTE — Patient Instructions (Addendum)
Medication Instructions:  Your physician recommends that you continue on your current medications as directed. Please refer to the Current Medication list given to you today.  A prescription was sent to your pharmacy for Sublingual Nitroglycerin. Take as directed.  Labwork: TODAY: CBC, BMET, PT/INR  Testing/Procedures: Your physician has requested that you have a cardiac catheterization on 04/03/17. Cardiac catheterization is used to diagnose and/or treat various heart conditions. Doctors may recommend this procedure for a number of different reasons. The most common reason is to evaluate chest pain. Chest pain can be a symptom of coronary artery disease (CAD), and cardiac catheterization can show whether plaque is narrowing or blocking your heart's arteries. This procedure is also used to evaluate the valves, as well as measure the blood flow and oxygen levels in different parts of your heart. For further information please visit [email or web address] Please follow instruction sheet, as given.  Follow-Up: Your physician recommends that you schedule a follow-up appointment 2-3 weeks after heart catheterization with Dr. Eldridge DaceVaranasi or APP on his team    Any Other Special Instructions Will Be Listed Below (If Applicable).    [facility] [address] [facility] [address], [address] [place] [address] Dept: [phone] Loc: [phone]  Dawn Boyer  03/27/2017  You are scheduled for a Cardiac Catheterization on Tuesday, April 2 with Dr. Lance MussJayadeep Varanasi.  1. [facility] at [facility]: [address] [place], [place] [address] at 8:30 AM (two hours before your procedure to ensure your preparation). Free valet parking service is available.   Special note: Every effort is made to have your procedure done on time. Please understand that emergencies sometimes delay scheduled  procedures.  2. Diet: Do not eat or drink anything after midnight prior to your procedure except sips of water to take medications.  3. Labs: LABS TODAY: BMET, CBC, PT/INR  4. Medication instructions in preparation for your procedure:  On the morning of your procedure you may take all of your morning medicines including your Aspirin with a sip of water.  5. Plan for one night stay--bring personal belongings. 6. Bring a current list of your medications and current insurance cards. 7. You MUST have a responsible person to drive you home. 8. Someone MUST be with you the first 24 hours after you arrive home or your discharge will be delayed. 9. Please wear clothes that are easy to get on and off and wear slip-on shoes.  Thank you for allowing us to care for you!   -- [facility]    If you need a refill on your cardiac medications before your next appointment, please call your pharmacy.  Nitroglycerin sublingual tablets What is this medicine? NITROGLYCERIN (nye troe GLI ser in) is a type of vasodilator. It relaxes blood vessels, increasing the blood and oxygen supply to your heart. This medicine is used to relieve chest pain caused by angina. It is also used to prevent chest pain before activities like climbing stairs, going outdoors in cold weather, or sexual activity. This medicine may be used for other purposes; ask your health care provider or pharmacist if you have questions. COMMON BRAND NAME(S): Nitroquick, Nitrostat, Nitrotab What should I tell my health care provider before I take this medicine? They need to know if you have any of these conditions: -anemia -head injury, recent stroke, or bleeding in the brain -liver disease -previous heart attack -  an unusual or allergic reaction to nitroglycerin, other medicines, foods, dyes, or preservatives -pregnant or trying to get pregnant -breast-feeding How should I use this medicine? Take this  medicine by mouth as needed. At the first sign of an angina attack (chest pain or tightness) place one tablet under your tongue. You can also take this medicine 5 to 10 minutes before an event likely to produce chest pain. Follow the directions on the prescription label. Let the tablet dissolve under the tongue. Do not swallow whole. Replace the dose if you accidentally swallow it. It will help if your mouth is not dry. Saliva around the tablet will help it to dissolve more quickly. Do not eat or drink, smoke or chew tobacco while a tablet is dissolving. If you are not better within 5 minutes after taking ONE dose of nitroglycerin, call 9-1-1 immediately to seek emergency medical care. Do not take more than 3 nitroglycerin tablets over 15 minutes. If you take this medicine often to relieve symptoms of angina, your doctor or health care professional may provide you with different instructions to manage your symptoms. If symptoms do not go away after following these instructions, it is important to call 9-1-1 immediately. Do not take more than 3 nitroglycerin tablets over 15 minutes. Talk to your pediatrician regarding the use of this medicine in children. Special care may be needed. Overdosage: If you think you have taken too much of this medicine contact a poison control center or emergency room at once. NOTE: This medicine is only for you. Do not share this medicine with others. What if I miss a dose? This does not apply. This medicine is only used as needed. What may interact with this medicine? Do not take this medicine with any of the following medications: -certain migraine medicines like ergotamine and dihydroergotamine (DHE) -medicines used to treat erectile dysfunction like sildenafil, tadalafil, and vardenafil -riociguat This medicine may also interact with the following medications: -alteplase -aspirin -heparin -medicines for high blood pressure -medicines for mental depression -other  medicines used to treat angina -phenothiazines like chlorpromazine, mesoridazine, prochlorperazine, thioridazine This list may not describe all possible interactions. Give your health care provider a list of all the medicines, herbs, non-prescription drugs, or dietary supplements you use. Also tell them if you smoke, drink alcohol, or use illegal drugs. Some items may interact with your medicine. What should I watch for while using this medicine? Tell your doctor or health care professional if you feel your medicine is no longer working. Keep this medicine with you at all times. Sit or lie down when you take your medicine to prevent falling if you feel dizzy or faint after using it. Try to remain calm. This will help you to feel better faster. If you feel dizzy, take several deep breaths and lie down with your feet propped up, or bend forward with your head resting between your knees. You may get drowsy or dizzy. Do not drive, use machinery, or do anything that needs mental alertness until you know how this drug affects you. Do not stand or sit up quickly, especially if you are an older patient. This reduces the risk of dizzy or fainting spells. Alcohol can make you more drowsy and dizzy. Avoid alcoholic drinks. Do not treat yourself for coughs, colds, or pain while you are taking this medicine without asking your doctor or health care professional for advice. Some ingredients may increase your blood pressure. What side effects may I notice from receiving this medicine? Side  effects that you should report to your doctor or health care professional as soon as possible: -blurred vision -dry mouth -skin rash -sweating -the feeling of extreme pressure in the head -unusually weak or tired Side effects that usually do not require medical attention (report to your doctor or health care professional if they continue or are bothersome): -flushing of the face or neck -headache -irregular heartbeat,  palpitations -nausea, vomiting This list may not describe all possible side effects. Call your doctor for medical advice about side effects. You may report side effects to FDA at 1-800-FDA-1088. Where should I keep my medicine? Keep out of the reach of children. Store at room temperature between 20 and 25 degrees C (68 and 77 degrees F). Store in Retail buyer. Protect from light and moisture. Keep tightly closed. Throw away any unused medicine after the expiration date. NOTE: This sheet is a summary. It may not cover all possible information. If you have questions about this medicine, talk to your doctor, pharmacist, or health care provider.  2018 Elsevier/Gold Standard (2012-10-17 17:57:36)

## 2017-03-27 NOTE — Progress Notes (Signed)
Cardiology Office Note   Date:  03/27/2017   ID:  Dawn Boyer, DOB 05/28/1945, MRN [id number]  PCP:  Farris HasMorrow, Aaron, MD    No chief complaint on file.  CAD  Wt Readings from Last 3 Encounters:  03/27/17 129 lb (58.5 kg)  02/01/16 131 lb (59.4 kg)  06/03/15 131 lb 9.6 oz (59.7 kg)       History of Present Illness: Dawn Boyer is a 72 y.o. female   who had CABG in 1994. She had throat tightness with walking  At that time. She had great difficulty walking.     In the past, she has occasionally had chest tightness with anxiety;however, with walking, she did not have any anginal sx.  She has not been exercising.    Her son passed away at age 70 from fentanyl + alcohol.   She has been bothered more by difficulty sleeping. This is followed by her primary care doctor. She doesn't feel trazodone helped her in the past.  Denies :  Dizziness. Leg edema. Nitroglycerin use. Orthopnea. Palpitations. Paroxysmal nocturnal dyspnea. Shortness of breath. Syncope.   She has had occasional throat tightness when she exerts, not nearly as bad as when she had CABG.  She has not had to stop what she was doing.        Past Medical History:  Diagnosis Date  . Anemia    hx  . Anxiety   . Arthritis   . Carotid arterial disease (HCC)    a. 04/2015 Carotid U/S: RICA 1-39%, LICA 40-59%, no change.  . Coronary artery disease    a. 1994 s/p CABG;  b. 03/2012 MV: No ischemia/infarct.  . Depression   . Diverticulosis   . Elevated LFTs   . Fibrocystic breast   . GERD (gastroesophageal reflux disease)   . Hemorrhoid   . Hemorrhoids   . History of hemolysis, elevated liver enzymes, and low platelet (HELLP) syndrome, currently pregnant    patient denies history  . Hyperlipidemia   . Hypertensive heart disease   . Hypothyroidism   . Insomnia   . Nephrolithiasis   . PONV (postoperative nausea and vomiting)    after heart surgery    Past Surgical History:  Procedure Laterality Date  .  CARDIAC CATHETERIZATION    . CHOLECYSTECTOMY  2002  . CORONARY ARTERY BYPASS GRAFT  1993  . EVALUATION UNDER ANESTHESIA WITH HEMORRHOIDECTOMY N/A 03/14/2012   Procedure: EXAM UNDER ANESTHESIA WITH Excisional HEMORRHOIDECTOMY and Hemorrhoidal banding;  Surgeon: Atilano InaEric M Wilson, MD;  [facility];  Service: General;  Laterality: N/A;  x 2 hemorrhoids  . HEMORRHOID SURGERY    . TUBAL LIGATION  1970     Current Outpatient Medications  Medication Sig Dispense Refill  . ALPRAZolam (XANAX) 1 MG tablet Take 1 mg by mouth daily.     Marland Kitchen. amLODipine (NORVASC) 5 MG tablet Take 1 tablet (5 mg total) by mouth daily. 90 tablet 3  . aspirin 325 MG tablet Take 325 mg by mouth daily.    Marland Kitchen. levothyroxine (SYNTHROID, LEVOTHROID) 50 MCG tablet Take 50 mcg by mouth daily.    . metoprolol succinate (TOPROL-XL) 50 MG 24 hr tablet Take 1 tablet by mouth in the morning and 1/2 tablet by mouth in the evening with or immediately following a meal. 135 tablet 2  . omeprazole (PRILOSEC) 40 MG capsule Take 40 mg by mouth daily.     . quinapril (ACCUPRIL) 20 MG tablet Take 40 mg by  mouth at bedtime.     . rosuvastatin (CRESTOR) 40 MG tablet Take 40 mg by mouth daily.    . sertraline (ZOLOFT) 100 MG tablet Take 100 mg by mouth. Take 50 mg in by mouth in am and 100 mg by mouth in the pm    . tiZANidine (ZANAFLEX) 4 MG tablet Take 4 mg by mouth daily.     No current facility-administered medications for this visit.     Allergies:   Patient has no known allergies.    Social History:  The patient  reports that she quit smoking about 34 years ago. Her smoking use included cigarettes. She has a 12.00 pack-year smoking history. She has never used smokeless tobacco. She reports that she does not drink alcohol or use drugs.   Family History:  The patient's family history includes Diabetes in her daughter; Heart Problems in her son; Heart disease in her brother and sister.    ROS:  Please see the history of present illness.    Otherwise, review of systems are positive for chest pain.   All other systems are reviewed and negative.    PHYSICAL EXAM: VS:  BP 130/90   Pulse 74   Ht 5\' 4"  (1.626 m)   Wt 129 lb (58.5 kg)   SpO2 95%   BMI 22.14 kg/m  , BMI Body mass index is 22.14 kg/m. GEN: Well nourished, well developed, in no acute distress  HEENT: normal  Neck: no JVD, carotid bruits, or masses Cardiac: RRR; no murmurs, rubs, or gallops,no edema ; 2+ left radial pulse Respiratory:  clear to auscultation bilaterally, normal work of breathing GI: soft, nontender, nondistended, + BS MS: no deformity or atrophy  Skin: warm and dry, no rash Neuro:  Strength and sensation are intact Psych: euthymic mood, full affect   EKG:   The ekg ordered today demonstrates NSR, PVC, diffuse inferior/lateral T wave inversions.persistent   Recent Labs: No results found for requested labs within last 8760 hours.   Lipid Panel    Component Value Date/Time   CHOL 239 (H) 02/01/2016 1351   TRIG 247 (H) 02/01/2016 1351   HDL 47 02/01/2016 1351   CHOLHDL 5.1 (H) 02/01/2016 1351   CHOLHDL 4 04/08/2014 1055   VLDL 28.4 04/08/2014 1055   LDLCALC 143 (H) 02/01/2016 1351   LDLDIRECT 90.8 01/08/2014 0828     Other studies Reviewed: Additional studies/ records that were reviewed today with results demonstrating: moderate left ICA stenosis, 40-59% in 2017.   ASSESSMENT AND PLAN:  1. CAD: Sx concerning for angina.  Risks and benefits of cath explained to the patient.  She is wiling to have the cath done.  SHe is on 2 antianginals with class 3 sx.  CABG x 3, 25 years ago. 2. HTN: The current medical regimen is effective;  continue present plan and medications. 3. Hyperlipidemia: Has been on High dose Crestor and Zetia.  No longer on Zetia due to insurance.  LDL 121 in 2018.  May need to consider PCSK9 inhibitor.  4. Carotid Disease: Doppler as noted above.    Current medicines are reviewed at length with the patient today.   The patient concerns regarding her medicines were addressed.  The following changes have been made:  No change  Labs/ tests ordered today include:  No orders of the defined types were placed in this encounter.   Recommend 150 minutes/week of aerobic exercise Low fat, low carb, high fiber diet recommended  Disposition:   FU  for cath   Signed, Lance Muss, MD  03/27/2017 3:06 PM    [facility] [address] [place], [place], [place]  [address] Phone: [phone]; Fax: [phone]

## 2017-03-28 LAB — PROTIME-INR
INR: 1 (ref 0.8–1.2)
Prothrombin Time: 10.4 s (ref 9.1–12.0)

## 2017-03-28 LAB — CBC
HEMOGLOBIN: 13.4 g/dL (ref 11.1–15.9)
Hematocrit: 39.6 % (ref 34.0–46.6)
MCH: 30.9 pg (ref 26.6–33.0)
MCHC: 33.8 g/dL (ref 31.5–35.7)
MCV: 92 fL (ref 79–97)
Platelets: 164 10*3/uL (ref 150–379)
RBC: 4.33 x10E6/uL (ref 3.77–5.28)
RDW: 13.5 % (ref 12.3–15.4)
WBC: 8.1 10*3/uL (ref 3.4–10.8)

## 2017-03-28 LAB — BASIC METABOLIC PANEL
BUN/Creatinine Ratio: 26 (ref 12–28)
BUN: 18 mg/dL (ref 8–27)
CHLORIDE: 100 mmol/L (ref 96–106)
CO2: 27 mmol/L (ref 20–29)
Calcium: 10 mg/dL (ref 8.7–10.3)
Creatinine, Ser: 0.69 mg/dL (ref 0.57–1.00)
GFR calc non Af Amer: 88 mL/min/{1.73_m2} (ref >59)
GFR, EST AFRICAN AMERICAN: 102 mL/min/{1.73_m2} (ref >59)
GLUCOSE: 91 mg/dL (ref 65–99)
POTASSIUM: 4.3 mmol/L (ref 3.5–5.2)
Sodium: 142 mmol/L (ref 134–144)

## 2017-04-02 ENCOUNTER — Telehealth: Payer: Self-pay | Admitting: *Deleted

## 2017-04-02 NOTE — Telephone Encounter (Addendum)
[facility] for: Tuesday April 2,2019 11 AM Arrival time and place: [facility] Main [facility] Tower at: 9 AM Nothing to eat or drink after midnight prior to cath. Verified allergies in Epic. Verified no diabetes medications.  Verified AM meds can be  taken pre-cath with sip of water including: ASA   Confirmed patient has responsible person to drive home post procedure and observe patient for 24 hours: yes

## 2017-04-03 ENCOUNTER — Ambulatory Visit (HOSPITAL_COMMUNITY)
Admission: RE | Disposition: A | Discharge status: Home / Self Care | Payer: Self-pay | Source: Ambulatory Visit | Attending: Interventional Cardiology

## 2017-04-03 ENCOUNTER — Ambulatory Visit (HOSPITAL_COMMUNITY)
Admission: RE | Admit: 2017-04-03 | Discharge: 2017-04-05 | Disposition: A | Discharge status: Home / Self Care | Payer: Medicare HMO | Source: Ambulatory Visit | Attending: Interventional Cardiology | Admitting: Interventional Cardiology

## 2017-04-03 ENCOUNTER — Encounter (HOSPITAL_COMMUNITY): Payer: Self-pay | Admitting: Interventional Cardiology

## 2017-04-03 ENCOUNTER — Ambulatory Visit (HOSPITAL_COMMUNITY): Payer: Medicare HMO

## 2017-04-03 ENCOUNTER — Other Ambulatory Visit: Payer: Self-pay

## 2017-04-03 DIAGNOSIS — I2582 Chronic total occlusion of coronary artery: Secondary | ICD-10-CM | POA: Diagnosis not present

## 2017-04-03 DIAGNOSIS — I25110 Atherosclerotic heart disease of native coronary artery with unstable angina pectoris: Secondary | ICD-10-CM | POA: Insufficient documentation

## 2017-04-03 DIAGNOSIS — L7632 Postprocedural hematoma of skin and subcutaneous tissue following other procedure: Secondary | ICD-10-CM | POA: Insufficient documentation

## 2017-04-03 DIAGNOSIS — I209 Angina pectoris, unspecified: Secondary | ICD-10-CM | POA: Diagnosis present

## 2017-04-03 DIAGNOSIS — F329 Major depressive disorder, single episode, unspecified: Secondary | ICD-10-CM | POA: Diagnosis not present

## 2017-04-03 DIAGNOSIS — F419 Anxiety disorder, unspecified: Secondary | ICD-10-CM | POA: Insufficient documentation

## 2017-04-03 DIAGNOSIS — Y840 Cardiac catheterization as the cause of abnormal reaction of the patient, or of later complication, without mention of misadventure at the time of the procedure: Secondary | ICD-10-CM | POA: Insufficient documentation

## 2017-04-03 DIAGNOSIS — Z87891 Personal history of nicotine dependence: Secondary | ICD-10-CM | POA: Diagnosis not present

## 2017-04-03 DIAGNOSIS — Z79890 Hormone replacement therapy (postmenopausal): Secondary | ICD-10-CM | POA: Insufficient documentation

## 2017-04-03 DIAGNOSIS — N3289 Other specified disorders of bladder: Secondary | ICD-10-CM | POA: Insufficient documentation

## 2017-04-03 DIAGNOSIS — I119 Hypertensive heart disease without heart failure: Secondary | ICD-10-CM | POA: Insufficient documentation

## 2017-04-03 DIAGNOSIS — I25710 Atherosclerosis of autologous vein coronary artery bypass graft(s) with unstable angina pectoris: Secondary | ICD-10-CM | POA: Insufficient documentation

## 2017-04-03 DIAGNOSIS — Z7982 Long term (current) use of aspirin: Secondary | ICD-10-CM | POA: Insufficient documentation

## 2017-04-03 DIAGNOSIS — I25719 Atherosclerosis of autologous vein coronary artery bypass graft(s) with unspecified angina pectoris: Secondary | ICD-10-CM | POA: Diagnosis not present

## 2017-04-03 DIAGNOSIS — Z79899 Other long term (current) drug therapy: Secondary | ICD-10-CM | POA: Diagnosis not present

## 2017-04-03 DIAGNOSIS — E039 Hypothyroidism, unspecified: Secondary | ICD-10-CM | POA: Insufficient documentation

## 2017-04-03 DIAGNOSIS — E785 Hyperlipidemia, unspecified: Secondary | ICD-10-CM | POA: Diagnosis not present

## 2017-04-03 DIAGNOSIS — K219 Gastro-esophageal reflux disease without esophagitis: Secondary | ICD-10-CM | POA: Insufficient documentation

## 2017-04-03 DIAGNOSIS — Z951 Presence of aortocoronary bypass graft: Secondary | ICD-10-CM | POA: Insufficient documentation

## 2017-04-03 DIAGNOSIS — R140 Abdominal distension (gaseous): Secondary | ICD-10-CM | POA: Diagnosis not present

## 2017-04-03 DIAGNOSIS — Z955 Presence of coronary angioplasty implant and graft: Secondary | ICD-10-CM

## 2017-04-03 HISTORY — PX: ULTRASOUND GUIDANCE FOR VASCULAR ACCESS: SHX6516

## 2017-04-03 HISTORY — PX: CORONARY STENT INTERVENTION: CATH118234

## 2017-04-03 HISTORY — PX: LEFT HEART CATH AND CORS/GRAFTS ANGIOGRAPHY: CATH118250

## 2017-04-03 LAB — GLUCOSE, CAPILLARY: GLUCOSE-CAPILLARY: 107 mg/dL — AB (ref 65–99)

## 2017-04-03 LAB — HEMOGLOBIN AND HEMATOCRIT, BLOOD
HCT: 34.4 % — ABNORMAL LOW (ref 36.0–46.0)
Hemoglobin: 11.4 g/dL — ABNORMAL LOW (ref 12.0–15.0)

## 2017-04-03 LAB — POCT ACTIVATED CLOTTING TIME: ACTIVATED CLOTTING TIME: 296 s

## 2017-04-03 SURGERY — LEFT HEART CATH AND CORS/GRAFTS ANGIOGRAPHY
Anesthesia: LOCAL

## 2017-04-03 MED ORDER — QUINAPRIL HCL 10 MG PO TABS
20.0000 mg | ORAL_TABLET | Freq: Two times a day (BID) | ORAL | Status: DC
  Administered 2017-04-04 – 2017-04-05 (×3): 20 mg via ORAL
  Filled 2017-04-03 (×4): qty 2

## 2017-04-03 MED ORDER — LABETALOL HCL 5 MG/ML IV SOLN: 10.0000 mg | INTRAVENOUS | Status: DC | PRN

## 2017-04-03 MED ORDER — SODIUM CHLORIDE 0.9 % IV SOLN
INTRAVENOUS | Status: DC | PRN
  Administered 2017-04-03: 1.75 mg/kg/h via INTRAVENOUS

## 2017-04-03 MED ORDER — CLOPIDOGREL BISULFATE 75 MG PO TABS
75.0000 mg | ORAL_TABLET | Freq: Every day | ORAL | Status: DC
  Administered 2017-04-04 – 2017-04-05 (×2): 75 mg via ORAL
  Filled 2017-04-03 (×2): qty 1

## 2017-04-03 MED ORDER — VERAPAMIL HCL 2.5 MG/ML IV SOLN
INTRAVENOUS | Status: DC | PRN
  Administered 2017-04-03 (×3): 200 ug via INTRACORONARY

## 2017-04-03 MED ORDER — LEVOTHYROXINE SODIUM 50 MCG PO TABS
50.0000 ug | ORAL_TABLET | Freq: Every day | ORAL | Status: DC
Start: 2017-04-04 — End: 2017-04-05
  Administered 2017-04-04 – 2017-04-05 (×2): 50 ug via ORAL
  Filled 2017-04-03 (×2): qty 1

## 2017-04-03 MED ORDER — ASPIRIN 81 MG PO CHEW
81.0000 mg | CHEWABLE_TABLET | Freq: Every day | ORAL | Status: DC
  Administered 2017-04-04 – 2017-04-05 (×2): 81 mg via ORAL
  Filled 2017-04-03 (×2): qty 1

## 2017-04-03 MED ORDER — IOHEXOL 350 MG/ML SOLN
INTRAVENOUS | Status: DC | PRN
  Administered 2017-04-03: 10 mL via INTRAVENOUS

## 2017-04-03 MED ORDER — AMLODIPINE BESYLATE 5 MG PO TABS
5.0000 mg | ORAL_TABLET | Freq: Every day | ORAL | Status: DC
Start: 2017-04-03 — End: 2017-04-05
  Administered 2017-04-04 – 2017-04-05 (×2): 5 mg via ORAL
  Filled 2017-04-03 (×3): qty 1

## 2017-04-03 MED ORDER — NITROGLYCERIN 0.4 MG SL SUBL: 0.4000 mg | SUBLINGUAL_TABLET | SUBLINGUAL | Status: DC | PRN

## 2017-04-03 MED ORDER — LIDOCAINE HCL (PF) 1 % IJ SOLN
INTRAMUSCULAR | Status: AC
  Filled 2017-04-03: qty 30

## 2017-04-03 MED ORDER — LIDOCAINE HCL (PF) 1 % IJ SOLN
INTRAMUSCULAR | Status: DC | PRN
  Administered 2017-04-03: 15 mL

## 2017-04-03 MED ORDER — SERTRALINE HCL 50 MG PO TABS
100.0000 mg | ORAL_TABLET | Freq: Every day | ORAL | Status: DC
  Administered 2017-04-04: 100 mg via ORAL
  Filled 2017-04-03: qty 2

## 2017-04-03 MED ORDER — ACETAMINOPHEN 325 MG PO TABS
650.0000 mg | ORAL_TABLET | ORAL | Status: DC | PRN
  Administered 2017-04-04: 650 mg via ORAL
  Filled 2017-04-03: qty 2

## 2017-04-03 MED ORDER — FENTANYL CITRATE (PF) 100 MCG/2ML IJ SOLN
INTRAMUSCULAR | Status: DC | PRN
  Administered 2017-04-03 (×2): 25 ug via INTRAVENOUS

## 2017-04-03 MED ORDER — MIDAZOLAM HCL 2 MG/2ML IJ SOLN
INTRAMUSCULAR | Status: AC
  Filled 2017-04-03: qty 2

## 2017-04-03 MED ORDER — ALUM & MAG HYDROXIDE-SIMETH 200-200-20 MG/5ML PO SUSP
30.0000 mL | Freq: Four times a day (QID) | ORAL | Status: DC | PRN
  Administered 2017-04-03: 22:00:00 30 mL via ORAL
  Filled 2017-04-03: qty 30

## 2017-04-03 MED ORDER — HEPARIN (PORCINE) IN NACL 2-0.9 UNIT/ML-% IJ SOLN
INTRAMUSCULAR | Status: AC
  Filled 2017-04-03: qty 1000

## 2017-04-03 MED ORDER — METOPROLOL SUCCINATE ER 25 MG PO TB24
50.0000 mg | ORAL_TABLET | Freq: Every day | ORAL | Status: DC
  Administered 2017-04-04: 50 mg via ORAL
  Filled 2017-04-03: qty 2

## 2017-04-03 MED ORDER — ALPRAZOLAM 0.5 MG PO TABS
1.0000 mg | ORAL_TABLET | Freq: Every day | ORAL | Status: DC
  Administered 2017-04-03 – 2017-04-04 (×2): 1 mg via ORAL
  Filled 2017-04-03 (×3): qty 2

## 2017-04-03 MED ORDER — IOPAMIDOL (ISOVUE-370) INJECTION 76%
INTRAVENOUS | Status: DC | PRN
  Administered 2017-04-03: 115 mL via INTRA_ARTERIAL

## 2017-04-03 MED ORDER — MIDAZOLAM HCL 2 MG/2ML IJ SOLN
INTRAMUSCULAR | Status: DC | PRN
  Administered 2017-04-03: 1 mg via INTRAVENOUS
  Administered 2017-04-03: 2 mg via INTRAVENOUS

## 2017-04-03 MED ORDER — ROSUVASTATIN CALCIUM 40 MG PO TABS
40.0000 mg | ORAL_TABLET | Freq: Every day | ORAL | Status: DC
  Administered 2017-04-04: 20:00:00 40 mg via ORAL
  Filled 2017-04-03: qty 1
  Filled 2017-04-03: qty 2
  Filled 2017-04-03 (×2): qty 1

## 2017-04-03 MED ORDER — SODIUM CHLORIDE 0.9 % IV SOLN: 250.0000 mL | INTRAVENOUS | Status: DC | PRN

## 2017-04-03 MED ORDER — CLOPIDOGREL BISULFATE 300 MG PO TABS
ORAL_TABLET | ORAL | Status: AC
  Filled 2017-04-03: qty 2

## 2017-04-03 MED ORDER — SERTRALINE HCL 50 MG PO TABS
50.0000 mg | ORAL_TABLET | Freq: Every day | ORAL | Status: DC
  Administered 2017-04-04 – 2017-04-05 (×2): 50 mg via ORAL
  Filled 2017-04-03 (×2): qty 1

## 2017-04-03 MED ORDER — SODIUM CHLORIDE 0.9% FLUSH: 3.0000 mL | INTRAVENOUS | Status: DC | PRN

## 2017-04-03 MED ORDER — ONDANSETRON HCL 4 MG/2ML IJ SOLN
4.0000 mg | Freq: Four times a day (QID) | INTRAMUSCULAR | Status: DC | PRN
  Administered 2017-04-03: 20:00:00 4 mg via INTRAVENOUS
  Filled 2017-04-03: qty 2

## 2017-04-03 MED ORDER — SODIUM CHLORIDE 0.9% FLUSH
3.0000 mL | Freq: Two times a day (BID) | INTRAVENOUS | Status: DC
  Administered 2017-04-04 – 2017-04-05 (×3): 3 mL via INTRAVENOUS

## 2017-04-03 MED ORDER — BIVALIRUDIN TRIFLUOROACETATE 250 MG IV SOLR
INTRAVENOUS | Status: AC
  Filled 2017-04-03: qty 250

## 2017-04-03 MED ORDER — NITROGLYCERIN 1 MG/10 ML FOR IR/CATH LAB
INTRA_ARTERIAL | Status: AC
  Filled 2017-04-03: qty 10

## 2017-04-03 MED ORDER — ASPIRIN 81 MG PO CHEW: 81.0000 mg | CHEWABLE_TABLET | ORAL | Status: DC

## 2017-04-03 MED ORDER — ENSURE ENLIVE PO LIQD
237.0000 mL | Freq: Two times a day (BID) | ORAL | Status: DC
  Administered 2017-04-04 – 2017-04-05 (×3): 237 mL via ORAL
  Filled 2017-04-03 (×6): qty 237

## 2017-04-03 MED ORDER — IOPAMIDOL (ISOVUE-370) INJECTION 76%
INTRAVENOUS | Status: AC
  Filled 2017-04-03: qty 125

## 2017-04-03 MED ORDER — SODIUM CHLORIDE 0.9 % WEIGHT BASED INFUSION
3.0000 mL/kg/h | INTRAVENOUS | Status: DC
  Administered 2017-04-03: 3 mL/kg/h via INTRAVENOUS

## 2017-04-03 MED ORDER — HEPARIN (PORCINE) IN NACL 2-0.9 UNIT/ML-% IJ SOLN
INTRAMUSCULAR | Status: DC | PRN
  Administered 2017-04-03 (×2): 500 mL

## 2017-04-03 MED ORDER — SODIUM CHLORIDE 0.9 % IV SOLN: INTRAVENOUS | Status: DC

## 2017-04-03 MED ORDER — SODIUM CHLORIDE 0.9% FLUSH: 3.0000 mL | Freq: Two times a day (BID) | INTRAVENOUS | Status: DC

## 2017-04-03 MED ORDER — HYDRALAZINE HCL 20 MG/ML IJ SOLN: 5.0000 mg | INTRAMUSCULAR | Status: DC | PRN

## 2017-04-03 MED ORDER — CLOPIDOGREL BISULFATE 300 MG PO TABS
ORAL_TABLET | ORAL | Status: DC | PRN
  Administered 2017-04-03: 600 mg via ORAL

## 2017-04-03 MED ORDER — FENTANYL CITRATE (PF) 100 MCG/2ML IJ SOLN
INTRAMUSCULAR | Status: AC
  Filled 2017-04-03: qty 2

## 2017-04-03 MED ORDER — METOPROLOL SUCCINATE ER 25 MG PO TB24
25.0000 mg | ORAL_TABLET | Freq: Every day | ORAL | Status: DC
  Administered 2017-04-04 – 2017-04-05 (×2): 25 mg via ORAL
  Filled 2017-04-03 (×3): qty 1

## 2017-04-03 MED ORDER — SODIUM CHLORIDE 0.9 % WEIGHT BASED INFUSION: 1.0000 mL/kg/h | INTRAVENOUS | Status: DC

## 2017-04-03 MED ORDER — BIVALIRUDIN BOLUS VIA INFUSION - CUPID
INTRAVENOUS | Status: DC | PRN
  Administered 2017-04-03: 42.525 mg via INTRAVENOUS

## 2017-04-03 MED ORDER — VERAPAMIL HCL 2.5 MG/ML IV SOLN
INTRAVENOUS | Status: AC
  Filled 2017-04-03: qty 2

## 2017-04-03 SURGICAL SUPPLY — 26 items
BALLN EMERGE MR 2.25X12 (BALLOONS) ×3
BALLN SAPPHIRE 2.0X15 (BALLOONS) ×3
BALLOON EMERGE MR 2.25X12 (BALLOONS) ×2 IMPLANT
BALLOON SAPPHIRE 2.0X15 (BALLOONS) ×2 IMPLANT
CATH INFINITI 5FR MULTPACK ANG (CATHETERS) ×3 IMPLANT
CATH LAUNCHER 6FR JR4 (CATHETERS) ×3 IMPLANT
COVER PRB 48X5XTLSCP FOLD TPE (BAG) ×2 IMPLANT
COVER PROBE 5X48 (BAG) ×1
GUIDEWIRE INQWIRE 1.5J.035X260 (WIRE) IMPLANT
INQWIRE 1.5J .035X260CM (WIRE)
KIT ENCORE 26 ADVANTAGE (KITS) ×3 IMPLANT
KIT HEART LEFT (KITS) ×3 IMPLANT
KIT HEMO VALVE WATCHDOG (MISCELLANEOUS) ×3 IMPLANT
NEEDLE PERC 21GX4CM (NEEDLE) IMPLANT
PACK CARDIAC CATHETERIZATION (CUSTOM PROCEDURE TRAY) ×3 IMPLANT
SHEATH AVANTI 11CM 5FR (SHEATH) ×3 IMPLANT
SHEATH AVANTI 11CM 6FR (SHEATH) ×3 IMPLANT
SHEATH RAIN RADIAL 21G 6FR (SHEATH) IMPLANT
STENT SYNERGY DES 2.25X12 (Permanent Stent) ×3 IMPLANT
TRANSDUCER W/STOPCOCK (MISCELLANEOUS) ×3 IMPLANT
TUBING CIL FLEX 10 FLL-RA (TUBING) ×3 IMPLANT
WIRE ASAHI PROWATER 180CM (WIRE) ×3 IMPLANT
WIRE EMERALD 3MM-J .035X150CM (WIRE) ×3 IMPLANT
WIRE EMERALD 3MM-J .035X260CM (WIRE) ×3 IMPLANT
WIRE HI TORQ BMW 190CM (WIRE) ×3 IMPLANT
WIRE HI TORQ VERSACORE-J 145CM (WIRE) ×3 IMPLANT

## 2017-04-03 NOTE — Progress Notes (Addendum)
    I was called to the pt room for groin hematoma and pt loss of consciousness, likely vasovagal reaction. Pt was alert and talking when I arrived. Right groin is soft without bruit but area above, near right pelvic bone, is larger than the left, soft. Sensation and circulation intact to right leg. Continued to apply pressure to right groin. BP drifted down to 90, Fluid bolus given, 250 ml X2. HR maintaining in the 80's. Stat H&H drawn. CT abd/pelvis ordered stat.   Pt is now stable with BP 130's. Pt going to CT with nurse.   Reported off to the night fellow.   Berton BonJanine Bernabe Dorce, AGNP-C [facility] 04/03/2017  8:34 PM Pager: [phone]

## 2017-04-03 NOTE — Interval H&P Note (Signed)
Cath Lab Visit (complete for each Cath Lab visit)  Clinical Evaluation Leading to the Procedure:   ACS: No.  Non-ACS:    Anginal Classification: CCS III  Anti-ischemic medical therapy: Minimal Therapy (1 class of medications)  Non-Invasive Test Results: No non-invasive testing performed  Prior CABG: Previous CABG      History and Physical Interval Note:  04/03/2017 12:57 PM  Dawn Boyer  has presented today for surgery, with the diagnosis of cad/angina  The various methods of treatment have been discussed with the patient and family. After consideration of risks, benefits and other options for treatment, the patient has consented to  Procedure(s): LEFT HEART CATH AND CORS/GRAFTS ANGIOGRAPHY (N/A) as a surgical intervention .  The patient's history has been reviewed, patient examined, no change in status, stable for surgery.  I have reviewed the patient's chart and labs.  Questions were answered to the patient's satisfaction.     [name]

## 2017-04-03 NOTE — Progress Notes (Addendum)
Site area: right groin  Site Prior to Removal:  Level 0  Pressure Applied For 25 MINUTES    Minutes Beginning at 1835  Manual:   Yes.    Patient Status During Pull:  Patient denied any pain except where pressure held, pubic area appeared swollen and larger that left side and area above site appeared swollen but soft with palpation. Donna ChristenJim Eldrett called to assess and concluded it was tissue displacement. Continued to hold pressure total of 25 minutes. During this time patient denied any pain in pubic area abdomen, hip or flank pain. Stated it only hurt where I was holding pressure. No bruising noted. Blood pressure stable, higher at times possible due to pain from holding pressure.   Post Pull Groin Site:  Level 0  Post Pull Instructions Given:  Yes.    Post Pull Pulses Present:  Yes.    Dressing Applied:  Yes.    Comments: Bedrest started at [id number].

## 2017-04-03 NOTE — Progress Notes (Signed)
1940 Into round on patient, patient sitting up at 30 degrees and eating. Patient stated she feels nauseated and severe pain in right lower abdomen that just started. Color pale, patient feel back in bed, loss consciousness and began agonal breathing, code called. Patient maintained heart rate in SR and blood pressure. Awoken in minutes after shaken. Noted hematoma right lower abdomen and pelvic area. No hematoma noted at cath site. Pressure held on hematomas. Berton BonJanine Hammond NP called and arrived to see patient. SBP 90's, bolus started of  NS per orders. Pressure held at site by NP and orders given for stat H&H and CT scan.

## 2017-04-04 DIAGNOSIS — I25110 Atherosclerotic heart disease of native coronary artery with unstable angina pectoris: Secondary | ICD-10-CM | POA: Diagnosis not present

## 2017-04-04 DIAGNOSIS — K219 Gastro-esophageal reflux disease without esophagitis: Secondary | ICD-10-CM | POA: Diagnosis not present

## 2017-04-04 DIAGNOSIS — E782 Mixed hyperlipidemia: Secondary | ICD-10-CM

## 2017-04-04 DIAGNOSIS — I2582 Chronic total occlusion of coronary artery: Secondary | ICD-10-CM | POA: Diagnosis not present

## 2017-04-04 DIAGNOSIS — E039 Hypothyroidism, unspecified: Secondary | ICD-10-CM | POA: Diagnosis not present

## 2017-04-04 DIAGNOSIS — I25710 Atherosclerosis of autologous vein coronary artery bypass graft(s) with unstable angina pectoris: Secondary | ICD-10-CM | POA: Diagnosis not present

## 2017-04-04 DIAGNOSIS — I739 Peripheral vascular disease, unspecified: Secondary | ICD-10-CM

## 2017-04-04 DIAGNOSIS — I209 Angina pectoris, unspecified: Secondary | ICD-10-CM | POA: Diagnosis not present

## 2017-04-04 DIAGNOSIS — L7632 Postprocedural hematoma of skin and subcutaneous tissue following other procedure: Secondary | ICD-10-CM | POA: Diagnosis not present

## 2017-04-04 DIAGNOSIS — E785 Hyperlipidemia, unspecified: Secondary | ICD-10-CM | POA: Diagnosis not present

## 2017-04-04 DIAGNOSIS — I119 Hypertensive heart disease without heart failure: Secondary | ICD-10-CM | POA: Diagnosis not present

## 2017-04-04 DIAGNOSIS — N3289 Other specified disorders of bladder: Secondary | ICD-10-CM | POA: Diagnosis not present

## 2017-04-04 LAB — BASIC METABOLIC PANEL
Anion gap: 8 (ref 5–15)
BUN: 11 mg/dL (ref 6–20)
CHLORIDE: 110 mmol/L (ref 101–111)
CO2: 21 mmol/L — ABNORMAL LOW (ref 22–32)
CREATININE: 0.7 mg/dL (ref 0.44–1.00)
Calcium: 8.3 mg/dL — ABNORMAL LOW (ref 8.9–10.3)
GFR calc Af Amer: >60 mL/min (ref >60)
GFR calc non Af Amer: >60 mL/min (ref >60)
GLUCOSE: 97 mg/dL (ref 65–99)
POTASSIUM: 4.5 mmol/L (ref 3.5–5.1)
Sodium: 139 mmol/L (ref 135–145)

## 2017-04-04 LAB — CBC
HEMATOCRIT: 28 % — AB (ref 36.0–46.0)
Hemoglobin: 9.2 g/dL — ABNORMAL LOW (ref 12.0–15.0)
MCH: 29.6 pg (ref 26.0–34.0)
MCHC: 32.9 g/dL (ref 30.0–36.0)
MCV: 90 fL (ref 78.0–100.0)
PLATELETS: 108 10*3/uL — AB (ref 150–400)
RBC: 3.11 MIL/uL — ABNORMAL LOW (ref 3.87–5.11)
RDW: 12.6 % (ref 11.5–15.5)
WBC: 8.7 10*3/uL (ref 4.0–10.5)

## 2017-04-04 LAB — HEMOGLOBIN AND HEMATOCRIT, BLOOD
HCT: 26.7 % — ABNORMAL LOW (ref 36.0–46.0)
Hemoglobin: 9.1 g/dL — ABNORMAL LOW (ref 12.0–15.0)

## 2017-04-04 MED ORDER — SODIUM CHLORIDE 0.9 % IV BOLUS
250.0000 mL | Freq: Once | INTRAVENOUS | Status: AC
  Administered 2017-04-03: 250 mL via INTRAVENOUS

## 2017-04-04 MED ORDER — SODIUM CHLORIDE 0.9 % IV BOLUS
250.0000 mL | Freq: Once | INTRAVENOUS | Status: DC
  Administered 2017-04-03: 22:00:00 250 mL via INTRAVENOUS

## 2017-04-04 MED ORDER — DOCUSATE SODIUM 100 MG PO CAPS
100.0000 mg | ORAL_CAPSULE | Freq: Two times a day (BID) | ORAL | Status: DC | PRN
  Administered 2017-04-04: 100 mg via ORAL
  Filled 2017-04-04: qty 1

## 2017-04-04 MED ORDER — ANGIOPLASTY BOOK
Freq: Once | Status: DC
Start: 2017-04-04 — End: 2017-04-05
  Filled 2017-04-04: qty 1

## 2017-04-04 MED ORDER — OXYCODONE HCL 5 MG PO TABS
5.0000 mg | ORAL_TABLET | ORAL | Status: DC | PRN
  Administered 2017-04-04: 08:00:00 5 mg via ORAL
  Filled 2017-04-04: qty 1

## 2017-04-04 MED ORDER — SODIUM CHLORIDE 0.9 % IV BOLUS
500.0000 mL | Freq: Once | INTRAVENOUS | Status: AC
  Administered 2017-04-03: 500 mL via INTRAVENOUS

## 2017-04-04 MED ORDER — TIZANIDINE HCL 4 MG PO TABS
4.0000 mg | ORAL_TABLET | Freq: Every day | ORAL | Status: DC
  Administered 2017-04-04: 23:00:00 4 mg via ORAL
  Filled 2017-04-04: qty 1

## 2017-04-04 NOTE — Progress Notes (Addendum)
Progress Note  Patient Name: Dawn Boyer Date of Encounter: 04/04/2017  Primary Cardiologist: Lance Muss, MD   Subjective   The patient had an episode of syncope last evening with groin pain.  Pressure was held and she stabilized.  This morning her groin is sore and she is still on bedrest.  She is denying any chest discomfort, shortness of breath, lightheadedness.  Inpatient Medications    Scheduled Meds: . ALPRAZolam  1 mg Oral Q2000  . amLODipine  5 mg Oral Daily  . aspirin  81 mg Oral Daily  . clopidogrel  75 mg Oral Q breakfast  . feeding supplement (ENSURE ENLIVE)  237 mL Oral BID BM  . levothyroxine  50 mcg Oral QAC breakfast  . metoprolol succinate  25 mg Oral QPC breakfast  . metoprolol succinate  50 mg Oral QPC supper  . quinapril  20 mg Oral BID  . rosuvastatin  40 mg Oral q1800  . sertraline  100 mg Oral QHS  . sertraline  50 mg Oral Daily  . sodium chloride flush  3 mL Intravenous Q12H   Continuous Infusions: . sodium chloride     PRN Meds: sodium chloride, acetaminophen, alum & mag hydroxide-simeth, docusate sodium, nitroGLYCERIN, ondansetron (ZOFRAN) IV, oxyCODONE, sodium chloride flush   Vital Signs    Vitals:   04/04/17 0330 04/04/17 0400 04/04/17 0630 04/04/17 0700  BP: (!) 111/54 (!) 122/49 (!) 116/45 (!) 109/51  Pulse: 78   81  Resp: 19 17 (!) 21 15  Temp: 98.9 F (37.2 C)   98.3 F (36.8 C)  TempSrc: Oral   Oral  SpO2: 93% 97% 97% 99%  Weight: 123 lb 7.3 oz (56 kg)     Height:        Intake/Output Summary (Last 24 hours) at 04/04/2017 0848 Last data filed at 04/04/2017 0700 Gross per 24 hour  Intake 420.75 ml  Output 1000 ml  Net -579.25 ml   Filed Weights   04/03/17 0841 04/04/17 0330  Weight: 125 lb (56.7 kg) 123 lb 7.3 oz (56 kg)    Telemetry    Normal sinus rhythm.  Had a brief run of bigeminy this morning.- Personally Reviewed  ECG    Normal sinus rhythm, 82 bpm, diffuse T wave inversions similar to previous-  Personally Reviewed  Physical Exam   GEN: No acute distress.   Neck: No JVD Cardiac: RRR, 2/6 apical murmur, no rubs, or gallops.  Respiratory: Clear to auscultation bilaterally. GI: Soft, slightly protruding right lower quadrant, soft, tender MS: No edema; No deformity. Neuro:  Nonfocal  Psych: Normal affect   Right groin cath site soft without hematoma, no bruit  Labs    Chemistry Recent Labs  Lab 04/04/17 0334  NA 139  K 4.5  CL 110  CO2 21*  GLUCOSE 97  BUN 11  CREATININE 0.70  CALCIUM 8.3*  GFRNONAA >60  GFRAA >60  ANIONGAP 8     Hematology Recent Labs  Lab 04/03/17 2012 04/04/17 0334  WBC  --  8.7  RBC  --  3.11*  HGB 11.4* 9.2*  HCT 34.4* 28.0*  MCV  --  90.0  MCH  --  29.6  MCHC  --  32.9  RDW  --  12.6  PLT  --  108*    Cardiac EnzymesNo results for input(s): TROPONINI in the last 168 hours. No results for input(s): TROPIPOC in the last 168 hours.   BNPNo results for input(s): BNP, PROBNP in  the last 168 hours.   DDimer No results for input(s): DDIMER in the last 168 hours.   Radiology    Ct Abdomen Pelvis Wo Contrast  Result Date: 04/03/2017 CLINICAL DATA:  Recent cardiac catheterization with groin hematoma and abdominal distension EXAM: CT ABDOMEN AND PELVIS WITHOUT CONTRAST TECHNIQUE: Multidetector CT imaging of the abdomen and pelvis was performed following the standard protocol without IV contrast. COMPARISON:  10/12/2014 FINDINGS: Lower chest: Chronic fibrotic changes are noted in the lung bases bilaterally. No acute infiltrate is seen. Hepatobiliary: No focal liver abnormality is seen. Status post cholecystectomy. No biliary dilatation. Pancreas: Unremarkable. No pancreatic ductal dilatation or surrounding inflammatory changes. Spleen: Normal in size without focal abnormality. Adrenals/Urinary Tract: Adrenal glands are within normal limits. Normal enhancement is noted within the kidneys. Right renal cyst is seen similar to that seen on prior  exam. Previously seen calculi are not well appreciated due to contrast within the collecting system. No obstructive changes are seen. Bladder is partially distended. Stomach/Bowel: No obstructive or inflammatory changes are noted. The appendix is well visualized and within normal limits. Vascular/Lymphatic: Diffuse vascular calcifications are identified without aneurysmal dilatation. In the right groin there are changes consistent with the recent cardiac catheterization with hemorrhage extending into the subcutaneous tissues as well as superiorly along the psoas muscle into the retroperitoneum. No findings to suggest active hemorrhage are identified at this time. Extension of the hemorrhage into the mons pubis on the right is noted as well. Reproductive: Uterus and bilateral adnexa are unremarkable. Other: No abdominal wall hernia or abnormality. No abdominopelvic ascites. Musculoskeletal: Degenerative changes of the lumbar spine are noted. There are changes consistent with compression deformity at T12 and L2. The L2 compression deformity is new from the prior exam. IMPRESSION: Changes consistent with acute post catheterization hemorrhage extending inferiorly into the region of the mons pubis on the right and superiorly along the subcutaneous tissues extrinsic to the abdominal wall as well as along the right psoas muscle into the retroperitoneum. No findings to suggest active extravasation are noted at this time. L2 compression deformity which is new from the prior study. Electronically Signed   By: Alcide Clever M.D.   On: 04/03/2017 21:04    Cardiac Studies   CORONARY STENT INTERVENTION  LEFT HEART CATH AND CORS/GRAFTS ANGIOGRAPHY  04/03/17  Conclusion    Ost 1st Diag lesion is 100% stenosed. SVG to diagonal has a Mid Graft lesion is 95% stenosed.  Ost Cx to Prox Cx lesion is 100% stenosed. SVG to Left PDA is patent.  Ost LM to Mid LM lesion is 100% stenosed. LIMA to LAD.  The proximal subclavian has  a severe calcific stenosis, 80%, with significant decrease in left arm BP compared to central aortic pressure, 30 mm Hg.  There was difficulty navigating from the right groin to the aorta due to calcific lesion at the aortoiliac bifurcation. There was a 30 mm Hg pressure gradient from the right groin to the aorta.  SVG to diagonal has a Mid Graft lesion is 95% stenosed. A drug-eluting stent was successfully placed using a STENT SYNERGY DES 2.25X12.  Post intervention, there is a 0% residual stenosis.  The left ventricular systolic function is normal.  LV end diastolic pressure is normal.  The left ventricular ejection fraction is 55-65% by visual estimate.   Continue dual antiplatelet therapy for at least 6 months.  Will discuss with PV MD regarding proximal subclavian stenosis, as it does compromise the LIMA.  Continue Angiomax until  current bag runs out.  We will have to see if angina is improved after PCI of the SVG to diagonal.  If not, would then have to consider intervention to the proximal subclavian to improve flow to the LIMA graft.   Diagnostic Diagram       Post-Intervention Diagram          Patient Profile     72 y.o. female who had CABG in 1994, hypothyroidism, hypertensive heart disease, hypertension, hyperlipidemia, GERD, mild left carotid artery stenosis, arthritis.  Develop symptoms concerning for angina and arrange for cardiac cath.  Assessment & Plan    Unstable angina -History of CAD status post CABG in 1994.  Presented to the office with anginal type symptoms.  Cardiac catheterization done yesterday revealed 95% mid graft lesion of the SVG to diagonal.  SVG to left PDA patent.  LIMA to LAD patent.  SVG to diagonal had mid graft lesion of 95% and a drug-eluting stent was placed.  Her LV function was normal.  Proximal subclavian with severe calcific stenosis, 80% with significant decrease in left arm BP compared to central aortic pressure.  Will instruct  staff not to use left arm for blood pressure. -Patient developed symptoms of groin pain with syncope.  There was no significant groin hematoma visible however she had a right lower quadrant soft distention.  Her blood pressure dropped and she was given IV fluids with improvement.  CT showed changes consistent with acute postcatheterization hemorrhage into the lower abdominal subcutaneous tissue as well as into the retroperitoneum.  No findings to suggest active extravasation.  -Hemoglobin decreased from 11.4 last night to 9.2 this morning.  We will continue to monitor. -The patient was monitored overnight and given IV fluids for hypotension once more.  This morning she is stable with good blood pressure.  -We will continue to monitor today and keep her on bedrest for this morning and get her up this afternoon to move around and see how she does.  Hypertension: The current medical regimen is effective;  continue present plan and medications.  Hyperlipidemia: Has been on High dose Crestor and Zetia.  No longer on Zetia due to insurance.  LDL 121 in 2018.  May need to consider PCSK9 inhibitor.    For questions or updates, please contact [facility] Please consult [email or web address] for contact info under Cardiology/STEMI.      Signed, Berton BonJanine Hammond, NP  04/04/2017, 8:48 AM    I have examined the patient and reviewed assessment and plan and discussed with patient.  Agree with above as stated.  Patient with groin pain at the insertion site.  THere was swelling above the insertion site in the right lower quadrant.  Hemoglobin has dropped.   Watch today.  Can be more mobile later today.  Avoid straining.   No angina at this time.  If she has more angina, would have to consider intervention the the right subclavian.  D/w Dr. Kirke CorinArida who felt that covered stent may be an option.  Of note, the patient has claudication as well and there was difficulty getting through the right common iliac. I suspect she ahd a  calcified femoral artery that was difficult to compress and therefore more prone to bleeding.    Long term, will need consideration for more aggressive lipid lowering therapy.    D/w Dr. Rennis GoldenHilty.  He will follow tomorrow.  Hopefully, she will be a candidate for sdischarge if her groin remains stable and her Hbg is stable.  Larae Grooms

## 2017-04-04 NOTE — Progress Notes (Signed)
CARDIAC REHAB PHASE I   PRE:  Rate/Rhythm: 89 SR    BP: sitting 134/46    SaO2:   MODE:  Ambulation: stood from recliner   POST:  Rate/Rhythm: 90 SR    BP: sitting 118/47 (1 min after sitting)     SaO2:   Pt in recliner but looking uncomfortable. Had been up for 30 min. Had her stand to dress recliner and reposition. Pt dizzy and shaky with standing. Back to recliner, BP lower. Discussed with pt, husband, and daughter stent, Plavix, diet, and CRPII. Will refer to [facility] CRPII. They are nervous and have many questions. Will f/u in am for ambulation. [id number]   Harriet MassonRandi Kristan Shalie Schremp CES, ACSM 04/04/2017 2:23 PM

## 2017-04-04 NOTE — Progress Notes (Signed)
Pt's B/P still low in the low 90's,Dr.Fudim made aware ordered to give another 500 cc bolus of NS .

## 2017-04-04 NOTE — Progress Notes (Signed)
Back to the room from CTscan  ;MD on call was called & notified him of the H&H results .& also relayed the results of CT scan.Ordered to keep monitoring pt.

## 2017-04-04 NOTE — Progress Notes (Signed)
Took pt.down to Ct scan via bed .

## 2017-04-05 DIAGNOSIS — I2582 Chronic total occlusion of coronary artery: Secondary | ICD-10-CM | POA: Diagnosis not present

## 2017-04-05 DIAGNOSIS — D62 Acute posthemorrhagic anemia: Secondary | ICD-10-CM | POA: Diagnosis not present

## 2017-04-05 DIAGNOSIS — R58 Hemorrhage, not elsewhere classified: Secondary | ICD-10-CM | POA: Diagnosis not present

## 2017-04-05 DIAGNOSIS — N3289 Other specified disorders of bladder: Secondary | ICD-10-CM | POA: Diagnosis not present

## 2017-04-05 DIAGNOSIS — I209 Angina pectoris, unspecified: Secondary | ICD-10-CM | POA: Diagnosis not present

## 2017-04-05 DIAGNOSIS — I25710 Atherosclerosis of autologous vein coronary artery bypass graft(s) with unstable angina pectoris: Secondary | ICD-10-CM | POA: Diagnosis not present

## 2017-04-05 DIAGNOSIS — K219 Gastro-esophageal reflux disease without esophagitis: Secondary | ICD-10-CM | POA: Diagnosis not present

## 2017-04-05 DIAGNOSIS — E039 Hypothyroidism, unspecified: Secondary | ICD-10-CM | POA: Diagnosis not present

## 2017-04-05 DIAGNOSIS — I119 Hypertensive heart disease without heart failure: Secondary | ICD-10-CM | POA: Diagnosis not present

## 2017-04-05 DIAGNOSIS — E785 Hyperlipidemia, unspecified: Secondary | ICD-10-CM | POA: Diagnosis not present

## 2017-04-05 DIAGNOSIS — L7632 Postprocedural hematoma of skin and subcutaneous tissue following other procedure: Secondary | ICD-10-CM | POA: Diagnosis not present

## 2017-04-05 DIAGNOSIS — I25110 Atherosclerotic heart disease of native coronary artery with unstable angina pectoris: Secondary | ICD-10-CM | POA: Diagnosis not present

## 2017-04-05 LAB — HEMOGLOBIN AND HEMATOCRIT, BLOOD
HEMATOCRIT: 26.4 % — AB (ref 36.0–46.0)
Hemoglobin: 8.9 g/dL — ABNORMAL LOW (ref 12.0–15.0)

## 2017-04-05 MED ORDER — METOPROLOL SUCCINATE ER 50 MG PO TB24: 50.0000 mg | ORAL_TABLET | Freq: Every day | ORAL | Status: DC

## 2017-04-05 MED ORDER — METOPROLOL SUCCINATE ER 25 MG PO TB24
25.0000 mg | ORAL_TABLET | Freq: Every day | ORAL | Status: DC
Start: 2017-04-05 — End: 2017-04-18

## 2017-04-05 MED ORDER — ASPIRIN 81 MG PO CHEW: 81.0000 mg | CHEWABLE_TABLET | Freq: Every day | ORAL | Status: DC

## 2017-04-05 MED ORDER — CLOPIDOGREL BISULFATE 75 MG PO TABS: 75.0000 mg | ORAL_TABLET | Freq: Every day | ORAL | 11 refills | Status: DC

## 2017-04-05 MED ORDER — ROSUVASTATIN CALCIUM 40 MG PO TABS: 40.0000 mg | ORAL_TABLET | Freq: Every day | ORAL | Status: DC

## 2017-04-05 MED ORDER — PANTOPRAZOLE SODIUM 40 MG PO TBEC: 40.0000 mg | DELAYED_RELEASE_TABLET | Freq: Every day | ORAL | 3 refills | Status: DC

## 2017-04-05 MED FILL — Heparin Sodium (Porcine) 2 Unit/ML in Sodium Chloride 0.9%: INTRAMUSCULAR | Qty: 1000 | Status: AC

## 2017-04-05 NOTE — Progress Notes (Signed)
Progress Note  Patient Name: Dawn Boyer Date of Encounter: 04/05/2017  Primary Cardiologist: Lance Muss, MD   Subjective   Doing better today. No chest pain. No dyspnea. Groin less tender.   Inpatient Medications    Scheduled Meds: . ALPRAZolam  1 mg Oral Q2000  . amLODipine  5 mg Oral Daily  . angioplasty book   Does not apply Once  . aspirin  81 mg Oral Daily  . clopidogrel  75 mg Oral Q breakfast  . feeding supplement (ENSURE ENLIVE)  237 mL Oral BID BM  . levothyroxine  50 mcg Oral QAC breakfast  . metoprolol succinate  25 mg Oral QPC breakfast  . metoprolol succinate  50 mg Oral QPC supper  . quinapril  20 mg Oral BID  . rosuvastatin  40 mg Oral q1800  . sertraline  100 mg Oral QHS  . sertraline  50 mg Oral Daily  . sodium chloride flush  3 mL Intravenous Q12H  . tiZANidine  4 mg Oral QHS   Continuous Infusions: . sodium chloride     PRN Meds: sodium chloride, acetaminophen, alum & mag hydroxide-simeth, docusate sodium, nitroGLYCERIN, ondansetron (ZOFRAN) IV, oxyCODONE, sodium chloride flush   Vital Signs    Vitals:   04/04/17 1500 04/04/17 1922 04/04/17 2000 04/05/17 0400  BP: 105/74 (!) 151/80 (!) 175/59 (!) 118/49  Pulse: 77 86  62  Resp: 17 (!) 23 (!) 22 20  Temp: (!) 97.5 F (36.4 C) 98.4 F (36.9 C)  98.1 F (36.7 C)  TempSrc: Oral Oral  Oral  SpO2: 100% 100% 99% 98%  Weight:    124 lb 9 oz (56.5 kg)  Height:        Intake/Output Summary (Last 24 hours) at 04/05/2017 0747 Last data filed at 04/04/2017 2156 Gross per 24 hour  Intake 240 ml  Output 150 ml  Net 90 ml   Filed Weights   04/03/17 0841 04/04/17 0330 04/05/17 0400  Weight: 125 lb (56.7 kg) 123 lb 7.3 oz (56 kg) 124 lb 9 oz (56.5 kg)    Telemetry    Currently NSR in the 70s with occasional PVCs. Bigeminy noted overnight - Personally Reviewed  ECG    NSR - Personally Reviewed  Physical Exam   GEN: No acute distress.   Neck: No JVD Cardiac: RRR, soft murmur at LUSB  vs Left subclavian stenosis bruit, no rubs, or gallops.  Respiratory: bibasilar crackles that improve slightly after cough, otherwise CTA GI: Soft, nontender, non-distended  MS: No edema; No deformity. Neuro:  Nonfocal  Psych: Normal affect   Labs    Chemistry Recent Labs  Lab 04/04/17 0334  NA 139  K 4.5  CL 110  CO2 21*  GLUCOSE 97  BUN 11  CREATININE 0.70  CALCIUM 8.3*  GFRNONAA >60  GFRAA >60  ANIONGAP 8     Hematology Recent Labs  Lab 04/03/17 2012 04/04/17 0334 04/04/17 1528  WBC  --  8.7  --   RBC  --  3.11*  --   HGB 11.4* 9.2* 9.1*  HCT 34.4* 28.0* 26.7*  MCV  --  90.0  --   MCH  --  29.6  --   MCHC  --  32.9  --   RDW  --  12.6  --   PLT  --  108*  --     Cardiac EnzymesNo results for input(s): TROPONINI in the last 168 hours. No results for input(s): TROPIPOC in the last  168 hours.   BNPNo results for input(s): BNP, PROBNP in the last 168 hours.   DDimer No results for input(s): DDIMER in the last 168 hours.   Radiology    Ct Abdomen Pelvis Wo Contrast  Result Date: 04/03/2017 CLINICAL DATA:  Recent cardiac catheterization with groin hematoma and abdominal distension EXAM: CT ABDOMEN AND PELVIS WITHOUT CONTRAST TECHNIQUE: Multidetector CT imaging of the abdomen and pelvis was performed following the standard protocol without IV contrast. COMPARISON:  10/12/2014 FINDINGS: Lower chest: Chronic fibrotic changes are noted in the lung bases bilaterally. No acute infiltrate is seen. Hepatobiliary: No focal liver abnormality is seen. Status post cholecystectomy. No biliary dilatation. Pancreas: Unremarkable. No pancreatic ductal dilatation or surrounding inflammatory changes. Spleen: Normal in size without focal abnormality. Adrenals/Urinary Tract: Adrenal glands are within normal limits. Normal enhancement is noted within the kidneys. Right renal cyst is seen similar to that seen on prior exam. Previously seen calculi are not well appreciated due to contrast  within the collecting system. No obstructive changes are seen. Bladder is partially distended. Stomach/Bowel: No obstructive or inflammatory changes are noted. The appendix is well visualized and within normal limits. Vascular/Lymphatic: Diffuse vascular calcifications are identified without aneurysmal dilatation. In the right groin there are changes consistent with the recent cardiac catheterization with hemorrhage extending into the subcutaneous tissues as well as superiorly along the psoas muscle into the retroperitoneum. No findings to suggest active hemorrhage are identified at this time. Extension of the hemorrhage into the mons pubis on the right is noted as well. Reproductive: Uterus and bilateral adnexa are unremarkable. Other: No abdominal wall hernia or abnormality. No abdominopelvic ascites. Musculoskeletal: Degenerative changes of the lumbar spine are noted. There are changes consistent with compression deformity at T12 and L2. The L2 compression deformity is new from the prior exam. IMPRESSION: Changes consistent with acute post catheterization hemorrhage extending inferiorly into the region of the mons pubis on the right and superiorly along the subcutaneous tissues extrinsic to the abdominal wall as well as along the right psoas muscle into the retroperitoneum. No findings to suggest active extravasation are noted at this time. L2 compression deformity which is new from the prior study. Electronically Signed   By: Alcide CleverMark  Lukens M.D.   On: 04/03/2017 21:04    Cardiac Studies   LHC 04/03/17 Procedures   CORONARY STENT INTERVENTION  LEFT HEART CATH AND CORS/GRAFTS ANGIOGRAPHY  Conclusion     Ost 1st Diag lesion is 100% stenosed. SVG to diagonal has a Mid Graft lesion is 95% stenosed.  Ost Cx to Prox Cx lesion is 100% stenosed. SVG to Left PDA is patent.  Ost LM to Mid LM lesion is 100% stenosed. LIMA to LAD.  The proximal subclavian has a severe calcific stenosis, 80%, with significant  decrease in left arm BP compared to central aortic pressure, 30 mm Hg.  There was difficulty navigating from the right groin to the aorta due to calcific lesion at the aortoiliac bifurcation. There was a 30 mm Hg pressure gradient from the right groin to the aorta.  SVG to diagonal has a Mid Graft lesion is 95% stenosed. A drug-eluting stent was successfully placed using a STENT SYNERGY DES 2.25X12.  Post intervention, there is a 0% residual stenosis.  The left ventricular systolic function is normal.  LV end diastolic pressure is normal.  The left ventricular ejection fraction is 55-65% by visual estimate.   Continue dual antiplatelet therapy for at least 6 months.  Will discuss with PV  MD regarding proximal subclavian stenosis, as it does compromise the LIMA.  Continue Angiomax until current bag runs out.  We will have to see if angina is improved after PCI of the SVG to diagonal.  If not, would then have to consider intervention to the proximal subclavian to improve flow to the LIMA graft.     Patient Profile     72 y.o. female w/ known coronary diseases, s/p CABG in 1994, admitted for elective cath for unstable angina. Complete angiographic details outlined above in cath report. S/p PCI to the SVG-diagonal. Also of note, 80% proximal subclavian stenosis was noted, w/ patent LIMA-LAD (may ultimately require PV intervention of subclavian, as it dose compromise the LIMA). Post cath recovery complicated by groin hematoma and syncope (felt to be vasovagally mediated from pain + soft BP).   Assessment & Plan    1. CAD: s/p CABG in 1994 and recent LHC for unstable angina 04/02/17. S/p PCI to SVG-Diag 04/02/17. SVG to left PDA patent.  LIMA to LAD patent.  SVG to diagonal had mid graft lesion of 95% and a drug-eluting stent was placed.  Her LV function was normal.  Proximal subclavian with severe calcific stenosis, 80% with significant decrease in left arm BP compared to central aortic  pressure. May need PV intervention. She denies CP. Continue ASA and Plavix, high dose statin and BB.   2. 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 Dr. Eldridge Dace to review with PV MD regarding intervention, as this can compromise the LIMA-LAD.   3. Groin Hematoma/ Retroperitoneal Bleed: CT showed changes consistent with acute postcatheterization hemorrhage into the lower abdominal subcutaneous tissue as well as into the retroperitoneum.  No findings to suggest active extravasation. Initial drop in Hgb from 11.4>>9.2. She had hypotension initially but responded well to IVFs. BP stable today. Groin less tender and softer today. Plan to ambulate with cardiac rehab. Will recheck H/H today to ensure levels are still stable.   4.  HLD: Has been on High dose Crestor and Zetia. No longer on Zetia due to insurance. LDL 121 in 2018. May need to consider PCSK9 inhibitor. We will refer to our lipid clinic.   5. HTN: BP controlled on current regimen.   6. PVCs: bigeminy noted on tele overnight. Currently NSR with only a few PVCs. EF normal by cath. Continue BB.   Dispo: possible d/c home today if able to ambulate ok with cardiac rehab. She will need close [facility] outpatient f/u and activity restriction.    For questions or updates, please contact [facility] Please consult [email or web address] for contact info under Cardiology/STEMI.      Signed, Robbie Lis, PA-C  04/05/2017, 7:47 AM

## 2017-04-05 NOTE — Discharge Summary (Signed)
Discharge Summary    Patient ID: Dawn Boyer,  MRN: [id number], DOB/AGE: 1945-07-18 72 y.o.  Admit date: 04/03/2017 Discharge date: 04/05/2017  Primary Care Provider: [name] Primary Cardiologist: Lance Muss, MD  Discharge Diagnoses    Active Problems:   Angina pectoris [facility])   Allergies No Known Allergies  Diagnostic Studies/Procedures    LHC 04/03/17 Procedures   CORONARY STENT INTERVENTION  LEFT HEART CATH AND CORS/GRAFTS ANGIOGRAPHY  Conclusion     Ost 1st Diag lesion is 100% stenosed. SVG to diagonal has a Mid Graft lesion is 95% stenosed.  Ost Cx to Prox Cx lesion is 100% stenosed. SVG to Left PDA is patent.  Ost LM to Mid LM lesion is 100% stenosed. LIMA to LAD.  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  There was difficulty navigating from the right groin to the aorta due to calcific lesion at the aortoiliac bifurcation. There was a 30 mm Hg pressure gradient from the right groin to the aorta.  SVG to diagonal has a Mid Graft lesion is 95% stenosed. A drug-eluting stent was successfully placed using a STENT SYNERGY DES 2.25X12.  Post intervention, there is a 0% residual stenosis.  The left ventricular systolic function is normal.  LV end diastolic pressure is normal.  The left ventricular ejection fraction is 55-65% by visual estimate.  Continue dual antiplatelet therapy for at least 6 months. Will discuss with PV MD regarding proximal subclavian stenosis, as it does compromise the LIMA.  Continue Angiomax until current bag runs out.  We will have to see if angina is improved after PCI of the SVG to diagonal. If not, would then have to consider intervention to the proximal subclavian to improve flow to the LIMA graft.       History of Present Illness     72 y.o. female w/ known coronary diseases, s/p CABG in 1994,  recently seen in the the office and complained fo symptoms c/w unstable angina. She presented to [name] for elective cath on 04/03/17.   Hospital Course     Cardiac cath was performed by Dr. Eldridge Dace, via the right femoral artery. Complete angiographic details outlined above in cath report. Pt underwent PCI to the SVG-diagonal. Also of note, 80% proximal subclavian stenosis was noted, w/ patent LIMA-LAD (may ultimately require PV intervention of subclavian, as it dose compromise the LIMA). Dr. Eldridge Dace discussed this with Dr. Kirke Corin and plan is for pt to see Dr. Kirke Corin in clinic next week to further discuss intervention.  Post cath, pt had no recurrent CP nor dyspnea but developed groin hematoma and syncope. She was hypotensive which improved with IVFs. CBC showed drop in Hgb from 11>>9. Abdominal CT was performed and showed changes consistent with acute postcatheterization hemorrhage into the lower abdominal subcutaneous tissue as well as into the retroperitoneum. No findings to suggest active extravasation. Pt was monitored closely and had no further s/s of worsening bleed. Her BP remained normal. Hgb remained stable with no further notable decline. Her femoral access site improved, with less tenderness and was soft. No recurrent syncope or near syncope and no dizziness with ambulation. She was able to ambulate with cardiac rehab w/o any issues. She was last seen and examined by Dr. Rennis Golden, who determined she was stable for discharge home. Pt was given instruction to rest and to avoid lifting once she returns home. She will  see Dr. Kirke CorinArida next week to discuss left subclavian artery stenting and she has f/u with Dr. Eldridge DaceVaranasi the week after.   The patient was discharged home on DAPT w/ ASA and Plavix, along with BB and statin.    Consultants: none    Discharge Vitals Blood pressure (!) 159/66, pulse 71, temperature 98.5 F (36.9 C), temperature source Oral, resp. rate 16, height 5\' 4"  (1.626 m), weight 124  lb 9 oz (56.5 kg), SpO2 99 %.  Filed Weights   04/03/17 0841 04/04/17 0330 04/05/17 0400  Weight: 125 lb (56.7 kg) 123 lb 7.3 oz (56 kg) 124 lb 9 oz (56.5 kg)    Labs & Radiologic Studies    CBC Recent Labs    04/04/17 0334 04/04/17 1528 04/05/17 0845  WBC 8.7  --   --   HGB 9.2* 9.1* 8.9*  HCT 28.0* 26.7* 26.4*  MCV 90.0  --   --   PLT 108*  --   --    Basic Metabolic Panel Recent Labs    40/98/1102/03/21 0334  NA 139  K 4.5  CL 110  CO2 21*  GLUCOSE 97  BUN 11  CREATININE 0.70  CALCIUM 8.3*   Liver Function Tests No results for input(s): AST, ALT, ALKPHOS, BILITOT, PROT, ALBUMIN in the last 72 hours. No results for input(s): LIPASE, AMYLASE in the last 72 hours. Cardiac Enzymes No results for input(s): CKTOTAL, CKMB, CKMBINDEX, TROPONINI in the last 72 hours. BNP Invalid input(s): POCBNP D-Dimer No results for input(s): DDIMER in the last 72 hours. Hemoglobin A1C No results for input(s): HGBA1C in the last 72 hours. Fasting Lipid Panel No results for input(s): CHOL, HDL, LDLCALC, TRIG, CHOLHDL, LDLDIRECT in the last 72 hours. Thyroid Function Tests No results for input(s): TSH, T4TOTAL, T3FREE, THYROIDAB in the last 72 hours.  Invalid input(s): FREET3 _____________  Ct Abdomen Pelvis Wo Contrast  Result Date: 04/03/2017 CLINICAL DATA:  Recent cardiac catheterization with groin hematoma and abdominal distension EXAM: CT ABDOMEN AND PELVIS WITHOUT CONTRAST TECHNIQUE: Multidetector CT imaging of the abdomen and pelvis was performed following the standard protocol without IV contrast. COMPARISON:  10/12/2014 FINDINGS: Lower chest: Chronic fibrotic changes are noted in the lung bases bilaterally. No acute infiltrate is seen. Hepatobiliary: No focal liver abnormality is seen. Status post cholecystectomy. No biliary dilatation. Pancreas: Unremarkable. No pancreatic ductal dilatation or surrounding inflammatory changes. Spleen: Normal in size without focal abnormality.  Adrenals/Urinary Tract: Adrenal glands are within normal limits. Normal enhancement is noted within the kidneys. Right renal cyst is seen similar to that seen on prior exam. Previously seen calculi are not well appreciated due to contrast within the collecting system. No obstructive changes are seen. Bladder is partially distended. Stomach/Bowel: No obstructive or inflammatory changes are noted. The appendix is well visualized and within normal limits. Vascular/Lymphatic: Diffuse vascular calcifications are identified without aneurysmal dilatation. In the right groin there are changes consistent with the recent cardiac catheterization with hemorrhage extending into the subcutaneous tissues as well as superiorly along the psoas muscle into the retroperitoneum. No findings to suggest active hemorrhage are identified at this time. Extension of the hemorrhage into the mons pubis on the right is noted as well. Reproductive: Uterus and bilateral adnexa are unremarkable. Other: No abdominal wall hernia or abnormality. No abdominopelvic ascites. Musculoskeletal: Degenerative changes of the lumbar spine are noted. There are changes consistent with compression deformity at T12 and L2. The L2 compression deformity is new from the prior exam. IMPRESSION: Changes consistent  with acute post catheterization hemorrhage extending inferiorly into the region of the mons pubis on the right and superiorly along the subcutaneous tissues extrinsic to the abdominal wall as well as along the right psoas muscle into the retroperitoneum. No findings to suggest active extravasation are noted at this time. L2 compression deformity which is new from the prior study. Electronically Signed   By: Alcide Clever M.D.   On: 04/03/2017 21:04   Disposition   Pt is being discharged home today in good condition.  Follow-up Plans & Appointments    Follow-up Information    Iran Ouch, MD Follow up on 04/10/2017.   Specialty:  Cardiology Why:   11:40 am (to discuss blockage in left neck/upper arm) Contact information: [address] [place] [address] [phone]        Corky Crafts, MD Follow up on 04/18/2017.   Specialties:  Cardiology, Radiology, Interventional Cardiology Why:  1:20 pm  Contact information: [address] [place] [address] [phone]          Discharge Instructions    Amb Referral to Cardiac Rehabilitation   Complete by:  As directed    Diagnosis:   Coronary Stents PTCA        Discharge Medications   Allergies as of 04/05/2017   No Known Allergies     Medication List    STOP taking these medications   aspirin 325 MG tablet Replaced by:  aspirin 81 MG chewable tablet   naproxen sodium 220 MG tablet Commonly known as:  ALEVE   omeprazole 40 MG capsule Commonly known as:  PRILOSEC     TAKE these medications   ALPRAZolam 1 MG tablet Commonly known as:  XANAX Take 1 mg by mouth daily.   amLODipine 5 MG tablet Commonly known as:  NORVASC Take 1 tablet (5 mg total) by mouth daily. What changed:  when to take this   aspirin 81 MG chewable tablet Chew 1 tablet (81 mg total) by mouth daily. Start taking on:  04/06/2017 Replaces:  aspirin 325 MG tablet   clopidogrel 75 MG tablet Commonly known as:  PLAVIX Take 1 tablet (75 mg total) by mouth daily with breakfast. Start taking on:  04/06/2017   ENEMA RE Place 1 each rectally 2 (two) times daily.   levothyroxine 50 MCG tablet Commonly known as:  SYNTHROID, LEVOTHROID Take 50 mcg by mouth daily before breakfast.   metoprolol succinate 50 MG 24 hr tablet Commonly known as:  TOPROL-XL Take 1 tablet by mouth in the morning and 1/2 tablet by mouth in the evening with or immediately following a meal. What changed:    how much to take  how to take this  when to take this  additional instructions   metoprolol succinate 50 MG 24 hr tablet Commonly known as:  TOPROL-XL Take 1  tablet (50 mg total) by mouth at bedtime. What changed:  You were already taking a medication with the same name, and this prescription was added. Make sure you understand how and when to take each.   metoprolol succinate 25 MG 24 hr tablet Commonly known as:  TOPROL-XL Take 1 tablet (25 mg total) by mouth daily with breakfast. What changed:  You were already taking a medication with the same name, and this prescription was added. Make sure you understand how and when to take each.   nitroGLYCERIN 0.4 MG SL tablet Commonly known as:  NITROSTAT Place 1 tablet (0.4 mg  total) under the tongue every 5 (five) minutes as needed for chest pain.   pantoprazole 40 MG tablet Commonly known as:  PROTONIX Take 1 tablet (40 mg total) by mouth daily.   quinapril 20 MG tablet Commonly known as:  ACCUPRIL Take 20 mg by mouth 2 (two) times daily.   rosuvastatin 40 MG tablet Commonly known as:  CRESTOR Take 40 mg by mouth daily. What changed:  Another medication with the same name was added. Make sure you understand how and when to take each.   rosuvastatin 40 MG tablet Commonly known as:  CRESTOR Take 1 tablet (40 mg total) by mouth daily at 6 PM. What changed:  You were already taking a medication with the same name, and this prescription was added. Make sure you understand how and when to take each.   sertraline 100 MG tablet Commonly known as:  ZOLOFT Take 50-100 mg by mouth See admin instructions. Take 50 mg in by mouth in am and 100 mg by mouth in the pm   tiZANidine 4 MG tablet Commonly known as:  ZANAFLEX Take 4 mg by mouth daily.   VISINE OP Place 1 drop into both eyes daily as needed (for dry eyes).          Outstanding Labs/Studies   None   Duration of Discharge Encounter   Greater than 30 minutes including physician time.  Signed, Robbie Lis PA-C 04/05/2017, 3:59 PM

## 2017-04-05 NOTE — Progress Notes (Signed)
CARDIAC REHAB PHASE I   PRE:  Rate/Rhythm: 78 SR  BP:  Supine:   Sitting: 162/44  Standing:    SaO2: 100%RA  MODE:  Ambulation: 500 ft   POST:  Rate/Rhythm: 97 SR  BP:  Supine:   Sitting: 177/59  Standing:    SaO2: 97%RA 0805-0848 Pt walked 500 ft with hand held asst and steady gait. Tolerated well. Reviewed NTG use and ex ed with pt and daughter who voiced understanding. Reinforced purpose of antiplatelet with stent.   Luetta Nuttingharlene Laasia Arcos, RN BSN  04/05/2017 8:42 AM

## 2017-04-08 ENCOUNTER — Other Ambulatory Visit: Payer: Self-pay | Admitting: Cardiology

## 2017-04-08 ENCOUNTER — Telehealth: Payer: Self-pay | Admitting: Cardiology

## 2017-04-08 MED ORDER — TRAMADOL HCL 50 MG PO TABS: 50.0000 mg | ORAL_TABLET | Freq: Four times a day (QID) | ORAL | 0 refills | Status: DC | PRN

## 2017-04-08 NOTE — Telephone Encounter (Signed)
Called with pain all over, and Blue at cath site, had bleed post procedure.  Will call in 15 tabs of Ultram 50 mg 1 every 6-8 hours.  If no improvement she should go to urgent care to be evaluated --she is to see Dr. Kirke CorinArida on Tuesday.  Site is no larger just has more discomfort.

## 2017-04-10 ENCOUNTER — Ambulatory Visit: Payer: Medicare HMO | Admitting: Cardiovascular Disease

## 2017-04-10 ENCOUNTER — Other Ambulatory Visit: Payer: Self-pay | Admitting: *Deleted

## 2017-04-10 ENCOUNTER — Encounter: Payer: Self-pay | Admitting: Cardiovascular Disease

## 2017-04-10 VITALS — BP 128/62 | HR 86 | Ht 64.0 in | Wt 127.0 lb

## 2017-04-10 DIAGNOSIS — I25118 Atherosclerotic heart disease of native coronary artery with other forms of angina pectoris: Secondary | ICD-10-CM

## 2017-04-10 DIAGNOSIS — I779 Disorder of arteries and arterioles, unspecified: Secondary | ICD-10-CM | POA: Diagnosis not present

## 2017-04-10 DIAGNOSIS — I10 Essential (primary) hypertension: Secondary | ICD-10-CM | POA: Diagnosis not present

## 2017-04-10 DIAGNOSIS — I771 Stricture of artery: Secondary | ICD-10-CM | POA: Diagnosis not present

## 2017-04-10 DIAGNOSIS — E782 Mixed hyperlipidemia: Secondary | ICD-10-CM

## 2017-04-10 DIAGNOSIS — I739 Peripheral vascular disease, unspecified: Secondary | ICD-10-CM

## 2017-04-10 NOTE — Progress Notes (Signed)
Cardiology Office Note   Date:  04/10/2017   ID:  Dawn Boyer, DOB 1945/09/07, MRN [id number]  PCP:  [name], MD  Cardiologist:  Dr. Eldridge Dace  No chief complaint on file.     History of Present Illness: Dawn Boyer is a 72 y.o. female who was referred by Dr. Eldridge Dace for evaluation of  left subclavian artery stenosis.  She has known history of coronary artery disease status post CABG in 1994, essential hypertension, moderate left carotid stenosis, hypothyroidism and hyperlipidemia.  There is remote history of tobacco use. She was seen recently for worsening exertional chest pain.  She underwent cardiac catheterization which showed severe underlying three-vessel coronary artery disease with patent LIMA to LAD, SVG to diagonal and SVG to left PDA.  There was severe stenosis and SVG to diagonal which was treated successfully with drug-eluting stent placement. She was found to have severe heavily calcified stenosis in the left subclavian artery likely compromising the flow into the LIMA.  There was a 30 mm gradient and pressure between both arms.  The patient was also noted to have calcified aortoiliac segments which made catheterization more difficult.  Postprocedure, the patient developed large groin bleed with retroperitoneal hematoma with associated syncope.  She continues to have a large bruise in that area.  She reports improvement in chest pain but she continues to be very fatigued.  She denies left arm claudication.  No significant dizziness.    Past Medical History:  Diagnosis Date  . Anemia    hx  . Anxiety   . Arthritis   . Carotid arterial disease (HCC)    a. 04/2015 Carotid U/S: RICA 1-39%, LICA 40-59%, no change.  . Coronary artery disease    a. 1994 s/p CABG;  b. 03/2012 MV: No ischemia/infarct.  . Depression   . Diverticulosis   . Elevated LFTs   . Fibrocystic breast   . GERD (gastroesophageal reflux disease)   . Hemorrhoid   . Hemorrhoids   . History of  hemolysis, elevated liver enzymes, and low platelet (HELLP) syndrome, currently pregnant    patient denies history  . Hyperlipidemia   . Hypertension   . Hypertensive heart disease   . Hypothyroidism   . Insomnia   . Nephrolithiasis   . PONV (postoperative nausea and vomiting)    after heart surgery    Past Surgical History:  Procedure Laterality Date  . CARDIAC CATHETERIZATION    . CHOLECYSTECTOMY  2002  . CORONARY ARTERY BYPASS GRAFT  1993  . CORONARY STENT INTERVENTION N/A 04/03/2017   Procedure: CORONARY STENT INTERVENTION;  Surgeon: Corky Crafts, MD;  [facility] INVASIVE CV LAB;  Service: Cardiovascular;  Laterality: N/A;  . EVALUATION UNDER ANESTHESIA WITH HEMORRHOIDECTOMY N/A 03/14/2012   Procedure: EXAM UNDER ANESTHESIA WITH Excisional HEMORRHOIDECTOMY and Hemorrhoidal banding;  Surgeon: Atilano Ina, MD;  Location: [facility] OR;  Service: General;  Laterality: N/A;  x 2 hemorrhoids  . HEMORRHOID SURGERY    . LEFT HEART CATH AND CORS/GRAFTS ANGIOGRAPHY N/A 04/03/2017   Procedure: LEFT HEART CATH AND CORS/GRAFTS ANGIOGRAPHY;  Surgeon: Corky Crafts, MD;  Location: [facility] INVASIVE CV LAB;  Service: Cardiovascular;  Laterality: N/A;  . TUBAL LIGATION  1970  . ULTRASOUND GUIDANCE FOR VASCULAR ACCESS  04/03/2017   Procedure: Ultrasound Guidance For Vascular Access;  Surgeon: Corky Crafts, MD;  Location: [facility] INVASIVE CV LAB;  Service: Cardiovascular;;     Current Outpatient Medications  Medication Sig Dispense Refill  .  ALPRAZolam (XANAX) 1 MG tablet Take 1 mg by mouth daily.     Marland Kitchen. amLODipine (NORVASC) 5 MG tablet Take 1 tablet (5 mg total) by mouth daily. (Patient taking differently: Take 5 mg by mouth at bedtime. ) 90 tablet 3  . aspirin 81 MG chewable tablet Chew 1 tablet (81 mg total) by mouth daily.    . clopidogrel (PLAVIX) 75 MG tablet Take 1 tablet (75 mg total) by mouth daily with breakfast. 30 tablet 11  . levothyroxine (SYNTHROID, LEVOTHROID) 50 MCG tablet Take  50 mcg by mouth daily before breakfast.     . metoprolol succinate (TOPROL-XL) 25 MG 24 hr tablet Take 1 tablet (25 mg total) by mouth daily with breakfast.    . metoprolol succinate (TOPROL-XL) 50 MG 24 hr tablet Take 1 tablet by mouth in the morning and 1/2 tablet by mouth in the evening with or immediately following a meal. (Patient taking differently: Take 25-50 mg by mouth See admin instructions. Take 25 mg by mouth in the morning and 50 mg by mouth in the evening with or immediately following a meal.) 135 tablet 2  . nitroGLYCERIN (NITROSTAT) 0.4 MG SL tablet Place 1 tablet (0.4 mg total) under the tongue every 5 (five) minutes as needed for chest pain. 90 tablet 3  . pantoprazole (PROTONIX) 40 MG tablet Take 1 tablet (40 mg total) by mouth daily. 30 tablet 3  . quinapril (ACCUPRIL) 20 MG tablet Take 20 mg by mouth 2 (two) times daily.     . rosuvastatin (CRESTOR) 40 MG tablet Take 40 mg by mouth daily.    . sertraline (ZOLOFT) 100 MG tablet Take 50-100 mg by mouth See admin instructions. Take 50 mg in by mouth in am and 100 mg by mouth in the pm    . Sodium Phosphates (ENEMA RE) Place 1 each rectally 2 (two) times daily.    . Tetrahydrozoline HCl (VISINE OP) Place 1 drop into both eyes daily as needed (for dry eyes).    Marland Kitchen. tiZANidine (ZANAFLEX) 4 MG tablet Take 4 mg by mouth daily.    . traMADol (ULTRAM) 50 MG tablet Take 1 tablet (50 mg total) by mouth every 6 (six) hours as needed. 20 tablet 0  . rosuvastatin (CRESTOR) 40 MG tablet Take 1 tablet (40 mg total) by mouth daily at 6 PM.     No current facility-administered medications for this visit.     Allergies:   Tramadol    Social History:  The patient  reports that she quit smoking about 34 years ago. Her smoking use included cigarettes. She has a 12.00 pack-year smoking history. She has never used smokeless tobacco. She reports that she does not drink alcohol or use drugs.   Family History:  The patient's family history includes  Diabetes in her daughter; Heart Problems in her son; Heart disease in her brother and sister.    ROS:  Please see the history of present illness.   Otherwise, review of systems are positive for none.   All other systems are reviewed and negative.    PHYSICAL EXAM: VS:  BP 128/62 (BP Location: Left Arm, Patient Position: Sitting, Cuff Size: Normal)   Pulse 86   Ht 5\' 4"  (1.626 m)   Wt 127 lb (57.6 kg)   SpO2 96%   BMI 21.80 kg/m  , BMI Body mass index is 21.8 kg/m. GEN: Well nourished, well developed, in no acute distress  HEENT: normal  Neck: no JVD.  Left  carotid bruit. Cardiac: RRR; no murmurs, rubs, or gallops,no edema  Respiratory:  clear to auscultation bilaterally, normal work of breathing GI: soft, nontender, nondistended, + BS MS: no deformity or atrophy  Skin: warm and dry, no rash Neuro:  Strength and sensation are intact Psych: euthymic mood, full affect Vascular: Right radial pulses normal.  Left radial pulses +1.  Femoral pulses are mildly diminished with large right groin hematoma.  EKG:  EKG is not ordered today.    Recent Labs: 04/04/2017: BUN 11; Creatinine, Ser 0.70; Platelets 108; Potassium 4.5; Sodium 139 04/05/2017: Hemoglobin 8.9    Lipid Panel    Component Value Date/Time   CHOL 239 (H) 02/01/2016 1351   TRIG 247 (H) 02/01/2016 1351   HDL 47 02/01/2016 1351   CHOLHDL 5.1 (H) 02/01/2016 1351   CHOLHDL 4 04/08/2014 1055   VLDL 28.4 04/08/2014 1055   LDLCALC 143 (H) 02/01/2016 1351   LDLDIRECT 90.8 01/08/2014 0828      Wt Readings from Last 3 Encounters:  04/10/17 127 lb (57.6 kg)  04/05/17 124 lb 9 oz (56.5 kg)  03/27/17 129 lb (58.5 kg)        PAD Screen 04/10/2017  Previous PAD dx? Yes  Previous surgical procedure? No  Pain with walking? Yes  Subsides with rest? Yes  Feet/toe relief with dangling? No  Painful, non-healing ulcers? No  Extremities discolored? No      ASSESSMENT AND PLAN:  1.  Significant left subclavian artery  stenosis.  I reviewed the angiogram myself and the vessel is heavily calcified in that area with significant risk of embolization during endovascular intervention.  I do think this will have to be treated percutaneously given that she has a LIMA to LAD.  Nonetheless, she has to recover from current right groin bleed.  I am also going to obtain carotid Doppler to evaluate vertebral patency before any planned intervention.  I will reevaluate in 1 month.  2.  Coronary artery disease involving graft coronary arteries with other forms of angina: She reports improvement in symptoms after recent stent placement to SVG to diagonal.  Continue dual antiplatelet therapy.  I requested CBC to check hemoglobin given recent bleed.  3.  Peripheral arterial disease: She does seem to have an aortoiliac disease but denies claudication at the present time.  She will likely require an ABI in the near future.  4.  Essential hypertension: Blood pressure is controlled on current medications.  5.  Hyperlipidemia: Continue treatment with rosuvastatin with a target LDL of less than 70.    Disposition:   FU with me in 1 month  Signed,  [name], MD  04/10/2017 5:36 PM    [facility]

## 2017-04-10 NOTE — Patient Instructions (Signed)
Medication Instructions:  No medication changes  Labwork: Your physician recommends that you return for lab work in: Today (CBC)   Testing/Procedures: Your physician has requested that you have a carotid duplex. This test is an ultrasound of the carotid arteries in your neck. It looks at blood flow through these arteries that supply the brain with blood. Allow one hour for this exam. There are no restrictions or special instructions.   Follow-Up: Your physician recommends that you schedule a follow-up appointment in: 1 month with Dr. Kirke CorinArida   Any Other Special Instructions Will Be Listed Below (If Applicable).     If you need a refill on your cardiac medications before your next appointment, please call your pharmacy.

## 2017-04-11 LAB — CBC
HEMATOCRIT: 26.5 % — AB (ref 34.0–46.6)
HEMOGLOBIN: 8.8 g/dL — AB (ref 11.1–15.9)
MCH: 30.3 pg (ref 26.6–33.0)
MCHC: 33.2 g/dL (ref 31.5–35.7)
MCV: 91 fL (ref 79–97)
Platelets: 191 10*3/uL (ref 150–379)
RBC: 2.9 x10E6/uL — ABNORMAL LOW (ref 3.77–5.28)
RDW: 14.2 % (ref 12.3–15.4)
WBC: 8 10*3/uL (ref 3.4–10.8)

## 2017-04-12 ENCOUNTER — Encounter: Payer: Self-pay | Admitting: Interventional Cardiology

## 2017-04-16 ENCOUNTER — Ambulatory Visit (HOSPITAL_COMMUNITY)
Admission: RE | Admit: 2017-04-16 | Discharge: 2017-04-16 | Disposition: A | Discharge status: Home / Self Care | Payer: Medicare HMO | Source: Ambulatory Visit | Attending: Cardiovascular Disease | Admitting: Cardiovascular Disease

## 2017-04-16 DIAGNOSIS — I739 Peripheral vascular disease, unspecified: Secondary | ICD-10-CM

## 2017-04-16 DIAGNOSIS — I779 Disorder of arteries and arterioles, unspecified: Secondary | ICD-10-CM | POA: Insufficient documentation

## 2017-04-17 NOTE — Progress Notes (Signed)
Cardiology Office Note   Date:  04/18/2017   ID:  Dawn Boyer, DOB [birth year], MRN [id number]  PCP:  [name], MD    No chief complaint on file.  CAD  Wt Readings from Last 3 Encounters:  04/18/17 124 lb 12.8 oz (56.6 kg)  04/10/17 127 lb (57.6 kg)  04/05/17 124 lb 9 oz (56.5 kg)       History of Present Illness: Dawn Boyer is a 72 y.o. female  who had CABG in 1994. She had throat tightness with walking at that time. She had great difficulty walking due to angina.     Her son passed away at age 50 from fentanyl + alcohol.  She reported angina which prompted cath in 3/19.  She had PCI to an SVG.  Unfortunately, this procedure was complicated by a groin bleed.   She was found to have a severe left subclavian stenosis as well which will require intervention.   Right groin and abdominal bruising has decreased.  THey have many questions about why the bleed happened.    She had some hemorrhoidal bleeding with her last bowel movement.   SHe uses enemas regularly and she tries not to strain.  Past Medical History:  Diagnosis Date  . Anemia    hx  . Anxiety   . Arthritis   . Carotid arterial disease (HCC)    a. 04/2015 Carotid U/S: RICA 1-39%, LICA 40-59%, no change.  . Coronary artery disease    a. 1994 s/p CABG;  b. 03/2012 MV: No ischemia/infarct.  . Depression   . Diverticulosis   . Elevated LFTs   . Fibrocystic breast   . GERD (gastroesophageal reflux disease)   . Hemorrhoid   . Hemorrhoids   . History of hemolysis, elevated liver enzymes, and low platelet (HELLP) syndrome, currently pregnant    patient denies history  . Hyperlipidemia   . Hypertension   . Hypertensive heart disease   . Hypothyroidism   . Insomnia   . Nephrolithiasis   . PONV (postoperative nausea and vomiting)    after heart surgery    Past Surgical History:  Procedure Laterality Date  . CARDIAC CATHETERIZATION    . CHOLECYSTECTOMY  2002  . CORONARY ARTERY BYPASS GRAFT  1993    . CORONARY STENT INTERVENTION N/A 04/03/2017   Procedure: CORONARY STENT INTERVENTION;  Surgeon: Corky Crafts, MD;  Location: [facility] INVASIVE CV LAB;  Service: Cardiovascular;  Laterality: N/A;  . EVALUATION UNDER ANESTHESIA WITH HEMORRHOIDECTOMY N/A 03/14/2012   Procedure: EXAM UNDER ANESTHESIA WITH Excisional HEMORRHOIDECTOMY and Hemorrhoidal banding;  Surgeon: Atilano Ina, MD;  Location: [facility] OR;  Service: General;  Laterality: N/A;  x 2 hemorrhoids  . HEMORRHOID SURGERY    . LEFT HEART CATH AND CORS/GRAFTS ANGIOGRAPHY N/A 04/03/2017   Procedure: LEFT HEART CATH AND CORS/GRAFTS ANGIOGRAPHY;  Surgeon: Corky Crafts, MD;  Location: [facility] INVASIVE CV LAB;  Service: Cardiovascular;  Laterality: N/A;  . TUBAL LIGATION  1970  . ULTRASOUND GUIDANCE FOR VASCULAR ACCESS  04/03/2017   Procedure: Ultrasound Guidance For Vascular Access;  Surgeon: Corky Crafts, MD;  Location: [facility] INVASIVE CV LAB;  Service: Cardiovascular;;     Current Outpatient Medications  Medication Sig Dispense Refill  . ALPRAZolam (XANAX) 1 MG tablet Take 1 mg by mouth daily.     Marland Kitchen amLODipine (NORVASC) 5 MG tablet Take 1 tablet (5 mg total) by mouth daily. (Patient taking differently: Take 5 mg by mouth at bedtime. )  90 tablet 3  . aspirin 81 MG chewable tablet Chew 1 tablet (81 mg total) by mouth daily.    . clopidogrel (PLAVIX) 75 MG tablet Take 1 tablet (75 mg total) by mouth daily with breakfast. 30 tablet 11  . levothyroxine (SYNTHROID, LEVOTHROID) 50 MCG tablet Take 50 mcg by mouth daily before breakfast.     . metoprolol succinate (TOPROL-XL) 50 MG 24 hr tablet Take 1 tablet by mouth in the morning and 1/2 tablet by mouth in the evening with or immediately following a meal. (Patient taking differently: Take 25-50 mg by mouth See admin instructions. Take 25 mg by mouth in the morning and 50 mg by mouth in the evening with or immediately following a meal.) 135 tablet 2  . nitroGLYCERIN (NITROSTAT) 0.4 MG SL tablet  Place 1 tablet (0.4 mg total) under the tongue every 5 (five) minutes as needed for chest pain. 90 tablet 3  . pantoprazole (PROTONIX) 40 MG tablet Take 1 tablet (40 mg total) by mouth daily. 30 tablet 3  . quinapril (ACCUPRIL) 20 MG tablet Take 20 mg by mouth 2 (two) times daily.     . rosuvastatin (CRESTOR) 40 MG tablet Take 40 mg by mouth daily.    . rosuvastatin (CRESTOR) 40 MG tablet Take 1 tablet (40 mg total) by mouth daily at 6 PM.    . sertraline (ZOLOFT) 100 MG tablet Take 50-100 mg by mouth See admin instructions. Take 50 mg in by mouth in am and 100 mg by mouth in the pm    . Sodium Phosphates (ENEMA RE) Place 1 each rectally 2 (two) times daily.    . Tetrahydrozoline HCl (VISINE OP) Place 1 drop into both eyes daily as needed (for dry eyes).    Marland Kitchen. tiZANidine (ZANAFLEX) 4 MG tablet Take 4 mg by mouth daily.    . traMADol (ULTRAM) 50 MG tablet Take 1 tablet (50 mg total) by mouth every 6 (six) hours as needed. 20 tablet 0   No current facility-administered medications for this visit.     Allergies:   Tramadol    Social History:  The patient  reports that she quit smoking about 34 years ago. Her smoking use included cigarettes. She has a 12.00 pack-year smoking history. She has never used smokeless tobacco. She reports that she does not drink alcohol or use drugs.   Family History:  The patient's family history includes Diabetes in her daughter; Heart Problems in her son; Heart disease in her brother and sister.    ROS:  Please see the history of present illness.   Otherwise, review of systems are positive for right groin bruising.   All other systems are reviewed and negative.    PHYSICAL EXAM: VS:  BP 100/64   Pulse 78   Ht 5\' 4"  (1.626 m)   Wt 124 lb 12.8 oz (56.6 kg)   SpO2 95%   BMI 21.42 kg/m  , BMI Body mass index is 21.42 kg/m. GEN: Well nourished, well developed, in no acute distress  HEENT: normal  Neck: no JVD, carotid bruits, or masses Cardiac: RRR; no  murmurs, rubs, or gallops,no edema  Respiratory:  clear to auscultation bilaterally, normal work of breathing GI: soft, nontender, nondistended, + BS MS: no deformity or atrophy ; right upper groin bruised and firm; some bruising spread across the midline Skin: warm and dry, no rash Neuro:  Strength and sensation are intact Psych: euthymic mood, full affect    Recent Labs: 04/04/2017: BUN  11; Creatinine, Ser 0.70; Potassium 4.5; Sodium 139 04/10/2017: Hemoglobin 8.8; Platelets 191   Lipid Panel    Component Value Date/Time   CHOL 239 (H) 02/01/2016 1351   TRIG 247 (H) 02/01/2016 1351   HDL 47 02/01/2016 1351   CHOLHDL 5.1 (H) 02/01/2016 1351   CHOLHDL 4 04/08/2014 1055   VLDL 28.4 04/08/2014 1055   LDLCALC 143 (H) 02/01/2016 1351   LDLDIRECT 90.8 01/08/2014 0828     Other studies Reviewed: Additional studies/ records that were reviewed today with results demonstrating: cath results reviewed.   ASSESSMENT AND PLAN:  1. CAD : Angina improved.  Still some compromise of the LIMA to LAD due to subclavian stenosis. 2. PAD: Following with Dr. Kirke Corin.  Planning for left subclavian stent.  Right groin improving.  Bruising is better.  2+ right DP pulse.  Carotid Doppler being done in preparation for the subclavian stent. 3. Hyperlipidemia: Consdier PCSK9 inhibitor since LDL was bove target on high dose Crestor.  Will readdress after her PAD has been managed. 4. Hemorrhoids: She had some bleeding recently.  She had hemorrhoid surgery in the past.  She has to use enemas to use the bathroom regularly.  If bleeding persists, will refer to [facility] per her preference.    Current medicines are reviewed at length with the patient today.  The patient concerns regarding her medicines were addressed.  The following changes have been made:  No change  Labs/ tests ordered today include:  No orders of the defined types were placed in this encounter.   Recommend 150 minutes/week of aerobic  exercise Low fat, low carb, high fiber diet recommended  Disposition:   FU after PV procedure   Signed, Lance Muss, MD  04/18/2017 1:32 PM    [facility] [address] [place], [place], [place]  [address] Phone: [phone]; Fax: [phone]

## 2017-04-18 ENCOUNTER — Ambulatory Visit: Payer: Medicare HMO | Admitting: Interventional Cardiology

## 2017-04-18 ENCOUNTER — Encounter: Payer: Self-pay | Admitting: Interventional Cardiology

## 2017-04-18 VITALS — BP 100/64 | HR 78 | Ht 64.0 in | Wt 124.8 lb

## 2017-04-18 DIAGNOSIS — E782 Mixed hyperlipidemia: Secondary | ICD-10-CM | POA: Diagnosis not present

## 2017-04-18 DIAGNOSIS — I25118 Atherosclerotic heart disease of native coronary artery with other forms of angina pectoris: Secondary | ICD-10-CM

## 2017-04-18 DIAGNOSIS — I771 Stricture of artery: Secondary | ICD-10-CM | POA: Diagnosis not present

## 2017-04-18 DIAGNOSIS — K649 Unspecified hemorrhoids: Secondary | ICD-10-CM

## 2017-04-18 NOTE — Patient Instructions (Addendum)
Medication Instructions:  Your physician recommends that you continue on your current medications as directed. Please refer to the Current Medication list given to you today.   Labwork: None ordered  Testing/Procedures: None ordered  Follow-Up: Keep follow-up appointment with Dr. Kirke CorinArida on 05/15/17 at 11:20 AM.  Any Other Special Instructions Will Be Listed Below (If Applicable).  Let us know if you continue to have any bleeding issues and we will refer you to a GI specialist.   If you need a refill on your cardiac medications before your next appointment, please call your pharmacy.

## 2017-04-26 DIAGNOSIS — I209 Angina pectoris, unspecified: Secondary | ICD-10-CM | POA: Diagnosis not present

## 2017-04-26 DIAGNOSIS — D62 Acute posthemorrhagic anemia: Secondary | ICD-10-CM | POA: Diagnosis not present

## 2017-04-26 DIAGNOSIS — R7309 Other abnormal glucose: Secondary | ICD-10-CM | POA: Diagnosis not present

## 2017-04-26 DIAGNOSIS — I10 Essential (primary) hypertension: Secondary | ICD-10-CM | POA: Diagnosis not present

## 2017-04-26 DIAGNOSIS — I2510 Atherosclerotic heart disease of native coronary artery without angina pectoris: Secondary | ICD-10-CM | POA: Diagnosis not present

## 2017-04-26 DIAGNOSIS — Z09 Encounter for follow-up examination after completed treatment for conditions other than malignant neoplasm: Secondary | ICD-10-CM | POA: Diagnosis not present

## 2017-05-15 ENCOUNTER — Ambulatory Visit: Payer: Medicare HMO | Admitting: Cardiovascular Disease

## 2017-05-15 VITALS — BP 132/70 | HR 74 | Ht 64.0 in | Wt 129.0 lb

## 2017-05-15 DIAGNOSIS — I2510 Atherosclerotic heart disease of native coronary artery without angina pectoris: Secondary | ICD-10-CM | POA: Diagnosis not present

## 2017-05-15 DIAGNOSIS — I10 Essential (primary) hypertension: Secondary | ICD-10-CM | POA: Diagnosis not present

## 2017-05-15 DIAGNOSIS — I771 Stricture of artery: Secondary | ICD-10-CM

## 2017-05-15 DIAGNOSIS — I739 Peripheral vascular disease, unspecified: Secondary | ICD-10-CM

## 2017-05-15 DIAGNOSIS — E782 Mixed hyperlipidemia: Secondary | ICD-10-CM | POA: Diagnosis not present

## 2017-05-15 NOTE — Patient Instructions (Signed)
Medication Instructions: Your physician recommends that you continue on your current medications as directed. Please refer to the Current Medication list given to you today.  If you need a refill on your cardiac medications before your next appointment, please call your pharmacy.    Follow-Up: Your physician wants you to follow-up on 07/17/17 at 11:20 with Dr. Kirke Corin.  Thank you for choosing Heartcare at [facility]!!

## 2017-05-15 NOTE — Progress Notes (Signed)
Cardiology Office Note   Date:  05/15/2017   ID:  Dawn Boyer, DOB 1945-12-18, MRN [id number]  PCP:  [name], MD  Cardiologist:  Dr. Eldridge Dace  Chief Complaint  Patient presents with  . Follow-up    1 month      History of Present Illness: Dawn Boyer is a 72 y.o. female who is here today for a follow up visit regarding left subclavian artery stenosis.  She has known history of coronary artery disease status post CABG in 1994, essential hypertension, moderate left carotid stenosis, hypothyroidism and hyperlipidemia.  There is remote history of tobacco use. She was seen recently for worsening exertional chest pain.  She underwent cardiac catheterization last month which showed severe underlying three-vessel coronary artery disease with patent LIMA to LAD, SVG to diagonal and SVG to left PDA.  There was severe stenosis and SVG to diagonal which was treated successfully with drug-eluting stent placement. She was found to have severe heavily calcified stenosis in the left subclavian artery likely compromising the flow into the LIMA.  There was a 30 mm gradient and pressure between both arms.   Unfortunately, the patient developed a large groin hematoma with retroperitoneal bleed after the procedure.   She is here to discuss proceeding with left subclavian artery angioplasty and stent placement.  She denies any chest pain, shortness of breath, left arm discomfort or dizziness.  Past Medical History:  Diagnosis Date  . Anemia    hx  . Anxiety   . Arthritis   . Carotid arterial disease (HCC)    a. 04/2015 Carotid U/S: RICA 1-39%, LICA 40-59%, no change.  . Coronary artery disease    a. 1994 s/p CABG;  b. 03/2012 MV: No ischemia/infarct.  . Depression   . Diverticulosis   . Elevated LFTs   . Fibrocystic breast   . GERD (gastroesophageal reflux disease)   . Hemorrhoid   . Hemorrhoids   . History of hemolysis, elevated liver enzymes, and low platelet (HELLP) syndrome, currently  pregnant    patient denies history  . Hyperlipidemia   . Hypertension   . Hypertensive heart disease   . Hypothyroidism   . Insomnia   . Nephrolithiasis   . PONV (postoperative nausea and vomiting)    after heart surgery    Past Surgical History:  Procedure Laterality Date  . CARDIAC CATHETERIZATION    . CHOLECYSTECTOMY  2002  . CORONARY ARTERY BYPASS GRAFT  1993  . CORONARY STENT INTERVENTION N/A 04/03/2017   Procedure: CORONARY STENT INTERVENTION;  Surgeon: Corky Crafts, MD;  Location[facility] INVASIVE CV LAB;  Service: Cardiovascular;  Laterality: N/A;  . EVALUATION UNDER ANESTHESIA WITH HEMORRHOIDECTOMY N/A 03/14/2012   Procedure: EXAM UNDER ANESTHESIA WITH Excisional HEMORRHOIDECTOMY and Hemorrhoidal banding;  Surgeon: Atilano Ina, MD;  Location: [facility] OR;  Service: General;  Laterality: N/A;  x 2 hemorrhoids  . HEMORRHOID SURGERY    . LEFT HEART CATH AND CORS/GRAFTS ANGIOGRAPHY N/A 04/03/2017   Procedure: LEFT HEART CATH AND CORS/GRAFTS ANGIOGRAPHY;  Surgeon: Corky Crafts, MD;  Location: [facility] INVASIVE CV LAB;  Service: Cardiovascular;  Laterality: N/A;  . TUBAL LIGATION  1970  . ULTRASOUND GUIDANCE FOR VASCULAR ACCESS  04/03/2017   Procedure: Ultrasound Guidance For Vascular Access;  Surgeon: Corky Crafts, MD;  Location: [facility] INVASIVE CV LAB;  Service: Cardiovascular;;     Current Outpatient Medications  Medication Sig Dispense Refill  . ALPRAZolam (XANAX) 1 MG tablet Take 1 mg by  mouth daily.     Marland Kitchen amLODipine (NORVASC) 5 MG tablet Take 1 tablet (5 mg total) by mouth daily. (Patient taking differently: Take 5 mg by mouth at bedtime. ) 90 tablet 3  . aspirin 81 MG chewable tablet Chew 1 tablet (81 mg total) by mouth daily.    . clopidogrel (PLAVIX) 75 MG tablet Take 1 tablet (75 mg total) by mouth daily with breakfast. 30 tablet 11  . levothyroxine (SYNTHROID, LEVOTHROID) 50 MCG tablet Take 50 mcg by mouth daily before breakfast.     . metoprolol succinate (TOPROL-XL)  50 MG 24 hr tablet Take 1 tablet by mouth in the morning and 1/2 tablet by mouth in the evening with or immediately following a meal. (Patient taking differently: Take 25-50 mg by mouth See admin instructions. Take 25 mg by mouth in the morning and 50 mg by mouth in the evening with or immediately following a meal.) 135 tablet 2  . nitroGLYCERIN (NITROSTAT) 0.4 MG SL tablet Place 1 tablet (0.4 mg total) under the tongue every 5 (five) minutes as needed for chest pain. 90 tablet 3  . pantoprazole (PROTONIX) 40 MG tablet Take 1 tablet (40 mg total) by mouth daily. 30 tablet 3  . quinapril (ACCUPRIL) 20 MG tablet Take 20 mg by mouth 2 (two) times daily.     . rosuvastatin (CRESTOR) 40 MG tablet Take 1 tablet (40 mg total) by mouth daily at 6 PM.    . sertraline (ZOLOFT) 100 MG tablet Take 50-100 mg by mouth See admin instructions. Take 50 mg in by mouth in am and 100 mg by mouth in the pm    . Sodium Phosphates (ENEMA RE) Place 1 each rectally 2 (two) times daily.    . Tetrahydrozoline HCl (VISINE OP) Place 1 drop into both eyes daily as needed (for dry eyes).    Marland Kitchen tiZANidine (ZANAFLEX) 4 MG tablet Take 4 mg by mouth daily.     No current facility-administered medications for this visit.     Allergies:   Tramadol    Social History:  The patient  reports that she quit smoking about 34 years ago. Her smoking use included cigarettes. She has a 12.00 pack-year smoking history. She has never used smokeless tobacco. She reports that she does not drink alcohol or use drugs.   Family History:  The patient's family history includes Diabetes in her daughter; Heart Problems in her son; Heart disease in her brother and sister.    ROS:  Please see the history of present illness.   Otherwise, review of systems are positive for none.   All other systems are reviewed and negative.    PHYSICAL EXAM: VS:  BP 132/70 (BP Location: Left Arm, Patient Position: Sitting, Cuff Size: Normal)   Pulse 74   Ht   (1.626 m)   Wt 129 lb (58.5 kg)   BMI 22.14 kg/m  , BMI Body mass index is 22.14 kg/m. GEN: Well nourished, well developed, in no acute distress  HEENT: normal  Neck: no JVD.  Left carotid bruit. Cardiac: RRR; no murmurs, rubs, or gallops,no edema  Respiratory:  clear to auscultation bilaterally, normal work of breathing GI: soft, nontender, nondistended, + BS MS: no deformity or atrophy  Skin: warm and dry, no rash Neuro:  Strength and sensation are intact Psych: euthymic mood, full affect Vascular: Right radial pulses normal.  Left radial pulses +1.   Femoral pulses are slightly diminished bilaterally.  EKG:  EKG is not  ordered today.    Recent Labs: 04/04/2017: BUN 11; Creatinine, Ser 0.70; Potassium 4.5; Sodium 139 04/10/2017: Hemoglobin 8.8; Platelets 191    Lipid Panel    Component Value Date/Time   CHOL 239 (H) 02/01/2016 1351   TRIG 247 (H) 02/01/2016 1351   HDL 47 02/01/2016 1351   CHOLHDL 5.1 (H) 02/01/2016 1351   CHOLHDL 4 04/08/2014 1055   VLDL 28.4 04/08/2014 1055   LDLCALC 143 (H) 02/01/2016 1351   LDLDIRECT 90.8 01/08/2014 0828      Wt Readings from Last 3 Encounters:  05/15/17 129 lb (58.5 kg)  04/18/17 124 lb 12.8 oz (56.6 kg)  04/10/17 127 lb (57.6 kg)        PAD Screen 04/10/2017  Previous PAD dx? Yes  Previous surgical procedure? No  Pain with walking? Yes  Subsides with rest? Yes  Feet/toe relief with dangling? No  Painful, non-healing ulcers? No  Extremities discolored? No      ASSESSMENT AND PLAN:  1.  Significant left subclavian artery stenosis.  We again reviewed the recent angiogram which showed heavily calcified significant stenosis in the left subclavian artery.  This is likely compromising the blood flow to LIMA to LAD and there is an indication for revascularization.   Unfortunately, the patient is still quite upset and extremely nervous about proceeding given the bleeding complications after the most recent cardiac  catheterization.  She wants to hold off for few months before we can proceed with this.  I will have her come back in 2 months.  2.  Coronary artery disease involving graft coronary arteries with other forms of angina: She reports improvement in symptoms after recent stent placement to SVG to diagonal.  Continue dual antiplatelet therapy.    3.  Peripheral arterial disease: She has palpable pulses in the lower extremity and denies any claudication.  4.  Essential hypertension: Blood pressure is controlled on current medications.  5.  Hyperlipidemia: Continue treatment with rosuvastatin with a target LDL of less than 70.    Disposition:   FU with me in 2 months  Signed,  [name], MD  05/15/2017 11:31 AM    [facility]

## 2017-07-17 ENCOUNTER — Ambulatory Visit: Payer: Medicare HMO | Admitting: Cardiovascular Disease

## 2017-07-17 ENCOUNTER — Encounter: Payer: Self-pay | Admitting: Cardiovascular Disease

## 2017-07-17 VITALS — BP 112/69 | HR 77 | Ht 63.0 in | Wt 126.6 lb

## 2017-07-17 DIAGNOSIS — I10 Essential (primary) hypertension: Secondary | ICD-10-CM | POA: Diagnosis not present

## 2017-07-17 DIAGNOSIS — I739 Peripheral vascular disease, unspecified: Secondary | ICD-10-CM

## 2017-07-17 DIAGNOSIS — Z01818 Encounter for other preprocedural examination: Secondary | ICD-10-CM

## 2017-07-17 DIAGNOSIS — I771 Stricture of artery: Secondary | ICD-10-CM

## 2017-07-17 DIAGNOSIS — E782 Mixed hyperlipidemia: Secondary | ICD-10-CM

## 2017-07-17 DIAGNOSIS — I25118 Atherosclerotic heart disease of native coronary artery with other forms of angina pectoris: Secondary | ICD-10-CM | POA: Diagnosis not present

## 2017-07-17 NOTE — H&P (View-Only) (Signed)
  Cardiology Office Note   Date:  07/17/2017   ID:  Dawn Boyer, DOB 11/22/1945, MRN [id number]  PCP:  Morrow, Aaron, MD  Cardiologist:  Dr. Varanasi  No chief complaint on file.     History of Present Illness: Dawn Boyer is a 72 y.o. female who is here today for a follow up visit regarding left subclavian artery stenosis.  She has known history of coronary artery disease status post CABG in 1994, essential hypertension, moderate left carotid stenosis, hypothyroidism and hyperlipidemia.  There is remote history of tobacco use. She had worsening angina early this year.  She underwent cardiac catheterization in April which showed severe underlying three-vessel coronary artery disease with patent LIMA to LAD, SVG to diagonal and SVG to left PDA.  There was severe stenosis and SVG to diagonal which was treated successfully with drug-eluting stent placement. She was found to have severe heavily calcified stenosis in the left subclavian artery likely compromising the flow into the LIMA.  There was a 30 mm gradient and pressure between both arms.   Unfortunately, the patient developed a large groin hematoma with retroperitoneal bleed after the procedure.    She has been doing well with stable exertional dyspnea no recent chest pain.  No left arm discomfort.  She has mild bilateral calf discomfort with overexertion.  Past Medical History:  Diagnosis Date  . Anemia    hx  . Anxiety   . Arthritis   . Carotid arterial disease (HCC)    a. 04/2015 Carotid U/S: RICA 1-39%, LICA 40-59%, no change.  . Coronary artery disease    a. 1994 s/p CABG;  b. 03/2012 MV: No ischemia/infarct.  . Depression   . Diverticulosis   . Elevated LFTs   . Fibrocystic breast   . GERD (gastroesophageal reflux disease)   . Hemorrhoid   . Hemorrhoids   . History of hemolysis, elevated liver enzymes, and low platelet (HELLP) syndrome, currently pregnant    patient denies history  . Hyperlipidemia   . Hypertension    . Hypertensive heart disease   . Hypothyroidism   . Insomnia   . Nephrolithiasis   . PONV (postoperative nausea and vomiting)    after heart surgery    Past Surgical History:  Procedure Laterality Date  . CARDIAC CATHETERIZATION    . CHOLECYSTECTOMY  2002  . CORONARY ARTERY BYPASS GRAFT  1993  . CORONARY STENT INTERVENTION N/A 04/03/2017   Procedure: CORONARY STENT INTERVENTION;  Surgeon: Varanasi, Jayadeep S, MD;  Location: [facility] INVASIVE [facility];  Service: Cardiovascular;  Laterality: N/A;  . EVALUATION UNDER ANESTHESIA WITH HEMORRHOIDECTOMY N/A 03/14/2012   Procedure: EXAM UNDER ANESTHESIA WITH Excisional HEMORRHOIDECTOMY and Hemorrhoidal banding;  Surgeon: Eric M Wilson, MD;  Location: [facility] OR;  Service: General;  Laterality: N/A;  x 2 hemorrhoids  . HEMORRHOID SURGERY    . LEFT HEART CATH AND CORS/GRAFTS ANGIOGRAPHY N/A 04/03/2017   Procedure: LEFT HEART CATH AND CORS/GRAFTS ANGIOGRAPHY;  Surgeon: Varanasi, Jayadeep S, MD;  Location: [facility];  Service: Cardiovascular;  Laterality: N/A;  . TUBAL LIGATION  1970  . ULTRASOUND GUIDANCE FOR VASCULAR ACCESS  04/03/2017   Procedure: Ultrasound Guidance For Vascular Access;  Surgeon: Varanasi, Jayadeep S, MD;  Location: [facility];  Service: Cardiovascular;;     Current Outpatient Medications  Medication Sig Dispense Refill  . ALPRAZolam (XANAX) 1 MG tablet Take 1 mg by mouth daily.     . amLODipine (NORVASC) 5 MG tablet   Take 1 tablet (5 mg total) by mouth daily. 90 tablet 3  . aspirin 81 MG chewable tablet Chew 1 tablet (81 mg total) by mouth daily.    . clopidogrel (PLAVIX) 75 MG tablet Take 1 tablet (75 mg total) by mouth daily with breakfast. 30 tablet 11  . levothyroxine (SYNTHROID, LEVOTHROID) 50 MCG tablet Take 50 mcg by mouth daily before breakfast.     . metoprolol succinate (TOPROL-XL) 50 MG 24 hr tablet Take half (1/2) tablet (25 mg) by mouth in the morning and one (1) tablet (50 mg) by mouth in the evening. Take with  or immediately following a meal.    . nitroGLYCERIN (NITROSTAT) 0.4 MG SL tablet Place 0.4 mg under the tongue every 5 (five) minutes x 3 doses as needed for chest pain.    . pantoprazole (PROTONIX) 40 MG tablet Take 1 tablet (40 mg total) by mouth daily. 30 tablet 3  . quinapril (ACCUPRIL) 20 MG tablet Take 20 mg by mouth 2 (two) times daily.     . rosuvastatin (CRESTOR) 40 MG tablet Take 1 tablet (40 mg total) by mouth daily at 6 PM.    . sertraline (ZOLOFT) 100 MG tablet Take 50-100 mg by mouth See admin instructions. Take 50 mg in by mouth in am and 100 mg by mouth in the pm    . Sodium Phosphates (ENEMA RE) Place 1 each rectally 2 (two) times daily.    . Tetrahydrozoline HCl (VISINE OP) Place 1 drop into both eyes daily as needed (for dry eyes).    . tiZANidine (ZANAFLEX) 4 MG tablet Take 4 mg by mouth daily.     No current facility-administered medications for this visit.     Allergies:   Tramadol    Social History:  The patient  reports that she quit smoking about 34 years ago. Her smoking use included cigarettes. She has a 12.00 pack-year smoking history. She has never used smokeless tobacco. She reports that she does not drink alcohol or use drugs.   Family History:  The patient's family history includes Diabetes in her daughter; Heart Problems in her son; Heart disease in her brother and sister.    ROS:  Please see the history of present illness.   Otherwise, review of systems are positive for none.   All other systems are reviewed and negative.    PHYSICAL EXAM: VS:  BP 112/69   Pulse 77   Ht 5' 3" (1.6 m)   Wt 126 lb 9.6 oz (57.4 kg)   BMI 22.43 kg/m  , BMI Body mass index is 22.43 kg/m. GEN: Well nourished, well developed, in no acute distress  HEENT: normal  Neck: no JVD.  Left carotid bruit. Cardiac: RRR; no murmurs, rubs, or gallops,no edema  Respiratory:  clear to auscultation bilaterally, normal work of breathing GI: soft, nontender, nondistended, + BS MS: no  deformity or atrophy  Skin: warm and dry, no rash Neuro:  Strength and sensation are intact Psych: euthymic mood, full affect Vascular: Right radial pulses normal.  Left radial pulses +1.   Femoral pulses are slightly diminished bilaterally.  EKG:  EKG is not ordered today.    Recent Labs: 04/04/2017: BUN 11; Creatinine, Ser 0.70; Potassium 4.5; Sodium 139 04/10/2017: Hemoglobin 8.8; Platelets 191    Lipid Panel    Component Value Date/Time   CHOL 239 (H) 02/01/2016 1351   TRIG 247 (H) 02/01/2016 1351   HDL 47 02/01/2016 1351   CHOLHDL 5.1 (H) 02/01/2016   1351   CHOLHDL 4 04/08/2014 1055   VLDL 28.4 04/08/2014 1055   LDLCALC 143 (H) 02/01/2016 1351   LDLDIRECT 90.8 01/08/2014 0828      Wt Readings from Last 3 Encounters:  07/17/17 126 lb 9.6 oz (57.4 kg)  05/15/17 129 lb (58.5 kg)  04/18/17 124 lb 12.8 oz (56.6 kg)        PAD Screen 04/10/2017  Previous PAD dx? Yes  Previous surgical procedure? No  Pain with walking? Yes  Subsides with rest? Yes  Feet/toe relief with dangling? No  Painful, non-healing ulcers? No  Extremities discolored? No      ASSESSMENT AND PLAN:  1.  Significant left subclavian artery stenosis.  I again reviewed the angiogram with patient and daughter which showed heavily calcified significant stenosis in the left subclavian artery.  This is likely compromising the blood flow to LIMA to LAD and there is an indication for revascularization.   I discussed the procedure in details as well as risks and benefits and she is agreeable to proceed. Planned access is via the right common femoral artery.  2.  Coronary artery disease involving graft coronary arteries with other forms of angina: She reports improvement in symptoms after recent stent placement to SVG to diagonal.  Continue dual antiplatelet therapy.    3.  Peripheral arterial disease: She has palpable pulses in the lower extremity .  She does complain of what seems to be mild bilateral calf  claudication.  Lower extremity arterial Doppler can be considered in the near future.  4.  Essential hypertension: Blood pressure is controlled on current medications.  5.  Hyperlipidemia: Continue treatment with rosuvastatin with a target LDL of less than 70.    Disposition:   FU with me in 1 months  Signed,  Muhammad Arida, MD  07/17/2017 11:25 AM    [facility] 

## 2017-07-17 NOTE — Patient Instructions (Signed)
Medication Instructions: Your physician recommends that you continue on your current medications as directed. Please refer to the Current Medication list given to you today.  If you need a refill on your cardiac medications before your next appointment, please call your pharmacy.   Follow-Up: Your physician wants you to follow-up on 8/27 at 10 am with Dr. Kirke CorinArida.   Thank you for choosing [facility]!!         [facility] [address] [place] [address] Dept: [phone] Loc: [phone]  Sheliah HatchBillie J Boyer  07/17/2017  You are scheduled for a Peripheral Angiogram on Wednesday, July 24 with Dr. Lorine BearsMuhammad Arida.  1. Please arrive at the [facility] A[facility] at [facility] : [address] [place], [place] [address] at 10:30 AM (two hours before your procedure to ensure your preparation). Free valet parking service is available.   Special note: Every effort is made to have your procedure done on time. Please understand that emergencies sometimes delay scheduled procedures.  2. Diet: Do not eat anything after midnight. You may have clear liquids until 5 am then nothing to drink after that.  3. Labs: Labs will be done today at the [facility] office  4. Medication instructions in preparation for your procedure: None to hold  On the morning of your procedure, take your Aspirin and Plavix/Clopidogrel and any morning medicines NOT listed above.  You may use sips of water.  5. Plan for one night stay--bring personal belongings. 6. Bring a current list of your medications and current insurance cards. 7. You MUST have a responsible person to drive you home. 8. Someone MUST be with you the first 24 hours after you arrive home or your discharge will be delayed. 9. Please wear clothes that are easy to get on and off and wear slip-on shoes.  Thank you for allowing us to care for  you!   -- [name]

## 2017-07-17 NOTE — Progress Notes (Signed)
Cardiology Office Note   Date:  07/17/2017   ID:  Dawn Boyer, DOB [birth year], MRN [id number]  PCP:  [name], MD  Cardiologist:  Dr. Eldridge Dace  No chief complaint on file.     History of Present Illness: Dawn Boyer is a 72 y.o. female who is here today for a follow up visit regarding left subclavian artery stenosis.  She has known history of coronary artery disease status post CABG in 1994, essential hypertension, moderate left carotid stenosis, hypothyroidism and hyperlipidemia.  There is remote history of tobacco use. She had worsening angina early this year.  She underwent cardiac catheterization in April which showed severe underlying three-vessel coronary artery disease with patent LIMA to LAD, SVG to diagonal and SVG to left PDA.  There was severe stenosis and SVG to diagonal which was treated successfully with drug-eluting stent placement. She was found to have severe heavily calcified stenosis in the left subclavian artery likely compromising the flow into the LIMA.  There was a 30 mm gradient and pressure between both arms.   Unfortunately, the patient developed a large groin hematoma with retroperitoneal bleed after the procedure.    She has been doing well with stable exertional dyspnea no recent chest pain.  No left arm discomfort.  She has mild bilateral calf discomfort with overexertion.  Past Medical History:  Diagnosis Date  . Anemia    hx  . Anxiety   . Arthritis   . Carotid arterial disease (HCC)    a. 04/2015 Carotid U/S: RICA 1-39%, LICA 40-59%, no change.  . Coronary artery disease    a. 1994 s/p CABG;  b. 03/2012 MV: No ischemia/infarct.  . Depression   . Diverticulosis   . Elevated LFTs   . Fibrocystic breast   . GERD (gastroesophageal reflux disease)   . Hemorrhoid   . Hemorrhoids   . History of hemolysis, elevated liver enzymes, and low platelet (HELLP) syndrome, currently pregnant    patient denies history  . Hyperlipidemia   . Hypertension    . Hypertensive heart disease   . Hypothyroidism   . Insomnia   . Nephrolithiasis   . PONV (postoperative nausea and vomiting)    after heart surgery    Past Surgical History:  Procedure Laterality Date  . CARDIAC CATHETERIZATION    . CHOLECYSTECTOMY  2002  . CORONARY ARTERY BYPASS GRAFT  1993  . CORONARY STENT INTERVENTION N/A 04/03/2017   Procedure: CORONARY STENT INTERVENTION;  Surgeon: Corky Crafts, MD;  Location: [facility] INVASIVE CV LAB;  Service: Cardiovascular;  Laterality: N/A;  . EVALUATION UNDER ANESTHESIA WITH HEMORRHOIDECTOMY N/A 03/14/2012   Procedure: EXAM UNDER ANESTHESIA WITH Excisional HEMORRHOIDECTOMY and Hemorrhoidal banding;  Surgeon: Atilano Ina, MD;  Location: [facility] OR;  Service: General;  Laterality: N/A;  x 2 hemorrhoids  . HEMORRHOID SURGERY    . LEFT HEART CATH AND CORS/GRAFTS ANGIOGRAPHY N/A 04/03/2017   Procedure: LEFT HEART CATH AND CORS/GRAFTS ANGIOGRAPHY;  Surgeon: Corky Crafts, MD;  Location: [facility] INVASIVE CV LAB;  Service: Cardiovascular;  Laterality: N/A;  . TUBAL LIGATION  1970  . ULTRASOUND GUIDANCE FOR VASCULAR ACCESS  04/03/2017   Procedure: Ultrasound Guidance For Vascular Access;  Surgeon: Corky Crafts, MD;  Location: [facility] INVASIVE CV LAB;  Service: Cardiovascular;;     Current Outpatient Medications  Medication Sig Dispense Refill  . ALPRAZolam (XANAX) 1 MG tablet Take 1 mg by mouth daily.     Marland Kitchen amLODipine (NORVASC) 5 MG tablet  Take 1 tablet (5 mg total) by mouth daily. 90 tablet 3  . aspirin 81 MG chewable tablet Chew 1 tablet (81 mg total) by mouth daily.    . clopidogrel (PLAVIX) 75 MG tablet Take 1 tablet (75 mg total) by mouth daily with breakfast. 30 tablet 11  . levothyroxine (SYNTHROID, LEVOTHROID) 50 MCG tablet Take 50 mcg by mouth daily before breakfast.     . metoprolol succinate (TOPROL-XL) 50 MG 24 hr tablet Take half (1/2) tablet (25 mg) by mouth in the morning and one (1) tablet (50 mg) by mouth in the evening. Take with  or immediately following a meal.    . nitroGLYCERIN (NITROSTAT) 0.4 MG SL tablet Place 0.4 mg under the tongue every 5 (five) minutes x 3 doses as needed for chest pain.    . pantoprazole (PROTONIX) 40 MG tablet Take 1 tablet (40 mg total) by mouth daily. 30 tablet 3  . quinapril (ACCUPRIL) 20 MG tablet Take 20 mg by mouth 2 (two) times daily.     . rosuvastatin (CRESTOR) 40 MG tablet Take 1 tablet (40 mg total) by mouth daily at 6 PM.    . sertraline (ZOLOFT) 100 MG tablet Take 50-100 mg by mouth See admin instructions. Take 50 mg in by mouth in am and 100 mg by mouth in the pm    . Sodium Phosphates (ENEMA RE) Place 1 each rectally 2 (two) times daily.    . Tetrahydrozoline HCl (VISINE OP) Place 1 drop into both eyes daily as needed (for dry eyes).    Marland Kitchen tiZANidine (ZANAFLEX) 4 MG tablet Take 4 mg by mouth daily.     No current facility-administered medications for this visit.     Allergies:   Tramadol    Social History:  The patient  reports that she quit smoking about 34 years ago. Her smoking use included cigarettes. She has a 12.00 pack-year smoking history. She has never used smokeless tobacco. She reports that she does not drink alcohol or use drugs.   Family History:  The patient's family history includes Diabetes in her daughter; Heart Problems in her son; Heart disease in her brother and sister.    ROS:  Please see the history of present illness.   Otherwise, review of systems are positive for none.   All other systems are reviewed and negative.    PHYSICAL EXAM: VS:  BP 112/69   Pulse 77   Ht 5\' 3"  (1.6 m)   Wt 126 lb 9.6 oz (57.4 kg)   BMI 22.43 kg/m  , BMI Body mass index is 22.43 kg/m. GEN: Well nourished, well developed, in no acute distress  HEENT: normal  Neck: no JVD.  Left carotid bruit. Cardiac: RRR; no murmurs, rubs, or gallops,no edema  Respiratory:  clear to auscultation bilaterally, normal work of breathing GI: soft, nontender, nondistended, + BS MS: no  deformity or atrophy  Skin: warm and dry, no rash Neuro:  Strength and sensation are intact Psych: euthymic mood, full affect Vascular: Right radial pulses normal.  Left radial pulses +1.   Femoral pulses are slightly diminished bilaterally.  EKG:  EKG is not ordered today.    Recent Labs: 04/04/2017: BUN 11; Creatinine, Ser 0.70; Potassium 4.5; Sodium 139 04/10/2017: Hemoglobin 8.8; Platelets 191    Lipid Panel    Component Value Date/Time   CHOL 239 (H) 02/01/2016 1351   TRIG 247 (H) 02/01/2016 1351   HDL 47 02/01/2016 1351   CHOLHDL 5.1 (H) 02/01/2016  1351   CHOLHDL 4 04/08/2014 1055   VLDL 28.4 04/08/2014 1055   LDLCALC 143 (H) 02/01/2016 1351   LDLDIRECT 90.8 01/08/2014 0828      Wt Readings from Last 3 Encounters:  07/17/17 126 lb 9.6 oz (57.4 kg)  05/15/17 129 lb (58.5 kg)  04/18/17 124 lb 12.8 oz (56.6 kg)        PAD Screen 04/10/2017  Previous PAD dx? Yes  Previous surgical procedure? No  Pain with walking? Yes  Subsides with rest? Yes  Feet/toe relief with dangling? No  Painful, non-healing ulcers? No  Extremities discolored? No      ASSESSMENT AND PLAN:  1.  Significant left subclavian artery stenosis.  I again reviewed the angiogram with patient and daughter which showed heavily calcified significant stenosis in the left subclavian artery.  This is likely compromising the blood flow to LIMA to LAD and there is an indication for revascularization.   I discussed the procedure in details as well as risks and benefits and she is agreeable to proceed. Planned access is via the right common femoral artery.  2.  Coronary artery disease involving graft coronary arteries with other forms of angina: She reports improvement in symptoms after recent stent placement to SVG to diagonal.  Continue dual antiplatelet therapy.    3.  Peripheral arterial disease: She has palpable pulses in the lower extremity .  She does complain of what seems to be mild bilateral calf  claudication.  Lower extremity arterial Doppler can be considered in the near future.  4.  Essential hypertension: Blood pressure is controlled on current medications.  5.  Hyperlipidemia: Continue treatment with rosuvastatin with a target LDL of less than 70.    Disposition:   FU with me in 1 months  Signed,  Lorine BearsMuhammad Ambrie Carte, MD  07/17/2017 11:25 AM    [facility]

## 2017-07-18 LAB — BASIC METABOLIC PANEL
BUN / CREAT RATIO: 16 (ref 12–28)
BUN: 12 mg/dL (ref 8–27)
CALCIUM: 9.5 mg/dL (ref 8.7–10.3)
CO2: 24 mmol/L (ref 20–29)
CREATININE: 0.76 mg/dL (ref 0.57–1.00)
Chloride: 103 mmol/L (ref 96–106)
GFR calc non Af Amer: 80 mL/min/{1.73_m2} (ref >59)
GFR, EST AFRICAN AMERICAN: 92 mL/min/{1.73_m2} (ref >59)
Glucose: 113 mg/dL — ABNORMAL HIGH (ref 65–99)
Potassium: 4 mmol/L (ref 3.5–5.2)
Sodium: 144 mmol/L (ref 134–144)

## 2017-07-18 LAB — CBC
HEMATOCRIT: 37.1 % (ref 34.0–46.6)
HEMOGLOBIN: 12.4 g/dL (ref 11.1–15.9)
MCH: 30.2 pg (ref 26.6–33.0)
MCHC: 33.4 g/dL (ref 31.5–35.7)
MCV: 91 fL (ref 79–97)
Platelets: 130 10*3/uL — ABNORMAL LOW (ref 150–450)
RBC: 4.1 x10E6/uL (ref 3.77–5.28)
RDW: 13.5 % (ref 12.3–15.4)
WBC: 6.9 10*3/uL (ref 3.4–10.8)

## 2017-07-20 ENCOUNTER — Telehealth: Payer: Self-pay | Admitting: *Deleted

## 2017-07-20 NOTE — Telephone Encounter (Signed)
Patient has been made aware to be at [facility] at 6:30 for her procedure on 7/24. She verbalized her understanding.

## 2017-07-20 NOTE — Telephone Encounter (Signed)
Call placed to the patient. Line was busy.   The patient will need her procedure moved up from 12:30 to 8:30 on 07/25/17

## 2017-07-25 ENCOUNTER — Encounter (HOSPITAL_COMMUNITY)
Admission: RE | Disposition: A | Discharge status: Home / Self Care | Payer: Self-pay | Source: Ambulatory Visit | Attending: Cardiovascular Disease

## 2017-07-25 ENCOUNTER — Other Ambulatory Visit: Payer: Self-pay

## 2017-07-25 ENCOUNTER — Ambulatory Visit (HOSPITAL_COMMUNITY)
Admission: RE | Admit: 2017-07-25 | Discharge: 2017-07-25 | Disposition: A | Discharge status: Home / Self Care | Payer: Medicare HMO | Source: Ambulatory Visit | Attending: Cardiovascular Disease | Admitting: Cardiovascular Disease

## 2017-07-25 DIAGNOSIS — Z87891 Personal history of nicotine dependence: Secondary | ICD-10-CM | POA: Diagnosis not present

## 2017-07-25 DIAGNOSIS — Z79899 Other long term (current) drug therapy: Secondary | ICD-10-CM | POA: Diagnosis not present

## 2017-07-25 DIAGNOSIS — Z7982 Long term (current) use of aspirin: Secondary | ICD-10-CM | POA: Diagnosis not present

## 2017-07-25 DIAGNOSIS — M1990 Unspecified osteoarthritis, unspecified site: Secondary | ICD-10-CM | POA: Diagnosis not present

## 2017-07-25 DIAGNOSIS — I6522 Occlusion and stenosis of left carotid artery: Secondary | ICD-10-CM | POA: Insufficient documentation

## 2017-07-25 DIAGNOSIS — Z833 Family history of diabetes mellitus: Secondary | ICD-10-CM | POA: Diagnosis not present

## 2017-07-25 DIAGNOSIS — E039 Hypothyroidism, unspecified: Secondary | ICD-10-CM | POA: Insufficient documentation

## 2017-07-25 DIAGNOSIS — Z9889 Other specified postprocedural states: Secondary | ICD-10-CM | POA: Insufficient documentation

## 2017-07-25 DIAGNOSIS — F329 Major depressive disorder, single episode, unspecified: Secondary | ICD-10-CM | POA: Diagnosis not present

## 2017-07-25 DIAGNOSIS — I771 Stricture of artery: Secondary | ICD-10-CM | POA: Diagnosis not present

## 2017-07-25 DIAGNOSIS — F419 Anxiety disorder, unspecified: Secondary | ICD-10-CM | POA: Diagnosis not present

## 2017-07-25 DIAGNOSIS — I2510 Atherosclerotic heart disease of native coronary artery without angina pectoris: Secondary | ICD-10-CM | POA: Diagnosis not present

## 2017-07-25 DIAGNOSIS — Z951 Presence of aortocoronary bypass graft: Secondary | ICD-10-CM | POA: Diagnosis not present

## 2017-07-25 DIAGNOSIS — K219 Gastro-esophageal reflux disease without esophagitis: Secondary | ICD-10-CM | POA: Insufficient documentation

## 2017-07-25 DIAGNOSIS — G4700 Insomnia, unspecified: Secondary | ICD-10-CM | POA: Diagnosis not present

## 2017-07-25 DIAGNOSIS — Z7901 Long term (current) use of anticoagulants: Secondary | ICD-10-CM | POA: Insufficient documentation

## 2017-07-25 DIAGNOSIS — Z888 Allergy status to other drugs, medicaments and biological substances status: Secondary | ICD-10-CM | POA: Diagnosis not present

## 2017-07-25 DIAGNOSIS — Z955 Presence of coronary angioplasty implant and graft: Secondary | ICD-10-CM | POA: Diagnosis not present

## 2017-07-25 DIAGNOSIS — I739 Peripheral vascular disease, unspecified: Secondary | ICD-10-CM | POA: Insufficient documentation

## 2017-07-25 DIAGNOSIS — Z87442 Personal history of urinary calculi: Secondary | ICD-10-CM | POA: Diagnosis not present

## 2017-07-25 DIAGNOSIS — Z9049 Acquired absence of other specified parts of digestive tract: Secondary | ICD-10-CM | POA: Diagnosis not present

## 2017-07-25 DIAGNOSIS — Z8249 Family history of ischemic heart disease and other diseases of the circulatory system: Secondary | ICD-10-CM | POA: Insufficient documentation

## 2017-07-25 DIAGNOSIS — I119 Hypertensive heart disease without heart failure: Secondary | ICD-10-CM | POA: Diagnosis not present

## 2017-07-25 DIAGNOSIS — E785 Hyperlipidemia, unspecified: Secondary | ICD-10-CM | POA: Insufficient documentation

## 2017-07-25 DIAGNOSIS — Z79890 Hormone replacement therapy (postmenopausal): Secondary | ICD-10-CM | POA: Diagnosis not present

## 2017-07-25 HISTORY — PX: UPPER EXTREMITY ANGIOGRAPHY: CATH118270

## 2017-07-25 HISTORY — PX: AORTIC ARCH ANGIOGRAPHY: CATH118224

## 2017-07-25 HISTORY — PX: PERIPHERAL VASCULAR INTERVENTION: CATH118257

## 2017-07-25 LAB — POCT ACTIVATED CLOTTING TIME
ACTIVATED CLOTTING TIME: 213 s
Activated Clotting Time: 252 seconds

## 2017-07-25 SURGERY — AORTIC ARCH ANGIOGRAPHY
Anesthesia: LOCAL

## 2017-07-25 MED ORDER — SODIUM CHLORIDE 0.9% FLUSH: 3.0000 mL | Freq: Two times a day (BID) | INTRAVENOUS | Status: DC

## 2017-07-25 MED ORDER — LIDOCAINE HCL (PF) 1 % IJ SOLN
INTRAMUSCULAR | Status: AC
  Filled 2017-07-25: qty 30

## 2017-07-25 MED ORDER — IODIXANOL 320 MG/ML IV SOLN
INTRAVENOUS | Status: DC | PRN
  Administered 2017-07-25: 120 mL via INTRAVENOUS

## 2017-07-25 MED ORDER — MIDAZOLAM HCL 2 MG/2ML IJ SOLN
INTRAMUSCULAR | Status: AC
  Filled 2017-07-25: qty 2

## 2017-07-25 MED ORDER — ACETAMINOPHEN 325 MG PO TABS: 650.0000 mg | ORAL_TABLET | ORAL | Status: DC | PRN

## 2017-07-25 MED ORDER — LIDOCAINE HCL (PF) 1 % IJ SOLN
INTRAMUSCULAR | Status: DC | PRN
  Administered 2017-07-25: 20 mL

## 2017-07-25 MED ORDER — SODIUM CHLORIDE 0.9 % IV SOLN: 250.0000 mL | INTRAVENOUS | Status: DC | PRN

## 2017-07-25 MED ORDER — ONDANSETRON HCL 4 MG/2ML IJ SOLN: 4.0000 mg | Freq: Four times a day (QID) | INTRAMUSCULAR | Status: DC | PRN

## 2017-07-25 MED ORDER — HEPARIN SODIUM (PORCINE) 1000 UNIT/ML IJ SOLN
INTRAMUSCULAR | Status: AC
  Filled 2017-07-25: qty 1

## 2017-07-25 MED ORDER — SODIUM CHLORIDE 0.9% FLUSH: 3.0000 mL | INTRAVENOUS | Status: DC | PRN

## 2017-07-25 MED ORDER — HEPARIN (PORCINE) IN NACL 1000-0.9 UT/500ML-% IV SOLN
INTRAVENOUS | Status: DC | PRN
  Administered 2017-07-25 (×2): 500 mL

## 2017-07-25 MED ORDER — LABETALOL HCL 5 MG/ML IV SOLN: 10.0000 mg | INTRAVENOUS | Status: DC | PRN

## 2017-07-25 MED ORDER — LABETALOL HCL 5 MG/ML IV SOLN
INTRAVENOUS | Status: DC | PRN
  Administered 2017-07-25: 10 mg via INTRAVENOUS

## 2017-07-25 MED ORDER — SODIUM CHLORIDE 0.9 % IV SOLN: INTRAVENOUS | Status: DC

## 2017-07-25 MED ORDER — HEPARIN (PORCINE) IN NACL 1000-0.9 UT/500ML-% IV SOLN
INTRAVENOUS | Status: AC
  Filled 2017-07-25: qty 1000

## 2017-07-25 MED ORDER — ASPIRIN 81 MG PO CHEW: 81.0000 mg | CHEWABLE_TABLET | ORAL | Status: DC

## 2017-07-25 MED ORDER — SODIUM CHLORIDE 0.9 % WEIGHT BASED INFUSION: 1.0000 mL/kg/h | INTRAVENOUS | Status: DC

## 2017-07-25 MED ORDER — FENTANYL CITRATE (PF) 100 MCG/2ML IJ SOLN
INTRAMUSCULAR | Status: AC
  Filled 2017-07-25: qty 2

## 2017-07-25 MED ORDER — HEPARIN SODIUM (PORCINE) 1000 UNIT/ML IJ SOLN
INTRAMUSCULAR | Status: DC | PRN
  Administered 2017-07-25 (×2): 2000 [IU] via INTRAVENOUS
  Administered 2017-07-25: 4000 [IU] via INTRAVENOUS

## 2017-07-25 MED ORDER — LABETALOL HCL 5 MG/ML IV SOLN
INTRAVENOUS | Status: AC
  Filled 2017-07-25: qty 4

## 2017-07-25 MED ORDER — HYDRALAZINE HCL 20 MG/ML IJ SOLN: 5.0000 mg | INTRAMUSCULAR | Status: DC | PRN

## 2017-07-25 MED ORDER — FENTANYL CITRATE (PF) 100 MCG/2ML IJ SOLN
INTRAMUSCULAR | Status: DC | PRN
  Administered 2017-07-25 (×2): 50 ug via INTRAVENOUS

## 2017-07-25 MED ORDER — MIDAZOLAM HCL 2 MG/2ML IJ SOLN
INTRAMUSCULAR | Status: DC | PRN
  Administered 2017-07-25 (×3): 1 mg via INTRAVENOUS

## 2017-07-25 MED ORDER — SODIUM CHLORIDE 0.9 % WEIGHT BASED INFUSION
3.0000 mL/kg/h | INTRAVENOUS | Status: AC
  Administered 2017-07-25: 3 mL/kg/h via INTRAVENOUS

## 2017-07-25 SURGICAL SUPPLY — 27 items
BALLN MUSTANG 6.0X40 135 (BALLOONS) ×3
BALLN STERLING OTW 6X40X135 (BALLOONS) ×3
BALLOON MUSTANG 6.0X40 135 (BALLOONS) ×2 IMPLANT
BALLOON STERLING OTW 6X40X135 (BALLOONS) ×2 IMPLANT
CATH ANGIO 5F PIGTAIL 100CM (CATHETERS) ×3 IMPLANT
CATH ANGIO 5F PIGTAIL 65CM (CATHETERS) IMPLANT
CATH INFINITI JR4 5F (CATHETERS) ×3 IMPLANT
CATH QUICKCROSS .035X135CM (MICROCATHETER) ×3 IMPLANT
CATH QUICKCROSS SUPP .035X90CM (MICROCATHETER) ×3 IMPLANT
DEVICE CLOSURE MYNXGRIP 6/7F (Vascular Products) ×3 IMPLANT
DEVICE TORQUE .025-.038 (MISCELLANEOUS) ×3 IMPLANT
GUIDEWIRE ANGLED .035X260CM (WIRE) ×3 IMPLANT
KIT ENCORE 26 ADVANTAGE (KITS) ×6 IMPLANT
KIT MICROPUNCTURE NIT STIFF (SHEATH) ×3 IMPLANT
KIT PV (KITS) ×3 IMPLANT
SHEATH PINNACLE 5F 10CM (SHEATH) ×3 IMPLANT
SHEATH PINNACLE 7F 10CM (SHEATH) ×3 IMPLANT
SHEATH PROBE COVER 6X72 (BAG) ×3 IMPLANT
SHEATH SHUTTLE 7FR (SHEATH) ×3 IMPLANT
STENT EXPRESS LD 8X27X135 (Permanent Stent) ×3 IMPLANT
STOPCOCK MORSE 400PSI 3WAY (MISCELLANEOUS) ×3 IMPLANT
SYRINGE MEDRAD AVANTA MACH 7 (SYRINGE) ×3 IMPLANT
TRANSDUCER W/STOPCOCK (MISCELLANEOUS) ×3 IMPLANT
TRAY PV CATH (CUSTOM PROCEDURE TRAY) ×3 IMPLANT
WIRE BENTSON .035X145CM (WIRE) ×3 IMPLANT
WIRE G V18X300CM (WIRE) ×3 IMPLANT
WIRE ROSEN-J .035X260CM (WIRE) ×3 IMPLANT

## 2017-07-25 NOTE — Progress Notes (Signed)
**Note Kailana Benninger-Identified via Obfuscation** Patient post aortic arch angiography. Right groin mynx'd, clean, dry, and intact. No post procedural complications noted. Patient received 6 hours of fluid hydration per order. Vital signs stable. Fall/safety precautions implemented. Patient escorted to lobby via wheelchair and accompanied by daughter and spouse.

## 2017-07-25 NOTE — Discharge Instructions (Signed)
**Note Dawn Boyer-identified via Obfuscation** Femoral Site Care °Refer to this sheet in the next few weeks. These instructions provide you with information about caring for yourself after your procedure. Your health care provider may also give you more specific instructions. Your treatment has been planned according to current medical practices, but problems sometimes occur. Call your health care provider if you have any problems or questions after your procedure. °What can I expect after the procedure? °After your procedure, it is typical to have the following: °· Bruising at the site that usually fades within 1-2 weeks. °· Blood collecting in the tissue (hematoma) that may be painful to the touch. It should usually decrease in size and tenderness within 1-2 weeks. ° °Follow these instructions at home: °· Take medicines only as directed by your health care provider. °· You may shower 24-48 hours after the procedure or as directed by your health care provider. Remove the bandage (dressing) and gently wash the site with plain soap and water. Pat the area dry with a clean towel. Do not rub the site, because this may cause bleeding. °· Do not take baths, swim, or use a hot tub until your health care provider approves. °· Check your insertion site every day for redness, swelling, or drainage. °· Do not apply powder or lotion to the site. °· Limit use of stairs to twice a day for the first 2-3 days or as directed by your health care provider. °· Do not squat for the first 2-3 days or as directed by your health care provider. °· Do not lift over 10 lb (4.5 kg) for 5 days after your procedure or as directed by your health care provider. °· Ask your health care provider when it is okay to: °? Return to work or school. °? Resume usual physical activities or sports. °? Resume sexual activity. °· Do not drive home if you are discharged the same day as the procedure. Have someone else drive you. °· You may drive 24 hours after the procedure unless otherwise instructed by  your health care provider. °· Do not operate machinery or power tools for 24 hours after the procedure or as directed by your health care provider. °· If your procedure was done as an outpatient procedure, which means that you went home the same day as your procedure, a responsible adult should be with you for the first 24 hours after you arrive home. °· Keep all follow-up visits as directed by your health care provider. This is important. °Contact a health care provider if: °· You have a fever. °· You have chills. °· You have increased bleeding from the site. Hold pressure on the site. °Get help right away if: °· You have unusual pain at the site. °· You have redness, warmth, or swelling at the site. °· You have drainage (other than a small amount of blood on the dressing) from the site. °· The site is bleeding, and the bleeding does not stop after 30 minutes of holding steady pressure on the site. °· Your leg or foot becomes pale, cool, tingly, or numb. °This information is not intended to replace advice given to you by your health care provider. Make sure you discuss any questions you have with your health care provider. °Document Released: 08/22/2013 Document Revised: 05/27/2015 Document Reviewed: 07/08/2013 °Elsevier Interactive Patient Education © 2018 [employer] ° °

## 2017-07-25 NOTE — Interval H&P Note (Signed)
History and Physical Interval Note:  07/25/2017 8:29 AM  Dawn Boyer  has presented today for surgery, with the diagnosis of pvd  The various methods of treatment have been discussed with the patient and family. After consideration of risks, benefits and other options for treatment, the patient has consented to  Procedure(s): AORTIC ARCH ANGIOGRAPHY (N/A) UPPER EXTREMITY ANGIOGRAPHY (Left) as a surgical intervention .  The patient's history has been reviewed, patient examined, no change in status, stable for surgery.  I have reviewed the patient's chart and labs.  Questions were answered to the patient's satisfaction.     Lorine BearsMuhammad Arida

## 2017-07-26 ENCOUNTER — Encounter (HOSPITAL_COMMUNITY): Payer: Self-pay | Admitting: Cardiovascular Disease

## 2017-07-30 ENCOUNTER — Telehealth: Payer: Self-pay | Admitting: *Deleted

## 2017-07-30 DIAGNOSIS — I739 Peripheral vascular disease, unspecified: Principal | ICD-10-CM

## 2017-07-30 DIAGNOSIS — I779 Disorder of arteries and arterioles, unspecified: Secondary | ICD-10-CM

## 2017-07-30 NOTE — Telephone Encounter (Signed)
Carotid doppler ordered post procedure. Message sent to scheduler.

## 2017-08-06 DIAGNOSIS — D696 Thrombocytopenia, unspecified: Secondary | ICD-10-CM | POA: Diagnosis not present

## 2017-08-06 DIAGNOSIS — E782 Mixed hyperlipidemia: Secondary | ICD-10-CM | POA: Diagnosis not present

## 2017-08-06 DIAGNOSIS — I739 Peripheral vascular disease, unspecified: Secondary | ICD-10-CM | POA: Diagnosis not present

## 2017-08-06 DIAGNOSIS — Z0000 Encounter for general adult medical examination without abnormal findings: Secondary | ICD-10-CM | POA: Diagnosis not present

## 2017-08-06 DIAGNOSIS — I10 Essential (primary) hypertension: Secondary | ICD-10-CM | POA: Diagnosis not present

## 2017-08-06 DIAGNOSIS — E039 Hypothyroidism, unspecified: Secondary | ICD-10-CM | POA: Diagnosis not present

## 2017-08-06 DIAGNOSIS — I2510 Atherosclerotic heart disease of native coronary artery without angina pectoris: Secondary | ICD-10-CM | POA: Diagnosis not present

## 2017-08-09 ENCOUNTER — Ambulatory Visit (HOSPITAL_COMMUNITY)
Admission: RE | Admit: 2017-08-09 | Discharge: 2017-08-09 | Disposition: A | Discharge status: Home / Self Care | Payer: Medicare HMO | Source: Ambulatory Visit | Attending: Cardiology | Admitting: Cardiology

## 2017-08-09 DIAGNOSIS — I779 Disorder of arteries and arterioles, unspecified: Secondary | ICD-10-CM | POA: Diagnosis not present

## 2017-08-09 DIAGNOSIS — I739 Peripheral vascular disease, unspecified: Secondary | ICD-10-CM

## 2017-08-13 ENCOUNTER — Other Ambulatory Visit: Payer: Self-pay | Admitting: *Deleted

## 2017-08-13 DIAGNOSIS — I771 Stricture of artery: Secondary | ICD-10-CM

## 2017-08-13 DIAGNOSIS — I779 Disorder of arteries and arterioles, unspecified: Secondary | ICD-10-CM

## 2017-08-13 DIAGNOSIS — I739 Peripheral vascular disease, unspecified: Principal | ICD-10-CM

## 2017-08-27 ENCOUNTER — Other Ambulatory Visit: Payer: Self-pay

## 2017-08-27 ENCOUNTER — Telehealth: Payer: Self-pay | Admitting: Cardiovascular Disease

## 2017-08-27 ENCOUNTER — Emergency Department
Admission: EM | Admit: 2017-08-27 | Discharge: 2017-08-27 | Disposition: A | Discharge status: Home / Self Care | Payer: Medicare HMO | Attending: Emergency Medicine | Admitting: Emergency Medicine

## 2017-08-27 ENCOUNTER — Emergency Department: Payer: Medicare HMO

## 2017-08-27 ENCOUNTER — Encounter: Payer: Self-pay | Admitting: Emergency Medicine

## 2017-08-27 DIAGNOSIS — N8500 Endometrial hyperplasia, unspecified: Secondary | ICD-10-CM | POA: Diagnosis not present

## 2017-08-27 DIAGNOSIS — R109 Unspecified abdominal pain: Secondary | ICD-10-CM | POA: Insufficient documentation

## 2017-08-27 DIAGNOSIS — R1031 Right lower quadrant pain: Secondary | ICD-10-CM | POA: Diagnosis not present

## 2017-08-27 DIAGNOSIS — Z951 Presence of aortocoronary bypass graft: Secondary | ICD-10-CM | POA: Insufficient documentation

## 2017-08-27 DIAGNOSIS — Z79899 Other long term (current) drug therapy: Secondary | ICD-10-CM | POA: Diagnosis not present

## 2017-08-27 DIAGNOSIS — Z7982 Long term (current) use of aspirin: Secondary | ICD-10-CM | POA: Insufficient documentation

## 2017-08-27 DIAGNOSIS — M79605 Pain in left leg: Secondary | ICD-10-CM | POA: Diagnosis not present

## 2017-08-27 DIAGNOSIS — I10 Essential (primary) hypertension: Secondary | ICD-10-CM | POA: Insufficient documentation

## 2017-08-27 DIAGNOSIS — I2510 Atherosclerotic heart disease of native coronary artery without angina pectoris: Secondary | ICD-10-CM | POA: Insufficient documentation

## 2017-08-27 DIAGNOSIS — I745 Embolism and thrombosis of iliac artery: Secondary | ICD-10-CM | POA: Diagnosis not present

## 2017-08-27 DIAGNOSIS — Z7901 Long term (current) use of anticoagulants: Secondary | ICD-10-CM | POA: Insufficient documentation

## 2017-08-27 DIAGNOSIS — Z87891 Personal history of nicotine dependence: Secondary | ICD-10-CM | POA: Diagnosis not present

## 2017-08-27 DIAGNOSIS — R9389 Abnormal findings on diagnostic imaging of other specified body structures: Secondary | ICD-10-CM

## 2017-08-27 DIAGNOSIS — M79604 Pain in right leg: Secondary | ICD-10-CM | POA: Diagnosis not present

## 2017-08-27 DIAGNOSIS — M79661 Pain in right lower leg: Secondary | ICD-10-CM | POA: Diagnosis not present

## 2017-08-27 LAB — CBC WITH DIFFERENTIAL/PLATELET
BASOS ABS: 0 10*3/uL (ref 0–0.1)
BASOS PCT: 0 %
EOS ABS: 0.2 10*3/uL (ref 0–0.7)
EOS PCT: 3 %
HCT: 31.2 % — ABNORMAL LOW (ref 35.0–47.0)
Hemoglobin: 11 g/dL — ABNORMAL LOW (ref 12.0–16.0)
Lymphocytes Relative: 18 %
Lymphs Abs: 1.3 10*3/uL (ref 1.0–3.6)
MCH: 32.2 pg (ref 26.0–34.0)
MCHC: 35.4 g/dL (ref 32.0–36.0)
MCV: 91 fL (ref 80.0–100.0)
MONO ABS: 0.5 10*3/uL (ref 0.2–0.9)
Monocytes Relative: 7 %
Neutro Abs: 4.9 10*3/uL (ref 1.4–6.5)
Neutrophils Relative %: 72 %
PLATELETS: 116 10*3/uL — AB (ref 150–440)
RBC: 3.43 MIL/uL — ABNORMAL LOW (ref 3.80–5.20)
RDW: 13.4 % (ref 11.5–14.5)
WBC: 6.9 10*3/uL (ref 3.6–11.0)

## 2017-08-27 LAB — COMPREHENSIVE METABOLIC PANEL
ALBUMIN: 4 g/dL (ref 3.5–5.0)
ALT: 28 U/L (ref 0–44)
ANION GAP: 7 (ref 5–15)
AST: 37 U/L (ref 15–41)
Alkaline Phosphatase: 96 U/L (ref 38–126)
BILIRUBIN TOTAL: 0.5 mg/dL (ref 0.3–1.2)
BUN: 25 mg/dL — ABNORMAL HIGH (ref 8–23)
CO2: 26 mmol/L (ref 22–32)
Calcium: 8.9 mg/dL (ref 8.9–10.3)
Chloride: 107 mmol/L (ref 98–111)
Creatinine, Ser: 0.75 mg/dL (ref 0.44–1.00)
GFR calc Af Amer: >60 mL/min (ref >60)
GFR calc non Af Amer: >60 mL/min (ref >60)
Glucose, Bld: 116 mg/dL — ABNORMAL HIGH (ref 70–99)
POTASSIUM: 3.9 mmol/L (ref 3.5–5.1)
SODIUM: 140 mmol/L (ref 135–145)
Total Protein: 7 g/dL (ref 6.5–8.1)

## 2017-08-27 LAB — LIPASE, BLOOD: LIPASE: 62 U/L — AB (ref 11–51)

## 2017-08-27 LAB — TROPONIN I

## 2017-08-27 MED ORDER — SODIUM CHLORIDE 0.9 % IV BOLUS
1000.0000 mL | Freq: Once | INTRAVENOUS | Status: AC
  Administered 2017-08-27: 1000 mL via INTRAVENOUS

## 2017-08-27 MED ORDER — IOPAMIDOL (ISOVUE-370) INJECTION 76%
100.0000 mL | Freq: Once | INTRAVENOUS | Status: AC | PRN
  Administered 2017-08-27: 100 mL via INTRAVENOUS

## 2017-08-27 NOTE — Telephone Encounter (Signed)
Patient daughter calling Wyatt Mageabitha calling stating she would like a call back just to please let her know that it is okay for patient to being in and out of conscious   Would also like to know if they did find any clots or anything of this sort in patient's leg.  Would like call back on this.

## 2017-08-27 NOTE — ED Triage Notes (Signed)
R groin pain since cardiac cath 8/24. States pain getting worse. Ankles noted equally pink warm and dry.

## 2017-08-27 NOTE — ED Notes (Signed)
Patient transported to CT at this time.  Will continue to monitor. 

## 2017-08-27 NOTE — ED Provider Notes (Addendum)
[facility] Emergency Department Provider Note ____________________________________________   First MD Initiated Contact with Patient 08/27/17 0701     (approximate)  I have reviewed the triage vital signs and the nursing notes.   HISTORY  Chief Complaint Groin Pain  HPI Dawn Boyer is a 72 y.o. female a history of recent catheterization in late July as well as April of this year.  She has had multiple stents including a subclavian repair.  She says that she has had right groin and right side pain ever since the procedure in late July that is been especially painful over the past 2 weeks.  Says the pain is to her right groin and right hip and radiating up her right side into her right arm.  She says the pain is an 8 out of 10 right now but is improved after taking 2 tramadol this morning.  She is also taken her aspirin and Plavix this morning.  Does not report any chest pain or shortness of breath at this time.  Does not report any urinary symptoms.  Says that she came in today because the pain was just unbearable.  Has seen Dr. Kirke Corin in the past.   Past Medical History:  Diagnosis Date  . Anemia    hx  . Anxiety   . Arthritis   . Carotid arterial disease (HCC)    a. 04/2015 Carotid U/S: RICA 1-39%, LICA 40-59%, no change.  . Coronary artery disease    a. 1994 s/p CABG;  b. 03/2012 MV: No ischemia/infarct.  . Depression   . Diverticulosis   . Elevated LFTs   . Fibrocystic breast   . GERD (gastroesophageal reflux disease)   . Hemorrhoid   . Hemorrhoids   . History of hemolysis, elevated liver enzymes, and low platelet (HELLP) syndrome, currently pregnant    patient denies history  . Hyperlipidemia   . Hypertension   . Hypertensive heart disease   . Hypothyroidism   . Insomnia   . Nephrolithiasis   . PONV (postoperative nausea and vomiting)    after heart surgery    Patient Active Problem List   Diagnosis Date Noted  . Angina pectoris (HCC)  04/03/2017  . Hypertensive heart disease   . Coronary artery disease   . Carotid arterial disease (HCC)   . Hypothyroidism   . Carotid disease, bilateral (HCC) 05/07/2015  . Sleep disturbance 01/28/2015  . Abdominal pain 01/28/2015  . Hyperlipidemia, mixed 10/02/2013  . Essential hypertension, benign 10/02/2013  . Internal and external bleeding hemorrhoids 02/29/2012  . IRON DEFICIENCY 07/27/2008  . HYPOTHYROIDISM 07/02/2008  . DEPRESSION 07/02/2008  . RECTAL BLEEDING 07/02/2008  . NEPHROLITHIASIS, HX OF 07/02/2008  . Hyperlipidemia 06/29/2008  . Coronary atherosclerosis 06/29/2008  . HEMORRHOIDS 06/29/2008  . CONSTIPATION, CHRONIC 06/29/2008  . MELANOSIS COLI 06/29/2008  . COLONIC POLYPS, HX OF 06/29/2008    Past Surgical History:  Procedure Laterality Date  . AORTIC ARCH ANGIOGRAPHY N/A 07/25/2017   Procedure: AORTIC ARCH ANGIOGRAPHY;  Surgeon: Iran Ouch, MD;  Location: [facility] INVASIVE CV LAB;  Service: Cardiovascular;  Laterality: N/A;  . CARDIAC CATHETERIZATION    . CHOLECYSTECTOMY  2002  . CORONARY ARTERY BYPASS GRAFT  1993  . CORONARY STENT INTERVENTION N/A 04/03/2017   Procedure: CORONARY STENT INTERVENTION;  Surgeon: Corky Crafts, MD;  Location: [facility] INVASIVE CV LAB;  Service: Cardiovascular;  Laterality: N/A;  . EVALUATION UNDER ANESTHESIA WITH HEMORRHOIDECTOMY N/A 03/14/2012   Procedure: EXAM UNDER ANESTHESIA WITH Excisional  HEMORRHOIDECTOMY and Hemorrhoidal banding;  Surgeon: Atilano Ina, MD;  Location: [facility] OR;  Service: General;  Laterality: N/A;  x 2 hemorrhoids  . HEMORRHOID SURGERY    . LEFT HEART CATH AND CORS/GRAFTS ANGIOGRAPHY N/A 04/03/2017   Procedure: LEFT HEART CATH AND CORS/GRAFTS ANGIOGRAPHY;  Surgeon: Corky Crafts, MD;  Location: [facility] INVASIVE CV LAB;  Service: Cardiovascular;  Laterality: N/A;  . PERIPHERAL VASCULAR INTERVENTION Left 07/25/2017   Procedure: PERIPHERAL VASCULAR INTERVENTION;  Surgeon: Iran Ouch, MD;  Location: [facility]  INVASIVE CV LAB;  Service: Cardiovascular;  Laterality: Left;  Subclavian  . TUBAL LIGATION  1970  . ULTRASOUND GUIDANCE FOR VASCULAR ACCESS  04/03/2017   Procedure: Ultrasound Guidance For Vascular Access;  Surgeon: Corky Crafts, MD;  Location: [facility] INVASIVE CV LAB;  Service: Cardiovascular;;  . UPPER EXTREMITY ANGIOGRAPHY Left 07/25/2017   Procedure: UPPER EXTREMITY ANGIOGRAPHY;  Surgeon: Iran Ouch, MD;  Location: [facility] INVASIVE CV LAB;  Service: Cardiovascular;  Laterality: Left;    Prior to Admission medications   Medication Sig Start Date End Date Taking? Authorizing Provider  ALPRAZolam Prudy Feeler) 1 MG tablet Take 1 mg by mouth at bedtime.  12/09/13   [provider]  amLODipine (NORVASC) 5 MG tablet Take 1 tablet (5 mg total) by mouth daily. 06/03/15   Creig Hines, NP  aspirin 81 MG chewable tablet Chew 1 tablet (81 mg total) by mouth daily. 04/06/17   Allayne Butcher, PA-C  clopidogrel (PLAVIX) 75 MG tablet Take 1 tablet (75 mg total) by mouth daily with breakfast. 04/06/17   Robbie Lis M, PA-C  FeFum-FePo-FA-B Cmp-C-Zn-Mn-Cu (SE-TAN PLUS) 162-115.2-1 MG CAPS Take 1 tablet by mouth daily.    [provider]  levothyroxine (SYNTHROID, LEVOTHROID) 50 MCG tablet Take 50 mcg by mouth daily before breakfast.     [provider]  metoprolol succinate (TOPROL-XL) 50 MG 24 hr tablet Take half (1/2) tablet (25 mg) by mouth in the morning and one (1) tablet (50 mg) by mouth in the evening. Take with or immediately following a meal.    [provider]  nitroGLYCERIN (NITROSTAT) 0.4 MG SL tablet Place 0.4 mg under the tongue every 5 (five) minutes x 3 doses as needed for chest pain.    [provider]  pantoprazole (PROTONIX) 40 MG tablet Take 1 tablet (40 mg total) by mouth daily. Patient taking differently: Take 40 mg by mouth at bedtime.  04/05/17 04/05/18  Robbie Lis M, PA-C  quinapril (ACCUPRIL) 20 MG tablet Take 20 mg by  mouth 2 (two) times daily.     [provider]  rosuvastatin (CRESTOR) 40 MG tablet Take 1 tablet (40 mg total) by mouth daily at 6 PM. 04/05/17   Allayne Butcher, PA-C  sertraline (ZOLOFT) 100 MG tablet Take 50-100 mg by mouth See admin instructions. Take 50 mg in by mouth in am and 100 mg by mouth in the pm 01/19/17   [provider]  Sodium Phosphates (ENEMA RE) Place 1 each rectally 2 (two) times daily.    [provider]  Tetrahydrozoline HCl (VISINE OP) Place 1 drop into both eyes daily as needed (for dry eyes).    [provider]  tiZANidine (ZANAFLEX) 4 MG tablet Take 4 mg by mouth at bedtime as needed for muscle spasms.  02/20/17   [provider]    Allergies Patient has no known allergies.  Family History  Problem Relation Age of Onset  . Heart disease Sister   .  Heart disease Brother   . Heart Problems Son        Pt states son had a "skipped heart beats"  . Diabetes Daughter   . Colon cancer Neg Hx   . Esophageal cancer Neg Hx   . Rectal cancer Neg Hx   . Stomach cancer Neg Hx     Social History Social History   Tobacco Use  . Smoking status: Former Smoker    Packs/day: 1.00    Years: 12.00    Pack years: 12.00    Types: Cigarettes    Last attempt to quit: 11/21/1982    Years since quitting: 34.7  . Smokeless tobacco: Never Used  Substance Use Topics  . Alcohol use: No    Alcohol/week: 0.0 standard drinks  . Drug use: No    Review of Systems  Constitutional: No fever/chills Eyes: No visual changes. ENT: No sore throat. Cardiovascular: Denies chest pain. Respiratory: Denies shortness of breath. Gastrointestinal: No abdominal pain.  No nausea, no vomiting.  No diarrhea.  No constipation. Genitourinary: Negative for dysuria. Musculoskeletal: Negative for back pain. Skin: Negative for rash. Neurological: Negative for headaches, focal weakness or  numbness.   ____________________________________________   PHYSICAL EXAM:  VITAL SIGNS: ED Triage Vitals  Enc Vitals Group     BP 08/27/17 0656 (!) 120/41     Pulse Rate 08/27/17 0656 86     Resp 08/27/17 0656 18     Temp 08/27/17 0656 98.8 F (37.1 C)     Temp Source 08/27/17 0656 Oral     SpO2 08/27/17 0656 95 %     Weight 08/27/17 0657 125 lb (56.7 kg)     Height 08/27/17 0657 5\' 4"  (1.626 m)     Head Circumference --      Peak Flow --      Pain Score 08/27/17 0657 10     Pain Loc --      Pain Edu? --      Excl. in GC? --     Constitutional: Alert and oriented. Well appearing and in no acute distress. Eyes: Conjunctivae are normal.  Head: Atraumatic. Nose: No congestion/rhinnorhea. Mouth/Throat: Mucous membranes are moist.  Neck: No stridor.   Cardiovascular: Normal rate, regular rhythm. Grossly normal heart sounds.  Good peripheral circulation with equal and bilateral radial as well as dorsalis pedis pulses.  Right groin without tenderness over the artery.  There is no mass palpated.  No pulsatile mass.  No bruit auscultated.  Respiratory: Normal respiratory effort.  No retractions. Lungs CTAB. Gastrointestinal: Soft and nontender. No distention. No CVA tenderness. Musculoskeletal: Mild tenderness to palpation of lateral right hip and flank.  No tenderness to the right upper extremity.  No edema. Neurologic:  Normal speech and language. No gross focal neurologic deficits are appreciated. Skin:  Skin is warm, dry and intact. No rash noted. Psychiatric: Mood and affect are normal. Speech and behavior are normal.  ____________________________________________   LABS (all labs ordered are listed, but only abnormal results are displayed)  Labs Reviewed  CBC WITH DIFFERENTIAL/PLATELET - Abnormal; Notable for the following components:      Result Value   RBC 3.43 (*)    Hemoglobin 11.0 (*)    HCT 31.2 (*)    Platelets 116 (*)    All other components within normal  limits  LIPASE, BLOOD - Abnormal; Notable for the following components:   Lipase 62 (*)    All other components within normal limits  COMPREHENSIVE METABOLIC PANEL -  Abnormal; Notable for the following components:   Glucose, Bld 116 (*)    BUN 25 (*)    All other components within normal limits  TROPONIN I  URINALYSIS, COMPLETE (UACMP) WITH MICROSCOPIC   ____________________________________________  EKG  ED ECG REPORT I, Arelia Longestavid M Cheyanne Lamison, the attending physician, personally viewed and interpreted this ECG.   Date: 08/27/2017  EKG Time: 0850  Rate: 107  Rhythm: sinus tachycardia with PVC x1  Axis: Normal  Intervals:none  ST&T Change: T wave inversions in 1, 2, aVF, V3 through V6 with minimal and diffuse depressions to the ST segments except for mild elevation in aVR.  No significant change from previous.  ____________________________________________  RADIOLOGY  No acute vascular findings on CT.  Endometrial thickening approaching 2 cm.  Likely requiring follow-up sonography.  Negative DVT ultrasound of the right lower extremity. ____________________________________________   PROCEDURES  Procedure(s) performed:   Procedures  Critical Care performed:   ____________________________________________   INITIAL IMPRESSION / ASSESSMENT AND PLAN / ED COURSE  Pertinent labs & imaging results that were available during my care of the patient were reviewed by me and considered in my medical decision making (see chart for details).  DDX: Aneurysm, dissection, retroperitoneal hemorrhage, anemia, muscular skeletal pain, hip pain, ACS As part of my medical decision making, I reviewed the following data within the electronic MEDICAL RECORD NUMBERViewed chart from prior admissions  ----------------------------------------- 8:10 AM on 08/27/2017 -----------------------------------------  I discussed the case with Dr. Kirke CorinArida who states the patient had a large retroperitoneal hemorrhage  this past April and recommends checking a CAT scan.  Will check labs including hemoglobin.  If reassuring the patient to follow-up with Dr.  Kirke CorinArida in the office.  ----------------------------------------- 12:41 PM on 08/27/2017 -----------------------------------------  Patient with a negative DVT ultrasound of the right lower extremity.  Also without any acute vascular finding on the CT angiography abdomen and pelvis.  Patient aware of the endometrial thickening.  Slightly elevated lipase but this does not correlate with the patient's pain.  She will follow-up with her primary care doctor regarding the endometrial thickening and with Dr. Bascom Levelseeder tomorrow at her scheduled appointment.  Symptoms have been going on for weeks without acute findings today.  I do not believe that the patient will require further emergency room testing or treatment and that this may be safely evaluated as an outpatient. ____________________________________________   FINAL CLINICAL IMPRESSION(S) / ED DIAGNOSES  Endometrial thickening.  Right flank and lower extremity pain.   NEW MEDICATIONS STARTED DURING THIS VISIT:  New Prescriptions   No medications on file     Note:  This document was prepared using Dragon voice recognition software and may include unintentional dictation errors.     Myrna BlazerSchaevitz, Juliona Vales Matthew, MD 08/27/17 1243  Patient aware that the thickened endometrial stripe can be an early indication of cancer and aware the importance of the need to follow-up with her primary care doctor to schedule an ultrasound.   Myrna BlazerSchaevitz, Jearlene Bridwell Matthew, MD 08/27/17 [phone]

## 2017-08-27 NOTE — Telephone Encounter (Signed)
Patient's daughter was called back. She was already aware of the results and had spoken to her mother. She stated that the patient was sleeping but did not state that the patient was in and out of consciousness. Her father is with her and will keep an eye on her. She was calling to verify the appointment for tomorrow with Dr. Kirke CorinArida and to say thank you for being available for her. She has been advised to call back if anything further is needed.

## 2017-08-27 NOTE — Telephone Encounter (Signed)
I called and spoke with the patient's daughter, Wyatt Mageabitha. She is concerned as her mother has been having right lower leg and arm pain prior to today.  [facility]. Wyatt Mageabitha is not currently with the patient, but is concerned they will not run all the needed tests. She understands from her father that the ER physician has reached out to Dr. Kirke CorinArida.   I have advised Tabitha (ok per DPR) of what I can see in the ER physicians note at this time. According to the note, they have spoken with Dr. Kirke CorinArida and he recommended a CT scan be done due to her history of retroperitoneal bleed.  Wyatt Mageabitha is concerned that no testing will be done to look for a clot in her leg.  I have advised her at this time, that I do not see any testing for this ordered for the patient, however, Wyatt Mageabitha is also aware that the ER note is incomplete at this time.   I have advised her that she/ her father may ask the ER physician if a lower extremity venous doppler is warranted.  The patient is also scheduled to see Dr. Kirke CorinArida at the [facility] office tomorrow.  I have recommended that she keep that follow up, unless Dr. Kirke CorinArida sees her in the hospital today and he advises otherwise.   Tabitha voices understanding of the above and agreeable.

## 2017-08-27 NOTE — Telephone Encounter (Signed)
Pt daughter called, states pt his currently [facility]. States her leg that Dr. Kirke CorinArida placed a stent is giving her a lot of pain. States that pt took 2 of her husbands pain medication. States she now cannot feel any pain in her leg. Please call to discuss. Pt daughter states the ER is awaiting a call from Dr. Kirke CorinArida .

## 2017-08-28 ENCOUNTER — Encounter: Payer: Self-pay | Admitting: Cardiovascular Disease

## 2017-08-28 ENCOUNTER — Ambulatory Visit: Payer: Medicare HMO | Admitting: Cardiovascular Disease

## 2017-08-28 VITALS — BP 130/62 | HR 64 | Ht 63.0 in | Wt 127.0 lb

## 2017-08-28 DIAGNOSIS — I739 Peripheral vascular disease, unspecified: Secondary | ICD-10-CM

## 2017-08-28 DIAGNOSIS — I10 Essential (primary) hypertension: Secondary | ICD-10-CM | POA: Diagnosis not present

## 2017-08-28 DIAGNOSIS — I771 Stricture of artery: Secondary | ICD-10-CM | POA: Diagnosis not present

## 2017-08-28 DIAGNOSIS — I2510 Atherosclerotic heart disease of native coronary artery without angina pectoris: Secondary | ICD-10-CM

## 2017-08-28 DIAGNOSIS — E782 Mixed hyperlipidemia: Secondary | ICD-10-CM

## 2017-08-28 NOTE — Patient Instructions (Signed)
Medication Instructions:  Your physician recommends that you continue on your current medications as directed. Please refer to the Current Medication list given to you today.   Labwork: none  Testing/Procedures: Your physician has requested that you have an aorta/iliac duplex. During this test, an ultrasound is used to evaluate blood flow to the aorta and iliac arteries. Allow one hour for this exam. Do not eat after midnight the day before and avoid carbonated beverages.   Your physician has requested that you have an ankle brachial index (ABI). During this test an ultrasound and blood pressure cuff are used to evaluate the arteries that supply the arms and legs with blood. Allow thirty minutes for this exam. There are no restrictions or special instructions.   Follow-Up: Your physician wants you to follow-up in: 6 months with Dr. Kirke CorinArida. You will receive a reminder letter in the mail two months in advance. If you don't receive a letter, please call our office to schedule the follow-up appointment.   Any Other Special Instructions Will Be Listed Below (If Applicable).     If you need a refill on your cardiac medications before your next appointment, please call your pharmacy.

## 2017-08-28 NOTE — Progress Notes (Signed)
Cardiology Office Note   Date:  08/28/2017   ID:  Verbie, Babic 1945-05-23, MRN [id number]  PCP:  [name], MD  Cardiologist:  Dr. Eldridge Dace  No chief complaint on file.     History of Present Illness: Dawn Boyer is a 72 y.o. female who is here today for a follow up visit regarding left subclavian artery stenosis.  She has known history of coronary artery disease status post CABG in 1994, essential hypertension, moderate left carotid stenosis, hypothyroidism and hyperlipidemia.  There is remote history of tobacco use. She had worsening angina early this year.  She underwent cardiac catheterization in April which showed severe underlying three-vessel coronary artery disease with patent LIMA to LAD, SVG to diagonal and SVG to left PDA.  There was severe stenosis and SVG to diagonal which was treated successfully with drug-eluting stent placement. She was found to have severe heavily calcified stenosis in the left subclavian artery likely compromising the flow into the LIMA.  There was a 30 mm gradient and pressure between both arms.   Unfortunately, the patient developed a large groin hematoma with retroperitoneal bleed after the procedure.    I proceeded with arch angiography and left subclavian artery stenting last month without complications.  Postprocedure carotid Doppler showed moderate left carotid stenosis and patent left subclavian artery stent with antegrade flow in the vertebral arteries.  She had sudden onset of right leg pain affecting mostly the thigh area which was sudden and required taking tramadol and oxycodone.  She went to the emergency room at [facility].  She had CTA of the abdomen and pelvis which showed moderate iliac artery disease with no other acute findings.  There was an incidental finding of significant endometrial thickening.  Right lower extremity venous Doppler showed no evidence of DVT.  Past Medical History:  Diagnosis Date  . Anemia    hx  . Anxiety    . Arthritis   . Carotid arterial disease (HCC)    a. 04/2015 Carotid U/S: RICA 1-39%, LICA 40-59%, no change.  . Coronary artery disease    a. 1994 s/p CABG;  b. 03/2012 MV: No ischemia/infarct.  . Depression   . Diverticulosis   . Elevated LFTs   . Fibrocystic breast   . GERD (gastroesophageal reflux disease)   . Hemorrhoid   . Hemorrhoids   . History of hemolysis, elevated liver enzymes, and low platelet (HELLP) syndrome, currently pregnant    patient denies history  . Hyperlipidemia   . Hypertension   . Hypertensive heart disease   . Hypothyroidism   . Insomnia   . Nephrolithiasis   . PONV (postoperative nausea and vomiting)    after heart surgery    Past Surgical History:  Procedure Laterality Date  . AORTIC ARCH ANGIOGRAPHY N/A 07/25/2017   Procedure: AORTIC ARCH ANGIOGRAPHY;  Surgeon: Iran Ouch, MD;  [facility]: [facility] INVASIVE CV LAB;  Service: Cardiovascular;  Laterality: N/A;  . CARDIAC CATHETERIZATION    . CHOLECYSTECTOMY  2002  . CORONARY ARTERY BYPASS GRAFT  1993  . CORONARY STENT INTERVENTION N/A 04/03/2017   Procedure: CORONARY STENT INTERVENTION;  Surgeon: Corky Crafts, MD;  Location: [facility] INVASIVE CV LAB;  Service: Cardiovascular;  Laterality: N/A;  . EVALUATION UNDER ANESTHESIA WITH HEMORRHOIDECTOMY N/A 03/14/2012   Procedure: EXAM UNDER ANESTHESIA WITH Excisional HEMORRHOIDECTOMY and Hemorrhoidal banding;  Surgeon: Atilano Ina, MD;  Location: [facility] OR;  Service: General;  Laterality: N/A;  x 2 hemorrhoids  . HEMORRHOID  SURGERY    . LEFT HEART CATH AND CORS/GRAFTS ANGIOGRAPHY N/A 04/03/2017   Procedure: LEFT HEART CATH AND CORS/GRAFTS ANGIOGRAPHY;  Surgeon: Corky Crafts, MD;  Location: [facility] INVASIVE CV LAB;  Service: Cardiovascular;  Laterality: N/A;  . PERIPHERAL VASCULAR INTERVENTION Left 07/25/2017   Procedure: PERIPHERAL VASCULAR INTERVENTION;  Surgeon: Iran Ouch, MD;  Location: [facility] INVASIVE CV LAB;  Service: Cardiovascular;  Laterality: Left;   Subclavian  . TUBAL LIGATION  1970  . ULTRASOUND GUIDANCE FOR VASCULAR ACCESS  04/03/2017   Procedure: Ultrasound Guidance For Vascular Access;  Surgeon: Corky Crafts, MD;  Location: [facility] INVASIVE CV LAB;  Service: Cardiovascular;;  . UPPER EXTREMITY ANGIOGRAPHY Left 07/25/2017   Procedure: UPPER EXTREMITY ANGIOGRAPHY;  Surgeon: Iran Ouch, MD;  Location: [facility] INVASIVE CV LAB;  Service: Cardiovascular;  Laterality: Left;     Current Outpatient Medications  Medication Sig Dispense Refill  . ALPRAZolam (XANAX) 1 MG tablet Take 1 mg by mouth at bedtime.     Marland Kitchen amLODipine (NORVASC) 5 MG tablet Take 1 tablet (5 mg total) by mouth daily. 90 tablet 3  . aspirin 81 MG chewable tablet Chew 1 tablet (81 mg total) by mouth daily.    . clopidogrel (PLAVIX) 75 MG tablet Take 1 tablet (75 mg total) by mouth daily with breakfast. 30 tablet 11  . levothyroxine (SYNTHROID, LEVOTHROID) 50 MCG tablet Take 50 mcg by mouth daily before breakfast.     . metoprolol succinate (TOPROL-XL) 50 MG 24 hr tablet Take half (1/2) tablet (25 mg) by mouth in the morning and one (1) tablet (50 mg) by mouth in the evening. Take with or immediately following a meal.    . nitroGLYCERIN (NITROSTAT) 0.4 MG SL tablet Place 0.4 mg under the tongue every 5 (five) minutes x 3 doses as needed for chest pain.    . pantoprazole (PROTONIX) 40 MG tablet Take 1 tablet (40 mg total) by mouth daily. (Patient taking differently: Take 40 mg by mouth at bedtime. ) 30 tablet 3  . quinapril (ACCUPRIL) 20 MG tablet Take 20 mg by mouth 2 (two) times daily.     . rosuvastatin (CRESTOR) 40 MG tablet Take 1 tablet (40 mg total) by mouth daily at 6 PM.    . sertraline (ZOLOFT) 100 MG tablet Take 50-100 mg by mouth See admin instructions. Take 50 mg in by mouth in am and 100 mg by mouth in the pm    . Sodium Phosphates (ENEMA RE) Place 1 each rectally 2 (two) times daily.    . Tetrahydrozoline HCl (VISINE OP) Place 1 drop into both eyes daily as  needed (for dry eyes).    Marland Kitchen tiZANidine (ZANAFLEX) 4 MG tablet Take 4 mg by mouth at bedtime as needed for muscle spasms.      No current facility-administered medications for this visit.     Allergies:   Patient has no known allergies.    Social History:  The patient  reports that she quit smoking about 34 years ago. Her smoking use included cigarettes. She has a 12.00 pack-year smoking history. She has never used smokeless tobacco. She reports that she does not drink alcohol or use drugs.   Family History:  The patient's family history includes Diabetes in her daughter; Heart Problems in her son; Heart disease in her brother and sister.    ROS:  Please see the history of present illness.   Otherwise, review of systems are positive for none.   All other  systems are reviewed and negative.    PHYSICAL EXAM: VS:  BP 130/62 (BP Location: Left Arm, Patient Position: Sitting, Cuff Size: Normal)   Pulse 64   Ht 5\' 3"  (1.6 m)   Wt 127 lb (57.6 kg)   BMI 22.50 kg/m  , BMI Body mass index is 22.5 kg/m. GEN: Well nourished, well developed, in no acute distress  HEENT: normal  Neck: no JVD.  Left carotid bruit. Cardiac: RRR; no murmurs, rubs, or gallops,no edema  Respiratory:  clear to auscultation bilaterally, normal work of breathing GI: soft, nontender, nondistended, + BS MS: no deformity or atrophy  Skin: warm and dry, no rash Neuro:  Strength and sensation are intact Psych: euthymic mood, full affect Vascular: Right radial pulses normal.  Left radial pulses normal Femoral pulses are slightly diminished bilaterally.  No groin hematoma  EKG:  EKG is not ordered today.    Recent Labs: 08/27/2017: ALT 28; BUN 25; Creatinine, Ser 0.75; Hemoglobin 11.0; Platelets 116; Potassium 3.9; Sodium 140    Lipid Panel    Component Value Date/Time   CHOL 239 (H) 02/01/2016 1351   TRIG 247 (H) 02/01/2016 1351   HDL 47 02/01/2016 1351   CHOLHDL 5.1 (H) 02/01/2016 1351   CHOLHDL 4 04/08/2014  1055   VLDL 28.4 04/08/2014 1055   LDLCALC 143 (H) 02/01/2016 1351   LDLDIRECT 90.8 01/08/2014 0828      Wt Readings from Last 3 Encounters:  08/28/17 127 lb (57.6 kg)  08/27/17 125 lb (56.7 kg)  07/25/17 126 lb (57.2 kg)        PAD Screen 04/10/2017  Previous PAD dx? Yes  Previous surgical procedure? No  Pain with walking? Yes  Subsides with rest? Yes  Feet/toe relief with dangling? No  Painful, non-healing ulcers? No  Extremities discolored? No      ASSESSMENT AND PLAN:  1.  Significant left subclavian artery stenosis.  Status post successful stent placement last year without complications.  She is already on dual antiplatelet therapy.  Repeat carotid Doppler in 1 year.  2.  Coronary artery disease involving graft coronary arteries with other forms of angina: She reports improvement in symptoms after recent stent placement to SVG to diagonal.  Continue dual antiplatelet therapy.    3.  Peripheral arterial disease: She does have what seems to be at least moderate bilateral iliac disease.  This does not really explain her severe right thigh discomfort which might be neuropathic or musculoskeletal.  Nonetheless, I am going to obtain an aortoiliac duplex and ABI.  4.  Essential hypertension: Blood pressure is controlled on current medications.  5.  Hyperlipidemia: Continue treatment with rosuvastatin with a target LDL of less than 70.  6.  Endometrial thickening noted on CT scan: This seems to be an incidental finding and I advised her to follow-up with her primary care physician for further work-up.   Disposition:   FU with me in 6 months  Signed,  Lorine BearsMuhammad Arida, MD  08/28/2017 10:15 AM     [facility]

## 2017-09-01 ENCOUNTER — Other Ambulatory Visit: Payer: Self-pay | Admitting: Family Medicine

## 2017-09-01 DIAGNOSIS — R9389 Abnormal findings on diagnostic imaging of other specified body structures: Secondary | ICD-10-CM

## 2017-09-04 ENCOUNTER — Ambulatory Visit (HOSPITAL_BASED_OUTPATIENT_CLINIC_OR_DEPARTMENT_OTHER)
Admission: RE | Admit: 2017-09-04 | Discharge: 2017-09-04 | Disposition: A | Discharge status: Home / Self Care | Payer: Medicare HMO | Source: Ambulatory Visit | Attending: Cardiovascular Disease | Admitting: Cardiovascular Disease

## 2017-09-04 ENCOUNTER — Ambulatory Visit (HOSPITAL_COMMUNITY)
Admission: RE | Admit: 2017-09-04 | Discharge: 2017-09-04 | Disposition: A | Discharge status: Home / Self Care | Payer: Medicare HMO | Source: Ambulatory Visit | Attending: Cardiology | Admitting: Cardiology

## 2017-09-04 DIAGNOSIS — I739 Peripheral vascular disease, unspecified: Secondary | ICD-10-CM | POA: Insufficient documentation

## 2017-09-21 ENCOUNTER — Ambulatory Visit
Admission: RE | Admit: 2017-09-21 | Discharge: 2017-09-21 | Disposition: A | Discharge status: Home / Self Care | Payer: Medicare HMO | Source: Ambulatory Visit | Attending: Family Medicine | Admitting: Family Medicine

## 2017-09-21 DIAGNOSIS — N950 Postmenopausal bleeding: Secondary | ICD-10-CM | POA: Diagnosis not present

## 2017-09-21 DIAGNOSIS — R9389 Abnormal findings on diagnostic imaging of other specified body structures: Secondary | ICD-10-CM

## 2017-09-25 ENCOUNTER — Telehealth: Payer: Self-pay | Admitting: Cardiovascular Disease

## 2017-09-25 ENCOUNTER — Encounter: Payer: Self-pay | Admitting: *Deleted

## 2017-09-25 DIAGNOSIS — Z01818 Encounter for other preprocedural examination: Secondary | ICD-10-CM

## 2017-09-25 DIAGNOSIS — I739 Peripheral vascular disease, unspecified: Secondary | ICD-10-CM

## 2017-09-25 NOTE — Telephone Encounter (Signed)
Patient has been informed of procedure time and date. She will take her aspirin and Plavix the morning of the procedure and does not need to hold any medication. Labs will be drawn at [facility] by Friday the 27th.   Letter of instructions will be mailed to her. She will call back with any questions.

## 2017-09-25 NOTE — Telephone Encounter (Signed)
Called patient, she states that her leg pain is not as bad as before but it is still present. I did advised patient of the result notes to have angiogram and stent placed if pain did not get better or worsened. Patient is unsure if she would like to have this done still. I did advise patient I would send a message to Dr.Arida and to Misty StanleyLisa as she may inquire information about those procedures and maybe get them scheduled.  Thanks!

## 2017-09-25 NOTE — Telephone Encounter (Signed)
She can use Tylenol for pain.  I do not recommend anything stronger than that.  We cannot prescribe cilostazol given that she is on both aspirin and Plavix.  As mentioned before, the next step is to do an angiogram if her pain level is not controlled.

## 2017-09-25 NOTE — Telephone Encounter (Signed)
Patient called and verbalized her agreement to pv angiogram. This has been scheduled for Wednesday 10/03/17 with Dr. Kirke CorinArida at 8:30.   Message has been left for her to call back for instructions.

## 2017-09-25 NOTE — Telephone Encounter (Signed)
° °  Patient calling to report leg pain. Requesting pain medication.  No swelling

## 2017-09-27 ENCOUNTER — Other Ambulatory Visit
Admission: RE | Admit: 2017-09-27 | Discharge: 2017-09-27 | Disposition: A | Discharge status: Home / Self Care | Payer: Medicare HMO | Source: Ambulatory Visit | Attending: Cardiovascular Disease | Admitting: Cardiovascular Disease

## 2017-09-27 DIAGNOSIS — I739 Peripheral vascular disease, unspecified: Secondary | ICD-10-CM | POA: Insufficient documentation

## 2017-09-27 DIAGNOSIS — Z01818 Encounter for other preprocedural examination: Secondary | ICD-10-CM | POA: Diagnosis not present

## 2017-09-27 LAB — BASIC METABOLIC PANEL
ANION GAP: 8 (ref 5–15)
BUN: 15 mg/dL (ref 8–23)
CALCIUM: 9.3 mg/dL (ref 8.9–10.3)
CO2: 29 mmol/L (ref 22–32)
Chloride: 103 mmol/L (ref 98–111)
Creatinine, Ser: 0.75 mg/dL (ref 0.44–1.00)
Glucose, Bld: 103 mg/dL — ABNORMAL HIGH (ref 70–99)
Potassium: 4.2 mmol/L (ref 3.5–5.1)
Sodium: 140 mmol/L (ref 135–145)

## 2017-09-27 LAB — CBC
HEMATOCRIT: 34.3 % — AB (ref 35.0–47.0)
Hemoglobin: 12.2 g/dL (ref 12.0–16.0)
MCH: 32.1 pg (ref 26.0–34.0)
MCHC: 35.5 g/dL (ref 32.0–36.0)
MCV: 90.5 fL (ref 80.0–100.0)
PLATELETS: 125 10*3/uL — AB (ref 150–440)
RBC: 3.8 MIL/uL (ref 3.80–5.20)
RDW: 12.8 % (ref 11.5–14.5)
WBC: 6 10*3/uL (ref 3.6–11.0)

## 2017-10-03 ENCOUNTER — Other Ambulatory Visit: Payer: Self-pay

## 2017-10-03 ENCOUNTER — Encounter (HOSPITAL_COMMUNITY)
Admission: RE | Disposition: A | Discharge status: Home / Self Care | Payer: Self-pay | Source: Ambulatory Visit | Attending: Cardiovascular Disease

## 2017-10-03 ENCOUNTER — Ambulatory Visit (HOSPITAL_COMMUNITY)
Admission: RE | Admit: 2017-10-03 | Discharge: 2017-10-03 | Disposition: A | Discharge status: Home / Self Care | Payer: Medicare HMO | Source: Ambulatory Visit | Attending: Cardiovascular Disease | Admitting: Cardiovascular Disease

## 2017-10-03 DIAGNOSIS — G4700 Insomnia, unspecified: Secondary | ICD-10-CM | POA: Diagnosis not present

## 2017-10-03 DIAGNOSIS — Z7982 Long term (current) use of aspirin: Secondary | ICD-10-CM | POA: Insufficient documentation

## 2017-10-03 DIAGNOSIS — Z79899 Other long term (current) drug therapy: Secondary | ICD-10-CM | POA: Insufficient documentation

## 2017-10-03 DIAGNOSIS — Z87442 Personal history of urinary calculi: Secondary | ICD-10-CM | POA: Diagnosis not present

## 2017-10-03 DIAGNOSIS — Z951 Presence of aortocoronary bypass graft: Secondary | ICD-10-CM | POA: Insufficient documentation

## 2017-10-03 DIAGNOSIS — M1990 Unspecified osteoarthritis, unspecified site: Secondary | ICD-10-CM | POA: Insufficient documentation

## 2017-10-03 DIAGNOSIS — I711 Thoracic aortic aneurysm, ruptured: Secondary | ICD-10-CM | POA: Diagnosis not present

## 2017-10-03 DIAGNOSIS — E039 Hypothyroidism, unspecified: Secondary | ICD-10-CM | POA: Diagnosis not present

## 2017-10-03 DIAGNOSIS — Z79890 Hormone replacement therapy (postmenopausal): Secondary | ICD-10-CM | POA: Diagnosis not present

## 2017-10-03 DIAGNOSIS — Z9049 Acquired absence of other specified parts of digestive tract: Secondary | ICD-10-CM | POA: Insufficient documentation

## 2017-10-03 DIAGNOSIS — F419 Anxiety disorder, unspecified: Secondary | ICD-10-CM | POA: Diagnosis not present

## 2017-10-03 DIAGNOSIS — Z87891 Personal history of nicotine dependence: Secondary | ICD-10-CM | POA: Diagnosis not present

## 2017-10-03 DIAGNOSIS — F329 Major depressive disorder, single episode, unspecified: Secondary | ICD-10-CM | POA: Diagnosis not present

## 2017-10-03 DIAGNOSIS — I70211 Atherosclerosis of native arteries of extremities with intermittent claudication, right leg: Secondary | ICD-10-CM | POA: Diagnosis not present

## 2017-10-03 DIAGNOSIS — K219 Gastro-esophageal reflux disease without esophagitis: Secondary | ICD-10-CM | POA: Diagnosis not present

## 2017-10-03 DIAGNOSIS — E785 Hyperlipidemia, unspecified: Secondary | ICD-10-CM | POA: Insufficient documentation

## 2017-10-03 DIAGNOSIS — Z9851 Tubal ligation status: Secondary | ICD-10-CM | POA: Diagnosis not present

## 2017-10-03 DIAGNOSIS — Z8249 Family history of ischemic heart disease and other diseases of the circulatory system: Secondary | ICD-10-CM | POA: Insufficient documentation

## 2017-10-03 DIAGNOSIS — I771 Stricture of artery: Secondary | ICD-10-CM | POA: Diagnosis not present

## 2017-10-03 DIAGNOSIS — Z9889 Other specified postprocedural states: Secondary | ICD-10-CM | POA: Insufficient documentation

## 2017-10-03 DIAGNOSIS — I119 Hypertensive heart disease without heart failure: Secondary | ICD-10-CM | POA: Diagnosis not present

## 2017-10-03 DIAGNOSIS — Z955 Presence of coronary angioplasty implant and graft: Secondary | ICD-10-CM | POA: Insufficient documentation

## 2017-10-03 DIAGNOSIS — R9389 Abnormal findings on diagnostic imaging of other specified body structures: Secondary | ICD-10-CM | POA: Diagnosis not present

## 2017-10-03 DIAGNOSIS — I70201 Unspecified atherosclerosis of native arteries of extremities, right leg: Secondary | ICD-10-CM | POA: Diagnosis not present

## 2017-10-03 DIAGNOSIS — I700 Atherosclerosis of aorta: Secondary | ICD-10-CM | POA: Diagnosis not present

## 2017-10-03 HISTORY — PX: ABDOMINAL AORTOGRAM W/LOWER EXTREMITY: CATH118223

## 2017-10-03 HISTORY — PX: PERIPHERAL VASCULAR INTERVENTION: CATH118257

## 2017-10-03 LAB — POCT ACTIVATED CLOTTING TIME: Activated Clotting Time: 241 seconds

## 2017-10-03 SURGERY — ABDOMINAL AORTOGRAM W/LOWER EXTREMITY
Anesthesia: LOCAL

## 2017-10-03 MED ORDER — SODIUM CHLORIDE 0.9% FLUSH: 3.0000 mL | INTRAVENOUS | Status: DC | PRN

## 2017-10-03 MED ORDER — MIDAZOLAM HCL 2 MG/2ML IJ SOLN
INTRAMUSCULAR | Status: AC
  Filled 2017-10-03: qty 2

## 2017-10-03 MED ORDER — IODIXANOL 320 MG/ML IV SOLN
INTRAVENOUS | Status: DC | PRN
  Administered 2017-10-03: 105 mL via INTRAVENOUS

## 2017-10-03 MED ORDER — ASPIRIN 81 MG PO CHEW: 81.0000 mg | CHEWABLE_TABLET | ORAL | Status: DC

## 2017-10-03 MED ORDER — SODIUM CHLORIDE 0.9 % IV SOLN: 250.0000 mL | INTRAVENOUS | Status: DC | PRN

## 2017-10-03 MED ORDER — SODIUM CHLORIDE 0.9% FLUSH: 3.0000 mL | Freq: Two times a day (BID) | INTRAVENOUS | Status: DC

## 2017-10-03 MED ORDER — ACETAMINOPHEN 325 MG PO TABS: 650.0000 mg | ORAL_TABLET | ORAL | Status: DC | PRN

## 2017-10-03 MED ORDER — NITROGLYCERIN 1 MG/10 ML FOR IR/CATH LAB
INTRA_ARTERIAL | Status: AC
  Filled 2017-10-03: qty 10

## 2017-10-03 MED ORDER — HEPARIN SODIUM (PORCINE) 1000 UNIT/ML IJ SOLN
INTRAMUSCULAR | Status: DC | PRN
  Administered 2017-10-03: 5000 [IU] via INTRAVENOUS

## 2017-10-03 MED ORDER — MIDAZOLAM HCL 2 MG/2ML IJ SOLN
INTRAMUSCULAR | Status: DC | PRN
  Administered 2017-10-03 (×3): 1 mg via INTRAVENOUS

## 2017-10-03 MED ORDER — HEPARIN (PORCINE) IN NACL 1000-0.9 UT/500ML-% IV SOLN
INTRAVENOUS | Status: AC
  Filled 2017-10-03: qty 1000

## 2017-10-03 MED ORDER — FENTANYL CITRATE (PF) 100 MCG/2ML IJ SOLN
INTRAMUSCULAR | Status: AC
  Filled 2017-10-03: qty 2

## 2017-10-03 MED ORDER — SODIUM CHLORIDE 0.9 % WEIGHT BASED INFUSION: 1.0000 mL/kg/h | INTRAVENOUS | Status: DC

## 2017-10-03 MED ORDER — LIDOCAINE HCL (PF) 1 % IJ SOLN
INTRAMUSCULAR | Status: AC
  Filled 2017-10-03: qty 30

## 2017-10-03 MED ORDER — FENTANYL CITRATE (PF) 100 MCG/2ML IJ SOLN
INTRAMUSCULAR | Status: DC | PRN
  Administered 2017-10-03: 50 ug via INTRAVENOUS

## 2017-10-03 MED ORDER — SODIUM CHLORIDE 0.9 % WEIGHT BASED INFUSION
3.0000 mL/kg/h | INTRAVENOUS | Status: DC
  Administered 2017-10-03: 173 mL via INTRAVENOUS

## 2017-10-03 MED ORDER — SODIUM CHLORIDE 0.9 % IV SOLN: INTRAVENOUS | Status: AC

## 2017-10-03 MED ORDER — HEPARIN (PORCINE) IN NACL 1000-0.9 UT/500ML-% IV SOLN
INTRAVENOUS | Status: DC | PRN
  Administered 2017-10-03 (×2): 500 mL

## 2017-10-03 MED ORDER — LIDOCAINE HCL (PF) 1 % IJ SOLN
INTRAMUSCULAR | Status: DC | PRN
  Administered 2017-10-03: 15 mL

## 2017-10-03 MED ORDER — NITROGLYCERIN 1 MG/10 ML FOR IR/CATH LAB
INTRA_ARTERIAL | Status: DC | PRN
  Administered 2017-10-03: 200 ug via INTRA_ARTERIAL
  Administered 2017-10-03: 300 ug via INTRA_ARTERIAL

## 2017-10-03 MED ORDER — ONDANSETRON HCL 4 MG/2ML IJ SOLN: 4.0000 mg | Freq: Four times a day (QID) | INTRAMUSCULAR | Status: DC | PRN

## 2017-10-03 MED ORDER — HYDRALAZINE HCL 20 MG/ML IJ SOLN: 5.0000 mg | INTRAMUSCULAR | Status: DC | PRN

## 2017-10-03 SURGICAL SUPPLY — 19 items
CATH ANGIO 5F PIGTAIL 65CM (CATHETERS) ×3 IMPLANT
CATH STRAIGHT 5FR 65CM (CATHETERS) ×3 IMPLANT
DEVICE CLOSURE MYNXGRIP 6/7F (Vascular Products) ×3 IMPLANT
KIT ENCORE 26 ADVANTAGE (KITS) ×3 IMPLANT
KIT MICROPUNCTURE NIT STIFF (SHEATH) ×3 IMPLANT
KIT PV (KITS) ×3 IMPLANT
SHEATH BRITE TIP 7FR 35CM (SHEATH) ×3 IMPLANT
SHEATH PINNACLE 5F 10CM (SHEATH) ×3 IMPLANT
SHEATH PINNACLE 7F 10CM (SHEATH) ×3 IMPLANT
STENT EXPRESS LD 8X27X75 (Permanent Stent) ×3 IMPLANT
STOPCOCK MORSE 400PSI 3WAY (MISCELLANEOUS) ×3 IMPLANT
SYR MEDRAD MARK V 150ML (SYRINGE) ×3 IMPLANT
TAPE VIPERTRACK RADIOPAQ (MISCELLANEOUS) ×2 IMPLANT
TAPE VIPERTRACK RADIOPAQUE (MISCELLANEOUS) ×1
TRANSDUCER W/STOPCOCK (MISCELLANEOUS) ×3 IMPLANT
TRAY PV CATH (CUSTOM PROCEDURE TRAY) ×3 IMPLANT
TUBING CIL FLEX 10 FLL-RA (TUBING) ×3 IMPLANT
WIRE HITORQ VERSACORE ST 145CM (WIRE) ×3 IMPLANT
WIRE VERSACORE LOC 115CM (WIRE) ×3 IMPLANT

## 2017-10-03 NOTE — Discharge Instructions (Signed)

## 2017-10-03 NOTE — H&P (Signed)
Cardiology Office Note   Date:  08/28/2017   ID:  Dawn Boyer, Dawn Boyer [birth year], MRN [id number]  PCP:  [name], MD       Cardiologist:  Dr. Eldridge Dace  No chief complaint on file.     History of Present Illness: Dawn Boyer is a 72 y.o. female who is here today for a follow up visit regarding left subclavian artery stenosis.  She has known history of coronary artery disease status post CABG in 1994, essential hypertension, moderate left carotid stenosis, hypothyroidism and hyperlipidemia.  There is remote history of tobacco use. She had worsening angina early this year.  She underwent cardiac catheterization in April which showed severe underlying three-vessel coronary artery disease with patent LIMA to LAD, SVG to diagonal and SVG to left PDA.  There was severe stenosis and SVG to diagonal which was treated successfully with drug-eluting stent placement. She was found to have severe heavily calcified stenosis in the left subclavian artery likely compromising the flow into the LIMA.  There was a 30 mm gradient and pressure between both arms.   Unfortunately, the patient developed a large groin hematoma with retroperitoneal bleed after the procedure.    I proceeded with arch angiography and left subclavian artery stenting last month without complications.  Postprocedure carotid Doppler showed moderate left carotid stenosis and patent left subclavian artery stent with antegrade flow in the vertebral arteries.  She had sudden onset of right leg pain affecting mostly the thigh area which was sudden and required taking tramadol and oxycodone.  She went to the emergency room at [facility].  She had CTA of the abdomen and pelvis which showed moderate iliac artery disease with no other acute findings.  There was an incidental finding of significant endometrial thickening.  Right lower extremity venous Doppler showed no evidence of DVT.      Past Medical History:  Diagnosis Date  . Anemia     hx  . Anxiety   . Arthritis   . Carotid arterial disease (HCC)    a. 04/2015 Carotid U/S: RICA 1-39%, LICA 40-59%, no change.  . Coronary artery disease    a. 1994 s/p CABG;  b. 03/2012 MV: No ischemia/infarct.  . Depression   . Diverticulosis   . Elevated LFTs   . Fibrocystic breast   . GERD (gastroesophageal reflux disease)   . Hemorrhoid   . Hemorrhoids   . History of hemolysis, elevated liver enzymes, and low platelet (HELLP) syndrome, currently pregnant    patient denies history  . Hyperlipidemia   . Hypertension   . Hypertensive heart disease   . Hypothyroidism   . Insomnia   . Nephrolithiasis   . PONV (postoperative nausea and vomiting)    after heart surgery         Past Surgical History:  Procedure Laterality Date  . AORTIC ARCH ANGIOGRAPHY N/A 07/25/2017   Procedure: AORTIC ARCH ANGIOGRAPHY;  Surgeon: Iran Ouch, MD;  Location: [facility] INVASIVE CV LAB;  Service: Cardiovascular;  Laterality: N/A;  . CARDIAC CATHETERIZATION    . CHOLECYSTECTOMY  2002  . CORONARY ARTERY BYPASS GRAFT  1993  . CORONARY STENT INTERVENTION N/A 04/03/2017   Procedure: CORONARY STENT INTERVENTION;  Surgeon: Corky Crafts, MD;  Location: [facility] INVASIVE CV LAB;  Service: Cardiovascular;  Laterality: N/A;  . EVALUATION UNDER ANESTHESIA WITH HEMORRHOIDECTOMY N/A 03/14/2012   Procedure: EXAM UNDER ANESTHESIA WITH Excisional HEMORRHOIDECTOMY and Hemorrhoidal banding;  Surgeon: Atilano Ina, MD;  Location: [facility] OR;  Service: General;  Laterality: N/A;  x 2 hemorrhoids  . HEMORRHOID SURGERY    . LEFT HEART CATH AND CORS/GRAFTS ANGIOGRAPHY N/A 04/03/2017   Procedure: LEFT HEART CATH AND CORS/GRAFTS ANGIOGRAPHY;  Surgeon: Corky Crafts, MD;  Location: [facility] INVASIVE CV LAB;  Service: Cardiovascular;  Laterality: N/A;  . PERIPHERAL VASCULAR INTERVENTION Left 07/25/2017   Procedure: PERIPHERAL VASCULAR INTERVENTION;  Surgeon: Iran Ouch, MD;  Location: [facility]  INVASIVE CV LAB;  Service: Cardiovascular;  Laterality: Left;  Subclavian  . TUBAL LIGATION  1970  . ULTRASOUND GUIDANCE FOR VASCULAR ACCESS  04/03/2017   Procedure: Ultrasound Guidance For Vascular Access;  Surgeon: Corky Crafts, MD;  Location: [facility] INVASIVE CV LAB;  Service: Cardiovascular;;  . UPPER EXTREMITY ANGIOGRAPHY Left 07/25/2017   Procedure: UPPER EXTREMITY ANGIOGRAPHY;  Surgeon: Iran Ouch, MD;  Location: [facility] INVASIVE CV LAB;  Service: Cardiovascular;  Laterality: Left;           Current Outpatient Medications  Medication Sig Dispense Refill  . ALPRAZolam (XANAX) 1 MG tablet Take 1 mg by mouth at bedtime.     Marland Kitchen amLODipine (NORVASC) 5 MG tablet Take 1 tablet (5 mg total) by mouth daily. 90 tablet 3  . aspirin 81 MG chewable tablet Chew 1 tablet (81 mg total) by mouth daily.    . clopidogrel (PLAVIX) 75 MG tablet Take 1 tablet (75 mg total) by mouth daily with breakfast. 30 tablet 11  . levothyroxine (SYNTHROID, LEVOTHROID) 50 MCG tablet Take 50 mcg by mouth daily before breakfast.     . metoprolol succinate (TOPROL-XL) 50 MG 24 hr tablet Take half (1/2) tablet (25 mg) by mouth in the morning and one (1) tablet (50 mg) by mouth in the evening. Take with or immediately following a meal.    . nitroGLYCERIN (NITROSTAT) 0.4 MG SL tablet Place 0.4 mg under the tongue every 5 (five) minutes x 3 doses as needed for chest pain.    . pantoprazole (PROTONIX) 40 MG tablet Take 1 tablet (40 mg total) by mouth daily. (Patient taking differently: Take 40 mg by mouth at bedtime. ) 30 tablet 3  . quinapril (ACCUPRIL) 20 MG tablet Take 20 mg by mouth 2 (two) times daily.     . rosuvastatin (CRESTOR) 40 MG tablet Take 1 tablet (40 mg total) by mouth daily at 6 PM.    . sertraline (ZOLOFT) 100 MG tablet Take 50-100 mg by mouth See admin instructions. Take 50 mg in by mouth in am and 100 mg by mouth in the pm    . Sodium Phosphates (ENEMA RE) Place 1 each rectally 2 (two)  times daily.    . Tetrahydrozoline HCl (VISINE OP) Place 1 drop into both eyes daily as needed (for dry eyes).    Marland Kitchen tiZANidine (ZANAFLEX) 4 MG tablet Take 4 mg by mouth at bedtime as needed for muscle spasms.      No current facility-administered medications for this visit.     Allergies:   Patient has no known allergies.    Social History:  The patient  reports that she quit smoking about 34 years ago. Her smoking use included cigarettes. She has a 12.00 pack-year smoking history. She has never used smokeless tobacco. She reports that she does not drink alcohol or use drugs.   Family History:  The patient's family history includes Diabetes in her daughter; Heart Problems in her son; Heart disease in her brother and sister.    ROS:  Please see the  history of present illness.   Otherwise, review of systems are positive for none.   All other systems are reviewed and negative.    PHYSICAL EXAM: VS:  BP 130/62 (BP Location: Left Arm, Patient Position: Sitting, Cuff Size: Normal)   Pulse 64   Ht 5\' 3"  (1.6 m)   Wt 127 lb (57.6 kg)   BMI 22.50 kg/m  , BMI Body mass index is 22.5 kg/m. GEN: Well nourished, well developed, in no acute distress  HEENT: normal  Neck: no JVD.  Left carotid bruit. Cardiac: RRR; no murmurs, rubs, or gallops,no edema  Respiratory:  clear to auscultation bilaterally, normal work of breathing GI: soft, nontender, nondistended, + BS MS: no deformity or atrophy  Skin: warm and dry, no rash Neuro:  Strength and sensation are intact Psych: euthymic mood, full affect Vascular: Right radial pulses normal.  Left radial pulses normal Femoral pulses are slightly diminished bilaterally.  No groin hematoma  EKG:  EKG is not ordered today.    Recent Labs: 08/27/2017: ALT 28; BUN 25; Creatinine, Ser 0.75; Hemoglobin 11.0; Platelets 116; Potassium 3.9; Sodium 140    Lipid Panel Labs(Brief)          Component Value Date/Time   CHOL 239 (H)  02/01/2016 1351   TRIG 247 (H) 02/01/2016 1351   HDL 47 02/01/2016 1351   CHOLHDL 5.1 (H) 02/01/2016 1351   CHOLHDL 4 04/08/2014 1055   VLDL 28.4 04/08/2014 1055   LDLCALC 143 (H) 02/01/2016 1351   LDLDIRECT 90.8 01/08/2014 0828           Wt Readings from Last 3 Encounters:  08/28/17 127 lb (57.6 kg)  08/27/17 125 lb (56.7 kg)  07/25/17 126 lb (57.2 kg)        PAD Screen 04/10/2017  Previous PAD dx? Yes  Previous surgical procedure? No  Pain with walking? Yes  Subsides with rest? Yes  Feet/toe relief with dangling? No  Painful, non-healing ulcers? No  Extremities discolored? No      ASSESSMENT AND PLAN:  1.  Significant left subclavian artery stenosis.  Status post successful stent placement last year without complications.  She is already on dual antiplatelet therapy.  Repeat carotid Doppler in 1 year.  2.  Coronary artery disease involving graft coronary arteries with other forms of angina: She reports improvement in symptoms after recent stent placement to SVG to diagonal.  Continue dual antiplatelet therapy.    3.  Peripheral arterial disease: She does have what seems to be at least moderate bilateral iliac disease.  This does not really explain her severe right thigh discomfort which might be neuropathic or musculoskeletal.  Nonetheless, I am going to obtain an aortoiliac duplex and ABI.  4.  Essential hypertension: Blood pressure is controlled on current medications.  5.  Hyperlipidemia: Continue treatment with rosuvastatin with a target LDL of less than 70.  6.  Endometrial thickening noted on CT scan: This seems to be an incidental finding and I advised her to follow-up with her primary care physician for further work-up.   Disposition:   FU with me in 6 months  Signed,  [name], MD  08/28/2017 10:15 AM    [facility]   Addendum on October 03, 2017:  The patient continued to have severe right hip  and right leg discomfort with walking which is now happening with minimal activities. We proceeded woman noninvasive vascular testing which showed mildly reduced ABI on the right side and normal  on the left.  Duplex showed severe right common iliac artery stenosis with a peak velocity of 516.  There was moderate left common iliac artery stenosis with a peak velocity of 299.  Given her continued symptoms and these findings, I have recommended proceeding with abdominal aortogram with lower extremity runoff and possible endovascular intervention.  My exam today she has normal pulses on the left side.  On the right, the dorsalis pedis is palpable but diminished.  Posterior tibial is not palpable.  The procedure has been discussed with the patient as well as risks and benefits and she is agreeable to proceed.  [name], MD 10/03/2017

## 2017-10-04 ENCOUNTER — Encounter (HOSPITAL_COMMUNITY): Payer: Self-pay | Admitting: Cardiovascular Disease

## 2017-10-04 ENCOUNTER — Telehealth: Payer: Self-pay | Admitting: Cardiovascular Disease

## 2017-10-04 ENCOUNTER — Telehealth: Payer: Self-pay | Admitting: *Deleted

## 2017-10-04 DIAGNOSIS — I739 Peripheral vascular disease, unspecified: Secondary | ICD-10-CM

## 2017-10-04 NOTE — Telephone Encounter (Signed)
-----   Message from Sandi Mariscal, RN sent at 10/04/2017  1:06 PM EDT ----- Regarding: Aorta and Abi Patient needs to have an Aorta and ABI scheduled post procedure (within the next two weeks). She is a former [facility] patient. She is coming to [place] because she lives in [place]. Thank you

## 2017-10-04 NOTE — Telephone Encounter (Signed)
Patient scheduled for 10/16

## 2017-10-04 NOTE — Telephone Encounter (Signed)
Appointment made for post angiogram follow up and orders placed for duplex and ABI. Message sent to scheduling.

## 2017-10-16 ENCOUNTER — Other Ambulatory Visit: Payer: Self-pay | Admitting: Cardiovascular Disease

## 2017-10-16 DIAGNOSIS — I739 Peripheral vascular disease, unspecified: Secondary | ICD-10-CM

## 2017-10-17 ENCOUNTER — Ambulatory Visit (INDEPENDENT_AMBULATORY_CARE_PROVIDER_SITE_OTHER): Payer: Medicare HMO

## 2017-10-17 DIAGNOSIS — I739 Peripheral vascular disease, unspecified: Secondary | ICD-10-CM

## 2017-10-19 ENCOUNTER — Telehealth: Payer: Self-pay | Admitting: *Deleted

## 2017-10-19 DIAGNOSIS — I739 Peripheral vascular disease, unspecified: Secondary | ICD-10-CM

## 2017-10-19 NOTE — Telephone Encounter (Signed)
Patient made aware of results and verbalized understanding.  Orders have been placed. Patient has been reminded to keep appointment at the [facility] office for the angiogram follow up.

## 2017-10-19 NOTE — Telephone Encounter (Signed)
-----   Message from Iran Ouch, MD sent at 10/19/2017  2:03 PM EDT ----- Normal ABI and patent right iliac stent.  Repeat studies in 6 months.

## 2017-11-02 ENCOUNTER — Ambulatory Visit (INDEPENDENT_AMBULATORY_CARE_PROVIDER_SITE_OTHER): Payer: 59 | Admitting: Cardiovascular Disease

## 2017-11-02 ENCOUNTER — Encounter: Payer: Self-pay | Admitting: Cardiovascular Disease

## 2017-11-02 VITALS — BP 138/72 | HR 72 | Ht 63.0 in | Wt 128.2 lb

## 2017-11-02 DIAGNOSIS — I25118 Atherosclerotic heart disease of native coronary artery with other forms of angina pectoris: Secondary | ICD-10-CM

## 2017-11-02 DIAGNOSIS — I739 Peripheral vascular disease, unspecified: Secondary | ICD-10-CM | POA: Diagnosis not present

## 2017-11-02 DIAGNOSIS — E785 Hyperlipidemia, unspecified: Secondary | ICD-10-CM | POA: Diagnosis not present

## 2017-11-02 DIAGNOSIS — I10 Essential (primary) hypertension: Secondary | ICD-10-CM

## 2017-11-02 MED ORDER — EVOLOCUMAB 140 MG/ML ~~LOC~~ SOAJ: 140.0000 mg | SUBCUTANEOUS | 11 refills | Status: DC

## 2017-11-02 NOTE — Patient Instructions (Signed)
Medication Instructions:  Dr. Kirke Corin recommends Repatha 140 mg every 14 days (PCSK9). This is an injectable cholesterol medication. This medication will need prior approval with your insurance company, which we will work on. If the medication is not approved initially, we may need to do an appeal with your insurance. We will keep you updated on this process. This medication can be provided at some local pharmacies or be shipped to you from a specialty pharmacy.   If you need a refill on your cardiac medications before your next appointment, please call your pharmacy.   Lab work: Your provider would like for you to return next week to have the following labs drawn: Fasting Lipid. Please go to the [facility] entrance and check in at the front desk. You do not need an appointment.   If you have labs (blood work) drawn today and your tests are completely normal, you will receive your results only by: Marland Kitchen MyChart Message (if you have MyChart) OR . A paper copy in the mail If you have any lab test that is abnormal or we need to change your treatment, we will call you to review the results.  Testing/Procedures: None ordered  Follow-Up: At [facility], you and your health needs are our priority.  As part of our continuing mission to provide you with exceptional heart care, we have created designated Provider Care Teams.  These Care Teams include your primary Cardiologist (physician) and Advanced Practice Providers (APPs -  Physician Assistants and Nurse Practitioners) who all work together to provide you with the care you need, when you need it. You will need a follow up appointment in 4 months.  Please call our office 2 months in advance to schedule this appointment.  You may see Dr. Kirke Corin or one of the following Advanced Practice Providers on your designated Care Team:   Nicolasa Ducking, NP Eula Listen, PA-C . Marisue Ivan, PA-C

## 2017-11-02 NOTE — Progress Notes (Signed)
Cardiology Office Note   Date:  11/02/2017   ID:  Dawn Boyer, DOB 07-10-1945, MRN [id number]  PCP:  [name], MD  Cardiologist:  Dr. Eldridge Dace  Chief Complaint  Patient presents with  . other    F/u angiogram c/o unable to sleep. Meds reviewed verbally with pt.      History of Present Illness: Dawn Boyer is a 72 y.o. female who is here today for a follow up visit regarding left subclavian artery stenosis.  She has known history of coronary artery disease status post CABG in 1994, essential hypertension, moderate left carotid stenosis, hypothyroidism and hyperlipidemia.  There is remote history of tobacco use. She had worsening angina early this year.  She underwent cardiac catheterization in April which showed severe underlying three-vessel coronary artery disease with patent LIMA to LAD, SVG to diagonal and SVG to left PDA.  There was severe stenosis and SVG to diagonal which was treated successfully with drug-eluting stent placement. She was found to have severe heavily calcified stenosis in the left subclavian artery likely compromising the flow into the LIMA.  There was a 30 mm gradient and pressure between both arms.    The patient had left subclavian artery stenting in July of this year. Postprocedure carotid Doppler showed moderate left carotid stenosis and patent left subclavian artery stent with antegrade flow in the vertebral arteries.  She is known to have peripheral arterial disease and had worsening right thigh and hip claudication.  Angiography was performed last month which showed heavily calcified abdominal aorta with moderate stenosis distally, significant right renal artery stenosis, significant right common iliac artery stenosis at the ostium with moderate disease distally and moderate left iliac artery disease.  I performed successful balloon expandable stent placement to the ostial right common iliac artery which extended 2 mm into the distal aorta.  She  reports resolution of right leg claudication and overall is doing very well.  She denies any chest pain or shortness of breath.  She is having issues with insomnia.  Past Medical History:  Diagnosis Date  . Anemia    hx  . Anxiety   . Arthritis   . Carotid arterial disease (HCC)    a. 04/2015 Carotid U/S: RICA 1-39%, LICA 40-59%, no change.  . Coronary artery disease    a. 1994 s/p CABG;  b. 03/2012 MV: No ischemia/infarct.  . Depression   . Diverticulosis   . Elevated LFTs   . Fibrocystic breast   . GERD (gastroesophageal reflux disease)   . Hemorrhoid   . Hemorrhoids   . History of hemolysis, elevated liver enzymes, and low platelet (HELLP) syndrome, currently pregnant    patient denies history  . Hyperlipidemia   . Hypertension   . Hypertensive heart disease   . Hypothyroidism   . Insomnia   . Nephrolithiasis   . PONV (postoperative nausea and vomiting)    after heart surgery    Past Surgical History:  Procedure Laterality Date  . ABDOMINAL AORTOGRAM W/LOWER EXTREMITY N/A 10/03/2017   Procedure: ABDOMINAL AORTOGRAM W/LOWER EXTREMITY;  Surgeon: Iran Ouch, MD;  Location: [facility] INVASIVE CV LAB;  Service: Cardiovascular;  Laterality: N/A;  Bilateral Limited  . AORTIC ARCH ANGIOGRAPHY N/A 07/25/2017   Procedure: AORTIC ARCH ANGIOGRAPHY;  Surgeon: Iran Ouch, MD;  Location: [facility] INVASIVE CV LAB;  Service: Cardiovascular;  Laterality: N/A;  . CARDIAC CATHETERIZATION    . CHOLECYSTECTOMY  2002  . CORONARY ARTERY BYPASS GRAFT  1993  .  CORONARY STENT INTERVENTION N/A 04/03/2017   Procedure: CORONARY STENT INTERVENTION;  Surgeon: Corky Crafts, MD;  Location: [facility] INVASIVE CV LAB;  Service: Cardiovascular;  Laterality: N/A;  . EVALUATION UNDER ANESTHESIA WITH HEMORRHOIDECTOMY N/A 03/14/2012   Procedure: EXAM UNDER ANESTHESIA WITH Excisional HEMORRHOIDECTOMY and Hemorrhoidal banding;  Surgeon: Atilano Ina, MD;  Location: [facility] OR;  Service: General;  Laterality: N/A;  x 2  hemorrhoids  . HEMORRHOID SURGERY    . LEFT HEART CATH AND CORS/GRAFTS ANGIOGRAPHY N/A 04/03/2017   Procedure: LEFT HEART CATH AND CORS/GRAFTS ANGIOGRAPHY;  Surgeon: Corky Crafts, MD;  Location: [facility] INVASIVE CV LAB;  Service: Cardiovascular;  Laterality: N/A;  . PERIPHERAL VASCULAR INTERVENTION Left 07/25/2017   Procedure: PERIPHERAL VASCULAR INTERVENTION;  Surgeon: Iran Ouch, MD;  Location: [facility] INVASIVE CV LAB;  Service: Cardiovascular;  Laterality: Left;  Subclavian  . PERIPHERAL VASCULAR INTERVENTION  10/03/2017   Procedure: PERIPHERAL VASCULAR INTERVENTION;  Surgeon: Iran Ouch, MD;  Location: [facility] INVASIVE CV LAB;  Service: Cardiovascular;;  LCIA  . TUBAL LIGATION  1970  . ULTRASOUND GUIDANCE FOR VASCULAR ACCESS  04/03/2017   Procedure: Ultrasound Guidance For Vascular Access;  Surgeon: Corky Crafts, MD;  Location: [facility] INVASIVE CV LAB;  Service: Cardiovascular;;  . UPPER EXTREMITY ANGIOGRAPHY Left 07/25/2017   Procedure: UPPER EXTREMITY ANGIOGRAPHY;  Surgeon: Iran Ouch, MD;  Location: [facility] INVASIVE CV LAB;  Service: Cardiovascular;  Laterality: Left;     Current Outpatient Medications  Medication Sig Dispense Refill  . ALPRAZolam (XANAX) 1 MG tablet Take 1 mg by mouth at bedtime.     Marland Kitchen amLODipine (NORVASC) 5 MG tablet Take 1 tablet (5 mg total) by mouth daily. 90 tablet 3  . Aromatic Inhalants (VICKS VAPOINHALER IN) Inhale 1 puff into the lungs daily as needed (congestion).    Marland Kitchen aspirin 81 MG chewable tablet Chew 1 tablet (81 mg total) by mouth daily.    Marland Kitchen bismuth subsalicylate (PEPTO BISMOL) 262 MG/15ML suspension Take 30 mLs by mouth as needed for indigestion.    . clopidogrel (PLAVIX) 75 MG tablet Take 1 tablet (75 mg total) by mouth daily with breakfast. 30 tablet 11  . ibuprofen (ADVIL,MOTRIN) 200 MG tablet Take 400 mg by mouth daily as needed for headache or moderate pain.    Marland Kitchen levothyroxine (SYNTHROID, LEVOTHROID) 50 MCG tablet Take 50 mcg by mouth daily  before breakfast.     . linaclotide (LINZESS) 290 MCG CAPS capsule Take 290 mcg by mouth daily as needed (constipation).    . metoprolol succinate (TOPROL-XL) 50 MG 24 hr tablet Take 25-50 mg by mouth See admin instructions. Take 25 mg in the morning and 50 mg at night    . nitroGLYCERIN (NITROSTAT) 0.4 MG SL tablet Place 0.4 mg under the tongue every 5 (five) minutes x 3 doses as needed for chest pain.    . pantoprazole (PROTONIX) 40 MG tablet Take 1 tablet (40 mg total) by mouth daily. (Patient taking differently: Take 40 mg by mouth at bedtime. ) 30 tablet 3  . quinapril (ACCUPRIL) 20 MG tablet Take 20 mg by mouth 2 (two) times daily.     . rosuvastatin (CRESTOR) 40 MG tablet Take 1 tablet (40 mg total) by mouth daily at 6 PM.    . sertraline (ZOLOFT) 100 MG tablet Take 50-100 mg by mouth See admin instructions. Take 50 mg in by mouth in am and 100 mg by mouth in the pm    . Sodium Phosphates (ENEMA  RE) Place 1 each rectally 2 (two) times daily. May use an additional 2 times as needed for constipation    . Tetrahydrozoline HCl (VISINE OP) Place 1 drop into both eyes daily as needed (for dry eyes).    Marland Kitchen tiZANidine (ZANAFLEX) 4 MG tablet Take 4 mg by mouth at bedtime as needed for muscle spasms.     . Evolocumab (REPATHA SURECLICK) 140 MG/ML SOAJ Inject 140 mg into the skin every 14 (fourteen) days. 2 pen 11   No current facility-administered medications for this visit.     Allergies:   Patient has no known allergies.    Social History:  The patient  reports that she quit smoking about 34 years ago. Her smoking use included cigarettes. She has a 12.00 pack-year smoking history. She has never used smokeless tobacco. She reports that she does not drink alcohol or use drugs.   Family History:  The patient's family history includes Diabetes in her daughter; Heart Problems in her son; Heart disease in her brother and sister.    ROS:  Please see the history of present illness.   Otherwise, review  of systems are positive for none.   All other systems are reviewed and negative.    PHYSICAL EXAM: VS:  BP 138/72 (BP Location: Left Arm, Patient Position: Sitting, Cuff Size: Normal)   Pulse 72   Ht 5\' 3"  (1.6 m)   Wt 128 lb 4 oz (58.2 kg)   BMI 22.72 kg/m  , BMI Body mass index is 22.72 kg/m. GEN: Well nourished, well developed, in no acute distress  HEENT: normal  Neck: no JVD.  Left carotid bruit. Cardiac: RRR; no murmurs, rubs, or gallops,no edema  Respiratory:  clear to auscultation bilaterally, normal work of breathing GI: soft, nontender, nondistended, + BS MS: no deformity or atrophy  Skin: warm and dry, no rash Neuro:  Strength and sensation are intact Psych: euthymic mood, full affect Vascular: Right radial pulses normal.  Left radial pulses normal Femoral pulses are normal bilaterally  EKG:  EKG is ordered today. EKG showed sinus rhythm with frequent PVCs and anterolateral T wave changes suggestive of ischemia.   Recent Labs: 08/27/2017: ALT 28 09/27/2017: BUN 15; Creatinine, Ser 0.75; Hemoglobin 12.2; Platelets 125; Potassium 4.2; Sodium 140    Lipid Panel    Component Value Date/Time   CHOL 239 (H) 02/01/2016 1351   TRIG 247 (H) 02/01/2016 1351   HDL 47 02/01/2016 1351   CHOLHDL 5.1 (H) 02/01/2016 1351   CHOLHDL 4 04/08/2014 1055   VLDL 28.4 04/08/2014 1055   LDLCALC 143 (H) 02/01/2016 1351   LDLDIRECT 90.8 01/08/2014 0828      Wt Readings from Last 3 Encounters:  11/02/17 128 lb 4 oz (58.2 kg)  10/03/17 126 lb 15.8 oz (57.6 kg)  08/28/17 127 lb (57.6 kg)        PAD Screen 04/10/2017  Previous PAD dx? Yes  Previous surgical procedure? No  Pain with walking? Yes  Subsides with rest? Yes  Feet/toe relief with dangling? No  Painful, non-healing ulcers? No  Extremities discolored? No      ASSESSMENT AND PLAN:  1.  Significant left subclavian artery stenosis.  Status post successful stent placement .  Normal left radial pulse.  Repeat carotid  Doppler in August 2020.  2.  Coronary artery disease involving graft coronary arteries with other forms of angina: Improved symptoms after recent stent placement to SVG to diagonal.  Continue dual antiplatelet therapy.  3.  Peripheral arterial disease: Resolution of right leg claudication after recent right common iliac artery stent placement.  ABI was normal post procedures.  Repeat studies in 6 months.    4.  Essential hypertension: Blood pressure is controlled on current medications.  5.  Hyperlipidemia: The patient has known history of severe hyperlipidemia in spite of maximal dose rosuvastatin.  Most recent lipid profile in August showed an LDL of 154.  Given her extensive atherosclerosis, will need to be more aggressive with her hyperlipidemia and thus I elected to start her on Repatha.    Disposition:   FU with me in 4 months  Signed,  [name], MD  11/02/2017 3:56 PM    [facility]

## 2017-11-05 ENCOUNTER — Other Ambulatory Visit
Admission: RE | Admit: 2017-11-05 | Discharge: 2017-11-05 | Disposition: A | Discharge status: Home / Self Care | Payer: 59 | Source: Ambulatory Visit | Attending: Cardiovascular Disease | Admitting: Cardiovascular Disease

## 2017-11-05 DIAGNOSIS — E785 Hyperlipidemia, unspecified: Secondary | ICD-10-CM | POA: Insufficient documentation

## 2017-11-05 DIAGNOSIS — I25118 Atherosclerotic heart disease of native coronary artery with other forms of angina pectoris: Secondary | ICD-10-CM | POA: Diagnosis not present

## 2017-11-05 DIAGNOSIS — I10 Essential (primary) hypertension: Secondary | ICD-10-CM | POA: Insufficient documentation

## 2017-11-05 LAB — LIPID PANEL
CHOL/HDL RATIO: 4 ratio
Cholesterol: 166 mg/dL (ref 0–200)
HDL: 42 mg/dL (ref >40)
LDL CALC: 95 mg/dL (ref 0–99)
Triglycerides: 145 mg/dL (ref <150)
VLDL: 29 mg/dL (ref 0–40)

## 2017-11-06 ENCOUNTER — Ambulatory Visit: Payer: 59 | Admitting: *Deleted

## 2017-11-06 DIAGNOSIS — E785 Hyperlipidemia, unspecified: Secondary | ICD-10-CM

## 2017-11-06 NOTE — Progress Notes (Signed)
Patient came in today for instructions on how to inject Repatha. Patient was educated and was able to inject her first dose herself. She injected the Repatha in the left outer abdomen without any problems. Another sample was provided for her to inject in 14 days (11/19). She will call if she has any questions.   Medication Samples have been provided to the patient.  Drug name: Repatha       Strength: 140 mg/ml        Qty: 2 pens  LOT: [id number]  Exp.Date: 11/21

## 2017-11-12 ENCOUNTER — Telehealth: Payer: Self-pay | Admitting: *Deleted

## 2017-11-12 NOTE — Telephone Encounter (Signed)
Prior Authorization for Repatha 140 mg/ml [employer] has been started.   Key: [id number]

## 2017-11-19 ENCOUNTER — Telehealth: Payer: Self-pay | Admitting: Cardiovascular Disease

## 2017-11-19 NOTE — Telephone Encounter (Signed)
New Message   Pt c/o medication issue:  1. Name of Medication: Evolocumab (REPATHA SURECLICK) 140 MG/ML SOAJ  2. How are you currently taking this medication (dosage and times per day)? Inject 140 mg into the skin every 14 (fourteen) days  3. Are you having a reaction (difficulty breathing--STAT)?   4. What is your medication issue? Pt states that she is experiencing some muscle aches with this medication and would like to speak with a nurse. Please call

## 2017-11-20 NOTE — Telephone Encounter (Signed)
New message:      Pt c/o medication issue:  1. Name of Medication: Evolocumab (REPATHA SURECLICK) 140 MG/ML SOAJ  2. How are you currently taking this medication (dosage and times per day)? Inject 140 mg into the skin every 14 (fourteen) days.  3. Are you having a reaction (difficulty breathing--STAT)? No  4. What is your medication issue?   Pt states that she is experiencing some muscle aches with this medication and would like to speak with a nurse. Please call

## 2017-11-20 NOTE — Telephone Encounter (Signed)
Returned the call to the patient. She stated that since she has started the Repatha on 11/06/17 she has been experiencing muscle pain in her arms and legs. This began a few days after the first injection.  She is due for the second dose today but is reluctant to take it. Message has been routed to the provider and pharmD for further recommendation.

## 2017-11-20 NOTE — Telephone Encounter (Signed)
Slight discomfort may be experience for up to 4th dose and should disappear after that.    We encourage patient to keep using unless discomfort if affecting daily living. Otherwise we can try Praluent instead.

## 2017-11-20 NOTE — Telephone Encounter (Signed)
Patient calling to check on status States that she does not feel well when she takes Repatha and is reluctant to take  Please call to discuss

## 2017-11-21 NOTE — Telephone Encounter (Signed)
Ok to stop Repatha. Continue to take Crestor .

## 2017-11-21 NOTE — Telephone Encounter (Signed)
Call placed to the patient with the recommendations. She stated that she felt horrible and did not want to keep trying the Repatha. She was also hesitant about the Praluent if there was a risk of feeling the same way.  She would like to know if she can continue with the medication or try another one.

## 2017-11-21 NOTE — Telephone Encounter (Signed)
Patient made aware of Dr. Jari SportsmanArida's recommendations and verbalized her understanding.

## 2018-02-11 DIAGNOSIS — M25511 Pain in right shoulder: Secondary | ICD-10-CM | POA: Diagnosis not present

## 2018-02-11 DIAGNOSIS — K5900 Constipation, unspecified: Secondary | ICD-10-CM | POA: Diagnosis not present

## 2018-02-11 DIAGNOSIS — I10 Essential (primary) hypertension: Secondary | ICD-10-CM | POA: Diagnosis not present

## 2018-02-11 DIAGNOSIS — E782 Mixed hyperlipidemia: Secondary | ICD-10-CM | POA: Diagnosis not present

## 2018-02-11 DIAGNOSIS — D696 Thrombocytopenia, unspecified: Secondary | ICD-10-CM | POA: Diagnosis not present

## 2018-02-11 DIAGNOSIS — I2510 Atherosclerotic heart disease of native coronary artery without angina pectoris: Secondary | ICD-10-CM | POA: Diagnosis not present

## 2018-02-15 ENCOUNTER — Other Ambulatory Visit: Payer: Self-pay | Admitting: Cardiology

## 2018-02-25 DIAGNOSIS — M7541 Impingement syndrome of right shoulder: Secondary | ICD-10-CM | POA: Diagnosis not present

## 2018-03-05 ENCOUNTER — Ambulatory Visit: Payer: Medicare HMO | Admitting: Cardiovascular Disease

## 2018-03-05 ENCOUNTER — Encounter: Payer: Self-pay | Admitting: Cardiovascular Disease

## 2018-03-05 VITALS — BP 118/68 | HR 74 | Ht 63.0 in | Wt 127.5 lb

## 2018-03-05 DIAGNOSIS — E785 Hyperlipidemia, unspecified: Secondary | ICD-10-CM

## 2018-03-05 DIAGNOSIS — I739 Peripheral vascular disease, unspecified: Secondary | ICD-10-CM | POA: Diagnosis not present

## 2018-03-05 DIAGNOSIS — I10 Essential (primary) hypertension: Secondary | ICD-10-CM

## 2018-03-05 DIAGNOSIS — I2510 Atherosclerotic heart disease of native coronary artery without angina pectoris: Secondary | ICD-10-CM

## 2018-03-05 DIAGNOSIS — I771 Stricture of artery: Secondary | ICD-10-CM | POA: Diagnosis not present

## 2018-03-05 DIAGNOSIS — I779 Disorder of arteries and arterioles, unspecified: Secondary | ICD-10-CM

## 2018-03-05 MED ORDER — EZETIMIBE 10 MG PO TABS: 10.0000 mg | ORAL_TABLET | Freq: Every day | ORAL | 1 refills | Status: DC

## 2018-03-05 MED ORDER — EZETIMIBE 10 MG PO TABS: 10.0000 mg | ORAL_TABLET | Freq: Every day | ORAL | 0 refills | Status: DC

## 2018-03-05 NOTE — Patient Instructions (Signed)
Medication Instructions:  START Zetia 10 mg tablet daily  If you need a refill on your cardiac medications before your next appointment, please call your pharmacy.   Lab work: Your provider would like for you to return in 6 weeks to have the following labs drawn: FASTING lipid and liver.   Testing/Procedures: Your physician has requested that you have an ankle brachial index (ABI) in August. During this test an ultrasound and blood pressure cuff are used to evaluate the arteries that supply the arms and legs with blood. Allow thirty minutes for this exam. There are no restrictions or special instructions.   Your physician has requested that you have an Aorta/Iliac Duplex in August. This will be take place at [address], the [facility] office.    No food after 11PM the night before.  Water is OK. (Don't drink liquids if you have been instructed not to for ANOTHER test).  Take two Extra-Strength Gas-X capsules at bedtime the night before test.   Take an additional two Extra-Strength Gas-X capsules three (3) hours before the test or first thing in the morning.    Avoid foods that produce bowel gas, for 24 hours prior to exam (see below).    No breakfast, no chewing gum, no smoking or carbonated beverages.  Patient may take morning medications with water.  Come in for test at least 15 minutes early to register.  Your physician has requested that you have a carotid duplex in August. This test is an ultrasound of the carotid arteries in your neck. It looks at blood flow through these arteries that supply the brain with blood. Allow one hour for this exam. There are no restrictions or special instructions. This will take place at [address], the [facility] office.  Follow-Up: At [facility], you and your health needs are our priority.  As part of our continuing mission to provide you with exceptional heart care, we have created designated Provider  Care Teams.  These Care Teams include your primary Cardiologist (physician) and Advanced Practice Providers (APPs -  Physician Assistants and Nurse Practitioners) who all work together to provide you with the care you need, when you need it. You will need a follow up appointment in 6 months.  Please call our office 2 months in advance to schedule this appointment.  You may see Dr. Kirke Corin or one of the following Advanced Practice Providers on your designated Care Team:   Nicolasa Ducking, NP Eula Listen, PA-C . Marisue Ivan, PA-C

## 2018-03-05 NOTE — Progress Notes (Signed)
Cardiology Office Note   Date:  03/05/2018   ID:  Dawn Boyer, DOB [birth year], MRN [id number]  PCP:  [name], MD  Cardiologist:  Dr. Eldridge Dace, [name], MD   Chief Complaint  Patient presents with  . other    4 month follow up. Meds reviewed by the pt. verbally. "doing well."       History of Present Illness: Dawn Boyer is a 72 y.o. female who is here today for a follow up visit regarding left subclavian artery stenosis.  She has known history of coronary artery disease status post CABG in 1994, essential hypertension, moderate left carotid stenosis, hypothyroidism and hyperlipidemia.  There is remote history of tobacco use. She had worsening angina in 2019 .  She underwent cardiac catheterization in April which showed severe underlying three-vessel coronary artery disease with patent LIMA to LAD, SVG to diagonal and SVG to left PDA.  There was severe stenosis and SVG to diagonal which was treated successfully with drug-eluting stent placement. She was found to have severe heavily calcified stenosis in the left subclavian artery likely compromising the flow into the LIMA.  There was a 30 mm gradient and pressure between both arms.    The patient had left subclavian artery stenting in July of 2019. Postprocedure carotid Doppler showed moderate left carotid stenosis and patent left subclavian artery stent with antegrade flow in the vertebral arteries. She was found to have peripheral arterial disease with severe right leg claudication. Angiography performed 10/03/2017 showed heavily calcified abdominal aorta with moderate stenosis distally, significant right renal artery stenosis, significant right common iliac artery stenosis at the ostium with moderate disease distally and moderate left iliac artery disease.  I performed successful stent placement to the ostial right common iliac artery which extended 2 mm into the distal aorta.  Patients is doing well today. Accompanied by  her daughter. Reports that her cholesterol is still bad. Still compliant with the Crestor. She stopped taking Repatha injections after having adverse reactions.  She was having right arm pain due to inflammation and received a cortisol shot to resolve it. Daughter endorses that she has been depressed since the passing of her son in May 2017. They deny chest pain, SOB, palpitations or any other related symptoms or complaints at this time.  No chest pain or leg /arm claudication.   Past Medical History:  Diagnosis Date  . Anemia    hx  . Anxiety   . Arthritis   . Carotid arterial disease (HCC)    a. 04/2015 Carotid U/S: RICA 1-39%, LICA 40-59%, no change.  . Coronary artery disease    a. 1994 s/p CABG;  b. 03/2012 MV: No ischemia/infarct.  . Depression   . Diverticulosis   . Elevated LFTs   . Fibrocystic breast   . GERD (gastroesophageal reflux disease)   . Hemorrhoid   . Hemorrhoids   . History of hemolysis, elevated liver enzymes, and low platelet (HELLP) syndrome, currently pregnant    patient denies history  . Hyperlipidemia   . Hypertension   . Hypertensive heart disease   . Hypothyroidism   . Insomnia   . Nephrolithiasis   . PONV (postoperative nausea and vomiting)    after heart surgery    Past Surgical History:  Procedure Laterality Date  . ABDOMINAL AORTOGRAM W/LOWER EXTREMITY N/A 10/03/2017   Procedure: ABDOMINAL AORTOGRAM W/LOWER EXTREMITY;  Surgeon: Iran Ouch, MD;  Location: [facility];  Service: Cardiovascular;  Laterality: N/A;  Bilateral Limited  . AORTIC ARCH ANGIOGRAPHY N/A 07/25/2017   Procedure: AORTIC ARCH ANGIOGRAPHY;  Surgeon: Iran Ouch, MD;  Location: [facility];  Service: Cardiovascular;  Laterality: N/A;  . CARDIAC CATHETERIZATION    . CHOLECYSTECTOMY  2002  . CORONARY ARTERY BYPASS GRAFT  1993  . CORONARY STENT INTERVENTION N/A 04/03/2017   Procedure: CORONARY STENT INTERVENTION;  Surgeon: Corky Crafts, MD;   Location: [facility] INVASIVE CV LAB;  Service: Cardiovascular;  Laterality: N/A;  . EVALUATION UNDER ANESTHESIA WITH HEMORRHOIDECTOMY N/A 03/14/2012   Procedure: EXAM UNDER ANESTHESIA WITH Excisional HEMORRHOIDECTOMY and Hemorrhoidal banding;  Surgeon: Atilano Ina, MD;  Location: [facility] OR;  Service: General;  Laterality: N/A;  x 2 hemorrhoids  . HEMORRHOID SURGERY    . LEFT HEART CATH AND CORS/GRAFTS ANGIOGRAPHY N/A 04/03/2017   Procedure: LEFT HEART CATH AND CORS/GRAFTS ANGIOGRAPHY;  Surgeon: Corky Crafts, MD;  Location: [facility] INVASIVE CV LAB;  Service: Cardiovascular;  Laterality: N/A;  . PERIPHERAL VASCULAR INTERVENTION Left 07/25/2017   Procedure: PERIPHERAL VASCULAR INTERVENTION;  Surgeon: Iran Ouch, MD;  Location: [facility];  Service: Cardiovascular;  Laterality: Left;  Subclavian  . PERIPHERAL VASCULAR INTERVENTION  10/03/2017   Procedure: PERIPHERAL VASCULAR INTERVENTION;  Surgeon: Iran Ouch, MD;  Location: [facility];  Service: Cardiovascular;;  LCIA  . TUBAL LIGATION  1970  . ULTRASOUND GUIDANCE FOR VASCULAR ACCESS  04/03/2017   Procedure: Ultrasound Guidance For Vascular Access;  Surgeon: Corky Crafts, MD;  Location: [facility] INVASIVE CV LAB;  Service: Cardiovascular;;  . UPPER EXTREMITY ANGIOGRAPHY Left 07/25/2017   Procedure: UPPER EXTREMITY ANGIOGRAPHY;  Surgeon: Iran Ouch, MD;  Location: [facility];  Service: Cardiovascular;  Laterality: Left;     Current Outpatient Medications  Medication Sig Dispense Refill  . ALPRAZolam (XANAX) 1 MG tablet Take 1 mg by mouth at bedtime.     Marland Kitchen amLODipine (NORVASC) 5 MG tablet Take 1 tablet (5 mg total) by mouth daily. 90 tablet 3  . Aromatic Inhalants (VICKS VAPOINHALER IN) Inhale 1 puff into the lungs daily as needed (congestion).    Marland Kitchen aspirin 81 MG chewable tablet Chew 1 tablet (81 mg total) by mouth daily.    Marland Kitchen bismuth subsalicylate (PEPTO BISMOL) 262 MG/15ML suspension Take 30 mLs by mouth as needed  for indigestion.    . clopidogrel (PLAVIX) 75 MG tablet TAKE 1 TABLET ONE TIME DAILY  WITH  BREAKFAST 90 tablet 2  . ibuprofen (ADVIL,MOTRIN) 200 MG tablet Take 400 mg by mouth daily as needed for headache or moderate pain.    Marland Kitchen levothyroxine (SYNTHROID, LEVOTHROID) 50 MCG tablet Take 50 mcg by mouth daily before breakfast.     . linaclotide (LINZESS) 290 MCG CAPS capsule Take 290 mcg by mouth daily as needed (constipation).    . metoprolol succinate (TOPROL-XL) 50 MG 24 hr tablet Take 25-50 mg by mouth See admin instructions. Take 25 mg in the morning and 50 mg at night    . nitroGLYCERIN (NITROSTAT) 0.4 MG SL tablet Place 0.4 mg under the tongue every 5 (five) minutes x 3 doses as needed for chest pain.    . pantoprazole (PROTONIX) 40 MG tablet Take 1 tablet (40 mg total) by mouth daily. (Patient taking differently: Take 40 mg by mouth at bedtime. ) 30 tablet 3  . quinapril (ACCUPRIL) 20 MG tablet Take 20 mg by mouth 2 (two) times daily.     . rosuvastatin (CRESTOR) 40 MG  tablet Take 1 tablet (40 mg total) by mouth daily at 6 PM.    . sertraline (ZOLOFT) 100 MG tablet Take 50-100 mg by mouth See admin instructions. Take 50 mg in by mouth in am and 100 mg by mouth in the pm    . Sodium Phosphates (ENEMA RE) Place 1 each rectally 2 (two) times daily. May use an additional 2 times as needed for constipation    . Tetrahydrozoline HCl (VISINE OP) Place 1 drop into both eyes daily as needed (for dry eyes).    Marland Kitchen tiZANidine (ZANAFLEX) 4 MG tablet Take 4 mg by mouth at bedtime as needed for muscle spasms.      No current facility-administered medications for this visit.     Allergies:   Other    Social History:  The patient  reports that she quit smoking about 35 years ago. Her smoking use included cigarettes. She has a 12.00 pack-year smoking history. She has never used smokeless tobacco. She reports that she does not drink alcohol or use drugs.   Family History:  The patient's family history  includes Diabetes in her daughter; Heart Problems in her son; Heart disease in her brother and sister.    ROS:  Please see the history of present illness.   Otherwise, review of systems are positive for none.   All other systems are reviewed and negative.    PHYSICAL EXAM: VS:  BP 118/68 (BP Location: Left Arm, Patient Position: Sitting, Cuff Size: Normal)   Pulse 74   Ht  (1.6 m)   Wt 127 lb 8 oz (57.8 kg)   BMI 22.59 kg/m  , BMI Body mass index is 22.59 kg/m. GEN: Well nourished, well developed, in no acute distress  HEENT: normal  Neck: no JVD.  Left carotid bruit. Cardiac: RRR; no murmurs, rubs, or gallops,no edema Respiratory:  clear to auscultation bilaterally, normal work of breathing GI: soft, nontender, nondistended, + BS MS: no deformity or atrophy  Skin: warm and dry, no rash Neuro:  Strength and sensation are intact Psych: euthymic mood, full affect Vascular: Right radial pulses normal.  Left radial pulses normal   EKG:  EKG is ordered today.  EKG showed normal sinus rhythm with anterolateral T wave changes suggestive of ischemia.   Recent Labs: 08/27/2017: ALT 28 09/27/2017: BUN 15; Creatinine, Ser 0.75; Hemoglobin 12.2; Platelets 125; Potassium 4.2; Sodium 140    Lipid Panel    Component Value Date/Time   CHOL 166 11/05/2017 1240   CHOL 239 (H) 02/01/2016 1351   TRIG 145 11/05/2017 1240   HDL 42 11/05/2017 1240   HDL 47 02/01/2016 1351   CHOLHDL 4.0 11/05/2017 1240   VLDL 29 11/05/2017 1240   LDLCALC 95 11/05/2017 1240   LDLCALC 143 (H) 02/01/2016 1351   LDLDIRECT 90.8 01/08/2014 0828      Wt Readings from Last 3 Encounters:  03/05/18 127 lb 8 oz (57.8 kg)  11/02/17 128 lb 4 oz (58.2 kg)  10/03/17 126 lb 15.8 oz (57.6 kg)        PAD Screen 04/10/2017  Previous PAD dx? Yes  Previous surgical procedure? No  Pain with walking? Yes  Subsides with rest? Yes  Feet/toe relief with dangling? No  Painful, non-healing ulcers? No  Extremities  discolored? No      ASSESSMENT AND PLAN:  1.  Significant left subclavian artery stenosis.  Status post successful stent placement .  Normal left radial pulse.  Repeat carotid Doppler in August  2020.  2.  Coronary artery disease involving graft coronary arteries without angina: Continue dual antiplatelet therapy.    3.  Peripheral arterial disease: Status post right common iliac artery stent placement for severe claudication with resolution of symptoms.  Repeat aortoiliac duplex and ABI in August.    4.  Essential hypertension: Blood pressure is controlled on current medications.  5.  Hyperlipidemia: The patient has known history of severe hyperlipidemia in spite of maximal dose rosuvastatin.  She did not tolerate Repatha as she had flulike symptoms.  I am going to add Zetia 10 mg daily.  Check fasting lipid and liver profile in 6 weeks.  If triglyceride is above 150, I recommend adding Vascepa.    Disposition:   FU with me in 6 months  I, Diona Browner am acting as a Neurosurgeon for [name], M.D.  I have reviewed the above documentation for accuracy and completeness, and I agree with the above.   Signed, [name], MD 03/05/18 [facility] [place], [place] [phone]

## 2018-03-07 DIAGNOSIS — M7541 Impingement syndrome of right shoulder: Secondary | ICD-10-CM | POA: Diagnosis not present

## 2018-03-07 DIAGNOSIS — M25511 Pain in right shoulder: Secondary | ICD-10-CM | POA: Diagnosis not present

## 2018-04-29 ENCOUNTER — Other Ambulatory Visit: Payer: Medicare HMO

## 2018-05-15 ENCOUNTER — Telehealth: Payer: Self-pay

## 2018-05-15 NOTE — Telephone Encounter (Signed)
PA started in Covermymeds.  When completing PA message from plan stated:  Authorization already on file for this request Dawn Boyer (Key: [id number])  Repatha SureClick 140MG /ML auto-injectors

## 2018-08-13 ENCOUNTER — Other Ambulatory Visit: Payer: Self-pay | Admitting: Cardiovascular Disease

## 2018-08-13 DIAGNOSIS — I739 Peripheral vascular disease, unspecified: Secondary | ICD-10-CM

## 2018-08-14 ENCOUNTER — Other Ambulatory Visit: Payer: Self-pay

## 2018-08-14 ENCOUNTER — Other Ambulatory Visit: Payer: Self-pay | Admitting: Cardiovascular Disease

## 2018-08-14 ENCOUNTER — Ambulatory Visit (INDEPENDENT_AMBULATORY_CARE_PROVIDER_SITE_OTHER): Payer: Medicare HMO

## 2018-08-14 DIAGNOSIS — E039 Hypothyroidism, unspecified: Secondary | ICD-10-CM | POA: Diagnosis not present

## 2018-08-14 DIAGNOSIS — I739 Peripheral vascular disease, unspecified: Secondary | ICD-10-CM

## 2018-08-14 DIAGNOSIS — I6523 Occlusion and stenosis of bilateral carotid arteries: Secondary | ICD-10-CM

## 2018-08-14 DIAGNOSIS — Z0000 Encounter for general adult medical examination without abnormal findings: Secondary | ICD-10-CM | POA: Diagnosis not present

## 2018-08-14 DIAGNOSIS — E782 Mixed hyperlipidemia: Secondary | ICD-10-CM | POA: Diagnosis not present

## 2018-08-14 DIAGNOSIS — I2510 Atherosclerotic heart disease of native coronary artery without angina pectoris: Secondary | ICD-10-CM | POA: Diagnosis not present

## 2018-08-14 DIAGNOSIS — D696 Thrombocytopenia, unspecified: Secondary | ICD-10-CM | POA: Diagnosis not present

## 2018-08-14 DIAGNOSIS — I10 Essential (primary) hypertension: Secondary | ICD-10-CM | POA: Diagnosis not present

## 2018-08-15 ENCOUNTER — Other Ambulatory Visit: Payer: Self-pay

## 2018-08-15 DIAGNOSIS — I739 Peripheral vascular disease, unspecified: Secondary | ICD-10-CM

## 2018-08-15 DIAGNOSIS — I6523 Occlusion and stenosis of bilateral carotid arteries: Secondary | ICD-10-CM

## 2018-08-22 ENCOUNTER — Telehealth: Payer: Self-pay

## 2018-08-22 DIAGNOSIS — I6523 Occlusion and stenosis of bilateral carotid arteries: Secondary | ICD-10-CM

## 2018-08-22 MED ORDER — EZETIMIBE 10 MG PO TABS: 10.0000 mg | ORAL_TABLET | Freq: Every day | ORAL | 3 refills | Status: DC

## 2018-08-22 NOTE — Telephone Encounter (Signed)
-----   Message from Wellington Hampshire, MD sent at 08/15/2018  2:29 PM EDT ----- Inform patient that carotid Doppler showed stable moderate left carotid stenosis and patent left subclavian artery stent.  Repeat study in 1 year.

## 2018-08-22 NOTE — Telephone Encounter (Signed)
Patient made aware of carotid dopp results with verbal understanding. Order in [place] for 66yr repeat carotid.  Patient rqst a refill for Zetia be sent to [facility] mail order pharmacy. Rx sent for Zetia 10mg  daily #90 R-3

## 2018-08-28 DIAGNOSIS — E782 Mixed hyperlipidemia: Secondary | ICD-10-CM | POA: Diagnosis not present

## 2018-08-28 DIAGNOSIS — I2510 Atherosclerotic heart disease of native coronary artery without angina pectoris: Secondary | ICD-10-CM | POA: Diagnosis not present

## 2018-08-28 DIAGNOSIS — Z0000 Encounter for general adult medical examination without abnormal findings: Secondary | ICD-10-CM | POA: Diagnosis not present

## 2018-08-28 DIAGNOSIS — D696 Thrombocytopenia, unspecified: Secondary | ICD-10-CM | POA: Diagnosis not present

## 2018-08-28 DIAGNOSIS — R7989 Other specified abnormal findings of blood chemistry: Secondary | ICD-10-CM | POA: Diagnosis not present

## 2018-08-28 DIAGNOSIS — D649 Anemia, unspecified: Secondary | ICD-10-CM | POA: Diagnosis not present

## 2018-08-28 DIAGNOSIS — E039 Hypothyroidism, unspecified: Secondary | ICD-10-CM | POA: Diagnosis not present

## 2018-09-02 DIAGNOSIS — R52 Pain, unspecified: Secondary | ICD-10-CM | POA: Diagnosis not present

## 2018-09-02 DIAGNOSIS — R945 Abnormal results of liver function studies: Secondary | ICD-10-CM | POA: Diagnosis not present

## 2018-09-04 DIAGNOSIS — R945 Abnormal results of liver function studies: Secondary | ICD-10-CM | POA: Diagnosis not present

## 2018-10-30 ENCOUNTER — Other Ambulatory Visit: Payer: Self-pay | Admitting: Cardiology

## 2019-03-11 DIAGNOSIS — K5900 Constipation, unspecified: Secondary | ICD-10-CM | POA: Diagnosis not present

## 2019-03-11 DIAGNOSIS — I2510 Atherosclerotic heart disease of native coronary artery without angina pectoris: Secondary | ICD-10-CM | POA: Diagnosis not present

## 2019-03-11 DIAGNOSIS — D696 Thrombocytopenia, unspecified: Secondary | ICD-10-CM | POA: Diagnosis not present

## 2019-03-11 DIAGNOSIS — I739 Peripheral vascular disease, unspecified: Secondary | ICD-10-CM | POA: Diagnosis not present

## 2019-03-11 DIAGNOSIS — I10 Essential (primary) hypertension: Secondary | ICD-10-CM | POA: Diagnosis not present

## 2019-03-11 DIAGNOSIS — E039 Hypothyroidism, unspecified: Secondary | ICD-10-CM | POA: Diagnosis not present

## 2019-03-11 DIAGNOSIS — E782 Mixed hyperlipidemia: Secondary | ICD-10-CM | POA: Diagnosis not present

## 2019-03-11 DIAGNOSIS — G4700 Insomnia, unspecified: Secondary | ICD-10-CM | POA: Diagnosis not present

## 2019-05-16 ENCOUNTER — Telehealth: Payer: Self-pay | Admitting: Cardiovascular Disease

## 2019-05-16 NOTE — Telephone Encounter (Signed)
°  Patient Consent for Virtual Visit         Dawn Boyer has provided verbal consent on 05/16/2019 for a virtual visit (video or telephone).   CONSENT FOR VIRTUAL VISIT FOR:  Dawn Boyer  By participating in this virtual visit I agree to the following:  I hereby voluntarily request, consent and authorize [facility] and its employed or contracted physicians, physician assistants, nurse practitioners or other licensed health care professionals (the Practitioner), to provide me with telemedicine health care services (the Services") as deemed necessary by the treating Practitioner. I acknowledge and consent to receive the Services by the Practitioner via telemedicine. I understand that the telemedicine visit will involve communicating with the Practitioner through live audiovisual communication technology and the disclosure of certain medical information by electronic transmission. I acknowledge that I have been given the opportunity to request an in-person assessment or other available alternative prior to the telemedicine visit and am voluntarily participating in the telemedicine visit.  I understand that I have the right to withhold or withdraw my consent to the use of telemedicine in the course of my care at any time, without affecting my right to future care or treatment, and that the Practitioner or I may terminate the telemedicine visit at any time. I understand that I have the right to inspect all information obtained and/or recorded in the course of the telemedicine visit and may receive copies of available information for a reasonable fee.  I understand that some of the potential risks of receiving the Services via telemedicine include:   Delay or interruption in medical evaluation due to technological equipment failure or disruption;  Information transmitted may not be sufficient (e.g. poor resolution of images) to allow for appropriate medical decision making by the Practitioner;  and/or   In rare instances, security protocols could fail, causing a breach of personal health information.  Furthermore, I acknowledge that it is my responsibility to provide information about my medical history, conditions and care that is complete and accurate to the best of my ability. I acknowledge that Practitioner's advice, recommendations, and/or decision may be based on factors not within their control, such as incomplete or inaccurate data provided by me or distortions of diagnostic images or specimens that may result from electronic transmissions. I understand that the practice of medicine is not an exact science and that Practitioner makes no warranties or guarantees regarding treatment outcomes. I acknowledge that a copy of this consent can be made available to me via my patient portal [facility] MyChart), or I can request a printed copy by calling the office of [facility].    I understand that my insurance will be billed for this visit.   I have read or had this consent read to me.  I understand the contents of this consent, which adequately explains the benefits and risks of the Services being provided via telemedicine.   I have been provided ample opportunity to ask questions regarding this consent and the Services and have had my questions answered to my satisfaction.  I give my informed consent for the services to be provided through the use of telemedicine in my medical care

## 2019-06-15 ENCOUNTER — Other Ambulatory Visit: Payer: Self-pay | Admitting: Cardiology

## 2019-06-28 ENCOUNTER — Other Ambulatory Visit: Payer: Self-pay | Admitting: Cardiovascular Disease

## 2019-07-03 ENCOUNTER — Ambulatory Visit (INDEPENDENT_AMBULATORY_CARE_PROVIDER_SITE_OTHER): Payer: Medicare HMO

## 2019-07-03 ENCOUNTER — Other Ambulatory Visit: Payer: Self-pay

## 2019-07-03 DIAGNOSIS — I739 Peripheral vascular disease, unspecified: Secondary | ICD-10-CM | POA: Diagnosis not present

## 2019-07-03 DIAGNOSIS — I6523 Occlusion and stenosis of bilateral carotid arteries: Secondary | ICD-10-CM | POA: Diagnosis not present

## 2019-07-04 ENCOUNTER — Encounter: Payer: Self-pay | Admitting: Cardiovascular Disease

## 2019-07-04 ENCOUNTER — Telehealth (INDEPENDENT_AMBULATORY_CARE_PROVIDER_SITE_OTHER): Payer: Medicare HMO | Admitting: Cardiovascular Disease

## 2019-07-04 VITALS — BP 120/69 | HR 64 | Ht 63.0 in | Wt 131.0 lb

## 2019-07-04 DIAGNOSIS — I2510 Atherosclerotic heart disease of native coronary artery without angina pectoris: Secondary | ICD-10-CM

## 2019-07-04 DIAGNOSIS — I779 Disorder of arteries and arterioles, unspecified: Secondary | ICD-10-CM

## 2019-07-04 MED ORDER — NITROGLYCERIN 0.4 MG SL SUBL: 0.4000 mg | SUBLINGUAL_TABLET | SUBLINGUAL | 1 refills | Status: DC | PRN

## 2019-07-04 NOTE — Progress Notes (Signed)
Virtual Visit via Telephone Note   This visit type was conducted due to national recommendations for restrictions regarding the COVID-19 Pandemic (e.g. social distancing) in an effort to limit this patient's exposure and mitigate transmission in our community.  Due to her co-morbid illnesses, this patient is at least at moderate risk for complications without adequate follow up.  This format is felt to be most appropriate for this patient at this time.  The patient did not have access to video technology/had technical difficulties with video requiring transitioning to audio format only (telephone).  All issues noted in this document were discussed and addressed.  No physical exam could be performed with this format.  Please refer to the patient's chart for her  consent to telehealth for [facility].   The patient was identified using 2 identifiers.  Date:  07/04/2019   ID:  Dawn Boyer, DOB 05/25/1945, MRN [id number]  Patient Location: Home Provider Location: Office  PCP:  Farris HasMorrow, Aaron, MD  Cardiologist:  Lance MussJayadeep Varanasi, MD  Electrophysiologist:  None   Evaluation Performed:  Follow-Up Visit  Chief Complaint: Follow-up visit with no complaints.  History of Present Illness:    Dawn Boyer is a 74 y.o. female was reached via phone for follow-up visit regarding left subclavian artery stenosis and peripheral arterial disease. She has known history of coronary artery disease status post CABG in 1994, essential hypertension, moderate left carotid stenosis, hypothyroidism and hyperlipidemia.  There is remote history of tobacco use. She had worsening angina in 2019 .  She underwent cardiac catheterization which showed severe underlying three-vessel coronary artery disease with patent LIMA to LAD, SVG to diagonal and SVG to left PDA.  There was severe stenosis in SVG to diagonal which was treated successfully with drug-eluting stent placement. She was found to have severe heavily calcified  stenosis in the left subclavian artery likely compromising the flow into the LIMA.  There was a 30 mm gradient and pressure between both arms.   The patient had left subclavian artery stenting in July of 2019.  She was found to have peripheral arterial disease with severe right leg claudication. Angiography performed 10/03/2017 showed heavily calcified abdominal aorta with moderate stenosis distally, significant right renal artery stenosis, significant right common iliac artery stenosis at the ostium with moderate disease distally and moderate left iliac artery disease.  I performed successful stent placement to the ostial right common iliac artery which extended 2 mm into the distal aorta.  She has been doing very well overall with no chest pain, shortness of breath or palpitations.  No left arm or leg claudication.  She takes her medication regularly. Unfortunately, her 564 year old sister died recently after a stroke.    The patient does not have symptoms concerning for COVID-19 infection (fever, chills, cough, or new shortness of breath).    Past Medical History:  Diagnosis Date  . Anemia    hx  . Anxiety   . Arthritis   . Carotid arterial disease (HCC)    a. 04/2015 Carotid U/S: RICA 1-39%, LICA 40-59%, no change.  . Coronary artery disease    a. 1994 s/p CABG;  b. 03/2012 MV: No ischemia/infarct.  . Depression   . Diverticulosis   . Elevated LFTs   . Fibrocystic breast   . GERD (gastroesophageal reflux disease)   . Hemorrhoid   . Hemorrhoids   . History of hemolysis, elevated liver enzymes, and low platelet (HELLP) syndrome, currently pregnant    patient denies history  .  Hyperlipidemia   . Hypertension   . Hypertensive heart disease   . Hypothyroidism   . Insomnia   . Nephrolithiasis   . PONV (postoperative nausea and vomiting)    after heart surgery   Past Surgical History:  Procedure Laterality Date  . ABDOMINAL AORTOGRAM W/LOWER EXTREMITY N/A 10/03/2017   Procedure:  ABDOMINAL AORTOGRAM W/LOWER EXTREMITY;  Surgeon: Iran Ouch, MD;  Location: [facility] INVASIVE CV LAB;  Service: Cardiovascular;  Laterality: N/A;  Bilateral Limited  . AORTIC ARCH ANGIOGRAPHY N/A 07/25/2017   Procedure: AORTIC ARCH ANGIOGRAPHY;  Surgeon: Iran Ouch, MD;  Location: [facility] INVASIVE CV LAB;  Service: Cardiovascular;  Laterality: N/A;  . CARDIAC CATHETERIZATION    . CHOLECYSTECTOMY  2002  . CORONARY ARTERY BYPASS GRAFT  1993  . CORONARY STENT INTERVENTION N/A 04/03/2017   Procedure: CORONARY STENT INTERVENTION;  Surgeon: Corky Crafts, MD;  Location: [facility] INVASIVE CV LAB;  Service: Cardiovascular;  Laterality: N/A;  . EVALUATION UNDER ANESTHESIA WITH HEMORRHOIDECTOMY N/A 03/14/2012   Procedure: EXAM UNDER ANESTHESIA WITH Excisional HEMORRHOIDECTOMY and Hemorrhoidal banding;  Surgeon: Atilano Ina, MD;  Location: [facility];  Service: General;  Laterality: N/A;  x 2 hemorrhoids  . HEMORRHOID SURGERY    . LEFT HEART CATH AND CORS/GRAFTS ANGIOGRAPHY N/A 04/03/2017   Procedure: LEFT HEART CATH AND CORS/GRAFTS ANGIOGRAPHY;  Surgeon: Corky Crafts, MD;  Location: [facility] INVASIVE CV LAB;  Service: Cardiovascular;  Laterality: N/A;  . PERIPHERAL VASCULAR INTERVENTION Left 07/25/2017   Procedure: PERIPHERAL VASCULAR INTERVENTION;  Surgeon: Iran Ouch, MD;  Location: [facility] INVASIVE CV LAB;  Service: Cardiovascular;  Laterality: Left;  Subclavian  . PERIPHERAL VASCULAR INTERVENTION  10/03/2017   Procedure: PERIPHERAL VASCULAR INTERVENTION;  Surgeon: Iran Ouch, MD;  Location: [facility] INVASIVE CV LAB;  Service: Cardiovascular;;  LCIA  . TUBAL LIGATION  1970  . ULTRASOUND GUIDANCE FOR VASCULAR ACCESS  04/03/2017   Procedure: Ultrasound Guidance For Vascular Access;  Surgeon: Corky Crafts, MD;  Location: [facility] INVASIVE CV LAB;  Service: Cardiovascular;;  . UPPER EXTREMITY ANGIOGRAPHY Left 07/25/2017   Procedure: UPPER EXTREMITY ANGIOGRAPHY;  Surgeon: Iran Ouch, MD;  Location: [facility]  INVASIVE CV LAB;  Service: Cardiovascular;  Laterality: Left;     Current Meds  Medication Sig  . ALPRAZolam (XANAX) 1 MG tablet Take 1 mg by mouth at bedtime.   Marland Kitchen amLODipine (NORVASC) 5 MG tablet Take 1 tablet (5 mg total) by mouth daily.  . Aromatic Inhalants (VICKS VAPOINHALER IN) Inhale 1 puff into the lungs daily as needed (congestion).  Marland Kitchen aspirin 81 MG chewable tablet Chew 1 tablet (81 mg total) by mouth daily.  Marland Kitchen bismuth subsalicylate (PEPTO BISMOL) 262 MG/15ML suspension Take 30 mLs by mouth as needed for indigestion.  . clopidogrel (PLAVIX) 75 MG tablet TAKE 1 TABLET ONE TIME DAILY  WITH  BREAKFAST  . ezetimibe (ZETIA) 10 MG tablet TAKE 1 TABLET (10 MG TOTAL) BY MOUTH DAILY.  Marland Kitchen ibuprofen (ADVIL,MOTRIN) 200 MG tablet Take 400 mg by mouth daily as needed for headache or moderate pain.  Marland Kitchen levothyroxine (SYNTHROID, LEVOTHROID) 50 MCG tablet Take 50 mcg by mouth daily before breakfast.   . linaclotide (LINZESS) 290 MCG CAPS capsule Take 290 mcg by mouth daily as needed (constipation).  . metoprolol succinate (TOPROL-XL) 50 MG 24 hr tablet Take 25-50 mg by mouth See admin instructions. Take 25 mg in the morning and 50 mg at night  . nitroGLYCERIN (NITROSTAT) 0.4 MG SL tablet Place 0.4 mg under  the tongue every 5 (five) minutes x 3 doses as needed for chest pain.  . pantoprazole (PROTONIX) 40 MG tablet Take 40 mg by mouth at bedtime.  . quinapril (ACCUPRIL) 20 MG tablet Take 20 mg by mouth 2 (two) times daily.   . rosuvastatin (CRESTOR) 40 MG tablet Take 1 tablet (40 mg total) by mouth daily at 6 PM.  . sertraline (ZOLOFT) 100 MG tablet Take 100 mg by mouth daily.   . Sodium Phosphates (ENEMA RE) Place 1 each rectally 2 (two) times daily. May use an additional 2 times as needed for constipation  . Tetrahydrozoline HCl (VISINE OP) Place 1 drop into both eyes daily as needed (for dry eyes).     Allergies:   Patient has no known allergies.   Social History   Tobacco Use  . Smoking  status: Former Smoker    Packs/day: 1.00    Years: 12.00    Pack years: 12.00    Types: Cigarettes    Quit date: 11/21/1982    Years since quitting: 36.6  . Smokeless tobacco: Never Used  Vaping Use  . Vaping Use: Never used  Substance Use Topics  . Alcohol use: No    Alcohol/week: 0.0 standard drinks  . Drug use: No     Family Hx: The patient's family history includes Diabetes in her daughter; Heart Problems in her son; Heart disease in her brother; Hyperlipidemia in her sister; Hypertension in her sister; Stroke in her sister. There is no history of Colon cancer, Esophageal cancer, Rectal cancer, or Stomach cancer.  ROS:   Please see the history of present illness.     All other systems reviewed and are negative.   Prior CV studies:   The following studies were reviewed today:  Reviewed results of carotid Doppler and lower extremity arterial Doppler from yesterday with the patient.  Labs/Other Tests and Data Reviewed:    EKG:  No ECG reviewed.  Recent Labs: No results found for requested labs within last 8760 hours.   Recent Lipid Panel Lab Results  Component Value Date/Time   CHOL 166 11/05/2017 12:40 PM   CHOL 239 (H) 02/01/2016 01:51 PM   TRIG 145 11/05/2017 12:40 PM   HDL 42 11/05/2017 12:40 PM   HDL 47 02/01/2016 01:51 PM   CHOLHDL 4.0 11/05/2017 12:40 PM   LDLCALC 95 11/05/2017 12:40 PM   LDLCALC 143 (H) 02/01/2016 01:51 PM   LDLDIRECT 90.8 01/08/2014 08:28 AM    Wt Readings from Last 3 Encounters:  07/04/19 131 lb (59.4 kg)  03/05/18 127 lb 8 oz (57.8 kg)  11/02/17 128 lb 4 oz (58.2 kg)     Objective:    Vital Signs:  BP 120/69 (BP Location: Left Arm, Patient Position: Sitting, Cuff Size: Normal)   Pulse 64   Ht 5\' 3"  (1.6 m)   Wt 131 lb (59.4 kg)   BMI 23.21 kg/m    VITAL SIGNS:  reviewed  ASSESSMENT & PLAN:    1.  Significant left subclavian artery stenosis.  Status post successful stent placement .  Patent stent on carotid Doppler from  yesterday.  2.  Coronary artery disease involving graft coronary arteries without angina: Continue dual antiplatelet therapy.    3.  Peripheral arterial disease: Status post right common iliac artery stent placement for severe claudication with resolution of symptoms.  I reviewed vascular studies with her done yesterday which showed normal ABI and patent right iliac stent.  Repeat studies in 1 year.  4.  Essential hypertension: Blood pressure is controlled on current medications.  5.  Hyperlipidemia: The patient has known history of severe hyperlipidemia in spite of maximal dose rosuvastatin.  She did not tolerate Repatha as she had flulike symptoms.  Zetia was added since then and the patient is going to have a physical in August.  I asked her to forward results of labs to our office.   6.  Moderate left carotid stenosis: Stable on most recent carotid Doppler from yesterday.  Repeat study in 1 year.    Time:   Today, I have spent 8 minutes with the patient with telehealth technology discussing the above problems.     Medication Adjustments/Labs and Tests Ordered: Current medicines are reviewed at length with the patient today.  Concerns regarding medicines are outlined above.   Tests Ordered: No orders of the defined types were placed in this encounter.   Medication Changes: No orders of the defined types were placed in this encounter.   Follow Up:  In Person in 6 month(s)  Signed, [name], MD  07/04/2019 8:46 AM    [facility]

## 2019-07-04 NOTE — Patient Instructions (Signed)
Medication Instructions:  Your physician recommends that you continue on your current medications as directed. Please refer to the Current Medication list given to you today.  *If you need a refill on your cardiac medications before your next appointment, please call your pharmacy*   Lab Work: None ordered  If you have labs (blood work) drawn today and your tests are completely normal, you will receive your results only by: Marland Kitchen MyChart Message (if you have MyChart) OR . A paper copy in the mail If you have any lab test that is abnormal or we need to change your treatment, we will call you to review the results.   Testing/Procedures: In 1 yr 1- Ultrasound ABI 2- Aorta iliac ultrasound 3- Ultrasound Carotid   Follow-Up: At [facility], you and your health needs are our priority.  As part of our continuing mission to provide you with exceptional heart care, we have created designated Provider Care Teams.  These Care Teams include your primary Cardiologist (physician) and Advanced Practice Providers (APPs -  Physician Assistants and Nurse Practitioners) who all work together to provide you with the care you need, when you need it.  We recommend signing up for the patient portal called "MyChart".  Sign up information is provided on this After Visit Summary.  MyChart is used to connect with patients for Virtual Visits (Telemedicine).  Patients are able to view lab/test results, encounter notes, upcoming appointments, etc.  Non-urgent messages can be sent to your provider as well.   To learn more about what you can do with MyChart, go to [email or web address].    Your next appointment:   6 month(s)  The format for your next appointment:   In Person  Provider:    You may see Kirke Corin or one of the following Advanced Practice Providers on your designated Care Team:    Nicolasa Ducking, NP  Eula Listen, PA-C  Marisue Ivan, PA-C

## 2019-07-08 ENCOUNTER — Other Ambulatory Visit: Payer: Self-pay | Admitting: Cardiology

## 2019-09-11 ENCOUNTER — Other Ambulatory Visit: Payer: Self-pay | Admitting: Cardiovascular Disease

## 2019-09-30 ENCOUNTER — Other Ambulatory Visit: Payer: Self-pay | Admitting: Cardiology

## 2019-10-23 DIAGNOSIS — E039 Hypothyroidism, unspecified: Secondary | ICD-10-CM | POA: Diagnosis not present

## 2019-10-23 DIAGNOSIS — Z0000 Encounter for general adult medical examination without abnormal findings: Secondary | ICD-10-CM | POA: Diagnosis not present

## 2019-10-23 DIAGNOSIS — I739 Peripheral vascular disease, unspecified: Secondary | ICD-10-CM | POA: Diagnosis not present

## 2019-10-23 DIAGNOSIS — I10 Essential (primary) hypertension: Secondary | ICD-10-CM | POA: Diagnosis not present

## 2019-10-23 DIAGNOSIS — R109 Unspecified abdominal pain: Secondary | ICD-10-CM | POA: Diagnosis not present

## 2019-10-23 DIAGNOSIS — E782 Mixed hyperlipidemia: Secondary | ICD-10-CM | POA: Diagnosis not present

## 2019-10-23 DIAGNOSIS — I2510 Atherosclerotic heart disease of native coronary artery without angina pectoris: Secondary | ICD-10-CM | POA: Diagnosis not present

## 2019-10-23 DIAGNOSIS — D696 Thrombocytopenia, unspecified: Secondary | ICD-10-CM | POA: Diagnosis not present

## 2019-10-23 DIAGNOSIS — I6522 Occlusion and stenosis of left carotid artery: Secondary | ICD-10-CM | POA: Diagnosis not present

## 2019-10-23 DIAGNOSIS — F411 Generalized anxiety disorder: Secondary | ICD-10-CM | POA: Diagnosis not present

## 2019-11-23 ENCOUNTER — Other Ambulatory Visit: Payer: Self-pay | Admitting: Cardiovascular Disease

## 2019-12-12 ENCOUNTER — Other Ambulatory Visit: Payer: Self-pay | Admitting: Cardiovascular Disease

## 2019-12-12 NOTE — Telephone Encounter (Signed)
Please schedule 6 month F/U appointment. Thank you! 

## 2019-12-12 NOTE — Telephone Encounter (Signed)
LVM for patient to call and schedule

## 2020-02-05 ENCOUNTER — Ambulatory Visit: Payer: Medicare HMO | Admitting: Cardiovascular Disease

## 2020-02-10 ENCOUNTER — Other Ambulatory Visit: Payer: Self-pay | Admitting: Cardiovascular Disease

## 2020-03-10 ENCOUNTER — Telehealth: Payer: Self-pay | Admitting: Cardiovascular Disease

## 2020-03-10 NOTE — Telephone Encounter (Signed)
Spoke with the patient. Patient sts that she has a lot going on and would like to rescheduled her 03/11/20 appt with Dr. Kirke Corin. Patient sts that she feels that she is doing ok from a cardiac standpoint. Appt rescheduled for 05/06/20 @ 3pm. Patient is aware of the new appt date and time. She will contact the office sooner if cardiac symptoms develop.

## 2020-03-10 NOTE — Telephone Encounter (Signed)
Dawn Boyer is calling wanting to know if she can still come to her appointment that is scheduled for tomorrow due to having chills and fatigue. She states this has been an ongoing occurrence since she hurt her back 3 months ago. She does not feel she has a cold or anything like that and does not report any further symptoms. She has not taken her temperature in order to know if she has or has had a fever. Please advise.

## 2020-03-11 ENCOUNTER — Ambulatory Visit: Payer: Medicare HMO | Admitting: Cardiovascular Disease

## 2020-03-11 DIAGNOSIS — R110 Nausea: Secondary | ICD-10-CM | POA: Diagnosis not present

## 2020-03-11 DIAGNOSIS — R634 Abnormal weight loss: Secondary | ICD-10-CM | POA: Diagnosis not present

## 2020-03-11 DIAGNOSIS — R5381 Other malaise: Secondary | ICD-10-CM | POA: Diagnosis not present

## 2020-03-11 DIAGNOSIS — R0989 Other specified symptoms and signs involving the circulatory and respiratory systems: Secondary | ICD-10-CM | POA: Diagnosis not present

## 2020-03-12 ENCOUNTER — Other Ambulatory Visit: Payer: Self-pay | Admitting: Family Medicine

## 2020-03-12 ENCOUNTER — Ambulatory Visit
Admission: RE | Admit: 2020-03-12 | Discharge: 2020-03-12 | Disposition: A | Discharge status: Home / Self Care | Payer: Medicare HMO | Source: Ambulatory Visit | Attending: Family Medicine | Admitting: Family Medicine

## 2020-03-12 DIAGNOSIS — Z951 Presence of aortocoronary bypass graft: Secondary | ICD-10-CM | POA: Diagnosis not present

## 2020-03-12 DIAGNOSIS — I700 Atherosclerosis of aorta: Secondary | ICD-10-CM | POA: Diagnosis not present

## 2020-03-12 DIAGNOSIS — R0989 Other specified symptoms and signs involving the circulatory and respiratory systems: Secondary | ICD-10-CM

## 2020-03-12 DIAGNOSIS — R918 Other nonspecific abnormal finding of lung field: Secondary | ICD-10-CM | POA: Diagnosis not present

## 2020-03-18 ENCOUNTER — Other Ambulatory Visit: Payer: Self-pay | Admitting: Family Medicine

## 2020-03-18 DIAGNOSIS — R634 Abnormal weight loss: Secondary | ICD-10-CM

## 2020-03-18 DIAGNOSIS — R5381 Other malaise: Secondary | ICD-10-CM

## 2020-03-18 DIAGNOSIS — R7989 Other specified abnormal findings of blood chemistry: Secondary | ICD-10-CM

## 2020-03-18 DIAGNOSIS — D709 Neutropenia, unspecified: Secondary | ICD-10-CM

## 2020-03-18 DIAGNOSIS — R110 Nausea: Secondary | ICD-10-CM

## 2020-03-18 DIAGNOSIS — R5383 Other fatigue: Secondary | ICD-10-CM

## 2020-03-19 ENCOUNTER — Other Ambulatory Visit: Payer: Self-pay

## 2020-03-19 ENCOUNTER — Ambulatory Visit
Admission: RE | Admit: 2020-03-19 | Discharge: 2020-03-19 | Disposition: A | Discharge status: Home / Self Care | Payer: Medicare HMO | Source: Ambulatory Visit | Attending: Family Medicine | Admitting: Family Medicine

## 2020-03-19 DIAGNOSIS — D709 Neutropenia, unspecified: Secondary | ICD-10-CM

## 2020-03-19 DIAGNOSIS — R7989 Other specified abnormal findings of blood chemistry: Secondary | ICD-10-CM

## 2020-03-19 DIAGNOSIS — R5381 Other malaise: Secondary | ICD-10-CM

## 2020-03-19 DIAGNOSIS — R5383 Other fatigue: Secondary | ICD-10-CM

## 2020-03-19 DIAGNOSIS — N281 Cyst of kidney, acquired: Secondary | ICD-10-CM | POA: Diagnosis not present

## 2020-03-19 DIAGNOSIS — R634 Abnormal weight loss: Secondary | ICD-10-CM

## 2020-03-19 DIAGNOSIS — R110 Nausea: Secondary | ICD-10-CM

## 2020-03-19 MED ORDER — IOPAMIDOL (ISOVUE-300) INJECTION 61%
100.0000 mL | Freq: Once | INTRAVENOUS | Status: AC | PRN
  Administered 2020-03-19: 100 mL via INTRAVENOUS

## 2020-03-26 ENCOUNTER — Encounter: Payer: Self-pay | Admitting: Gastroenterology

## 2020-04-13 ENCOUNTER — Ambulatory Visit: Payer: Medicare HMO | Admitting: Gastroenterology

## 2020-05-06 ENCOUNTER — Encounter: Payer: Self-pay | Admitting: Cardiovascular Disease

## 2020-05-06 ENCOUNTER — Other Ambulatory Visit: Payer: Self-pay

## 2020-05-06 ENCOUNTER — Ambulatory Visit: Payer: Medicare HMO | Admitting: Cardiovascular Disease

## 2020-05-06 VITALS — BP 120/86 | HR 69 | Ht 60.0 in | Wt 120.0 lb

## 2020-05-06 DIAGNOSIS — E785 Hyperlipidemia, unspecified: Secondary | ICD-10-CM

## 2020-05-06 DIAGNOSIS — I6522 Occlusion and stenosis of left carotid artery: Secondary | ICD-10-CM | POA: Diagnosis not present

## 2020-05-06 DIAGNOSIS — I2510 Atherosclerotic heart disease of native coronary artery without angina pectoris: Secondary | ICD-10-CM | POA: Diagnosis not present

## 2020-05-06 DIAGNOSIS — I771 Stricture of artery: Secondary | ICD-10-CM | POA: Diagnosis not present

## 2020-05-06 DIAGNOSIS — I739 Peripheral vascular disease, unspecified: Secondary | ICD-10-CM | POA: Diagnosis not present

## 2020-05-06 DIAGNOSIS — I10 Essential (primary) hypertension: Secondary | ICD-10-CM | POA: Diagnosis not present

## 2020-05-06 NOTE — Progress Notes (Signed)
Cardiology Office Note   Date:  05/06/2020   ID:  Dawn Boyer, DOB 01-29-1945, MRN [id number]  PCP:  [name], MD  Cardiologist:  Dr. Eldridge Dace, [name], MD   Chief Complaint  Patient presents with  . Other    Past due 6 month follow up. Meds reviewed verbally with patient,.       History of Present Illness: Dawn Boyer is a 75 y.o. female who is here today for a follow up visit regarding left subclavian artery stenosis and peripheral arterial disease. She has known history of coronary artery disease status post CABG in 1994, essential hypertension, moderate left carotid stenosis, hypothyroidism and hyperlipidemia.  There is remote history of tobacco use. She had worsening angina in 2019. She underwent cardiac catheterization which showed severe underlying three-vessel coronary artery disease with patent LIMA to LAD, SVG to diagonal and SVG to left PDA.  There was severe stenosis in SVG to diagonal which was treated successfully with drug-eluting stent placement. She was found to have severe heavily calcified stenosis in the left subclavian artery likely compromising the flow into the LIMA.  The patient had left subclavian artery stenting in July of 2019.  She was subsequently found to have peripheral arterial disease with severe right leg claudication. Angiography performed 10/03/2017 showed heavily calcified abdominal aorta with moderate stenosis distally, significant right renal artery stenosis, significant right common iliac artery stenosis at the ostium with moderate disease distally and moderate left iliac artery disease.  I performed successful stent placement to the ostial right common iliac artery which extended 2 mm into the distal aorta.  She has been doing well with no recent chest pain, shortness of breath, lower extremity claudication or left arm claudication.     Past Medical History:  Diagnosis Date  . Anemia    hx  . Anxiety   . Arthritis   . Carotid  arterial disease (HCC)    a. 04/2015 Carotid U/S: RICA 1-39%, LICA 40-59%, no change.  . Coronary artery disease    a. 1994 s/p CABG;  b. 03/2012 MV: No ischemia/infarct.  . Depression   . Diverticulosis   . Elevated LFTs   . Fibrocystic breast   . GERD (gastroesophageal reflux disease)   . Hemorrhoid   . Hemorrhoids   . History of hemolysis, elevated liver enzymes, and low platelet (HELLP) syndrome, currently pregnant    patient denies history  . Hyperlipidemia   . Hypertension   . Hypertensive heart disease   . Hypothyroidism   . Insomnia   . Nephrolithiasis   . PONV (postoperative nausea and vomiting)    after heart surgery    Past Surgical History:  Procedure Laterality Date  . ABDOMINAL AORTOGRAM W/LOWER EXTREMITY N/A 10/03/2017   Procedure: ABDOMINAL AORTOGRAM W/LOWER EXTREMITY;  Surgeon: Iran Ouch, MD;  Location: [facility] INVASIVE CV LAB;  Service: Cardiovascular;  Laterality: N/A;  Bilateral Limited  . AORTIC ARCH ANGIOGRAPHY N/A 07/25/2017   Procedure: AORTIC ARCH ANGIOGRAPHY;  Surgeon: Iran Ouch, MD;  Location: [facility] CV [facility];  Service: Cardiovascular;  Laterality: N/A;  . CARDIAC CATHETERIZATION    . CHOLECYSTECTOMY  2002  . CORONARY ARTERY BYPASS GRAFT  1993  . CORONARY STENT INTERVENTION N/A 04/03/2017   Procedure: CORONARY STENT INTERVENTION;  Surgeon: Corky Crafts, MD;  Location: [facility] INVASIVE CV LAB;  Service: Cardiovascular;  Laterality: N/A;  . EVALUATION UNDER ANESTHESIA WITH HEMORRHOIDECTOMY N/A 03/14/2012   Procedure: EXAM UNDER ANESTHESIA WITH Excisional  HEMORRHOIDECTOMY and Hemorrhoidal banding;  Surgeon: Atilano Ina, MD;  Location: [facility] OR;  Service: General;  Laterality: N/A;  x 2 hemorrhoids  . HEMORRHOID SURGERY    . LEFT HEART CATH AND CORS/GRAFTS ANGIOGRAPHY N/A 04/03/2017   Procedure: LEFT HEART CATH AND CORS/GRAFTS ANGIOGRAPHY;  Surgeon: Corky Crafts, MD;  Location: [facility] INVASIVE CV LAB;  Service: Cardiovascular;  Laterality: N/A;   . PERIPHERAL VASCULAR INTERVENTION Left 07/25/2017   Procedure: PERIPHERAL VASCULAR INTERVENTION;  Surgeon: Iran Ouch, MD;  Location: [facility] INVAS[facility] CV LAB;  Service: Cardiovascular;  Laterality: Left;  Subclavian  . PERIPHERAL VASCULAR INTERVENTION  10/03/2017   Procedure: PERIPHERAL VASCULAR INTERVENTION;  Surgeon: Iran Ouch, MD;  Location: [facility] INVASIVE CV LAB;  Service: Cardiovascular;;  LCIA  . TUBAL LIGATION  1970  . ULTRASOUND GUIDANCE FOR VASCULAR ACCESS  04/03/2017   Procedure: Ultrasound Guidance For Vascular Access;  Surgeon: Corky Crafts, MD;  Location[facility] INVASIVE CV LAB;  Service: Cardiovascular;;  . UPPER EXTREMITY ANGIOGRAPHY Left 07/25/2017   Procedure: UPPER EXTREMITY ANGIOGRAPHY;  Surgeon: Iran Ouch, MD;  Location: [facility] INVAS[facility] CV LAB;  Service: Cardiovascular;  Laterality: Left;     Current Outpatient Medications  Medication Sig Dispense Refill  . ALPRAZolam (XANAX) 1 MG tablet Take 1 mg by mouth at bedtime.     Marland Kitchen amLODipine (NORVASC) 5 MG tablet Take 1 tablet (5 mg total) by mouth daily. 90 tablet 3  . cholecalciferol (VITAMIN D3) 25 MCG (1000 UNIT) tablet Take 1,000 Units by mouth daily.    . clopidogrel (PLAVIX) 75 MG tablet TAKE 1 TABLET ONE TIME DAILY WITH BREAKFAST (KEEP APPOINTMENT FOR FURTHER REFILLS) 90 tablet 0  . ezetimibe (ZETIA) 10 MG tablet TAKE 1 TABLET EVERY DAY (NEED MD APPOINTMENT FOR REFILLS, PLEASE CALL TO SCHEDULE) 60 tablet 0  . levothyroxine (SYNTHROID, LEVOTHROID) 50 MCG tablet Take 50 mcg by mouth daily before breakfast.     . linaclotide (LINZESS) 290 MCG CAPS capsule Take 290 mcg by mouth daily as needed (constipation).    . metoprolol succinate (TOPROL-XL) 50 MG 24 hr tablet Take 25-50 mg by mouth See admin instructions. Take 25 mg in the morning and 50 mg at night    . nitroGLYCERIN (NITROSTAT) 0.4 MG SL tablet Place 1 tablet (0.4 mg total) under the tongue every 5 (five) minutes x 3 doses as needed for chest pain. 25  tablet 1  . ondansetron (ZOFRAN) 4 MG tablet Take 4 mg by mouth every 8 (eight) hours as needed.    . pantoprazole (PROTONIX) 40 MG tablet Take 40 mg by mouth at bedtime.    . quinapril (ACCUPRIL) 20 MG tablet Take 20 mg by mouth 2 (two) times daily.     . rosuvastatin (CRESTOR) 40 MG tablet Take 1 tablet (40 mg total) by mouth daily at 6 PM.    . sertraline (ZOLOFT) 100 MG tablet Take 100 mg by mouth daily.     . Sodium Phosphates (ENEMA RE) Place 1 each rectally 2 (two) times daily. May use an additional 2 times as needed for constipation    . zinc gluconate 50 MG tablet Take 50 mg by mouth daily.     No current facility-administered medications for this visit.    Allergies:   Patient has no known allergies.    Social History:  The patient  reports that she quit smoking about 37 years ago. Her smoking use included cigarettes. She has a 12.00 pack-year smoking history. She has  never used smokeless tobacco. She reports that she does not drink alcohol and does not use drugs.   Family History:  The patient's family history includes Diabetes in her daughter; Heart Problems in her son; Heart disease in her brother; Hyperlipidemia in her sister; Hypertension in her sister; Stroke in her sister.    ROS:  Please see the history of present illness.   Otherwise, review of systems are positive for none.   All other systems are reviewed and negative.    PHYSICAL EXAM: VS:  BP 120/86 (BP Location: Left Arm, Patient Position: Sitting, Cuff Size: Normal)   Pulse 69   Ht 5' (1.524 m)   Wt 120 lb (54.4 kg)   SpO2 98%   BMI 23.44 kg/m  , BMI Body mass index is 23.44 kg/m. GEN: Well nourished, well developed, in no acute distress  HEENT: normal  Neck: no JVD.  Bilateral carotid bruits and a bruit in the left subclavian area. Cardiac: RRR; no  rubs, or gallops,no edema.  2 out of 6 early peaking systolic murmur in the aortic area Respiratory:  clear to auscultation bilaterally, normal work of  breathing GI: soft, nontender, nondistended, + BS MS: no deformity or atrophy  Skin: warm and dry, no rash Neuro:  Strength and sensation are intact Psych: euthymic mood, full affect Vascular: Normal radial pulse bilaterally.  Distal pedal pulses are palpable bilaterally.   EKG:  EKG is ordered today.  EKG showed normal sinus rhythm with anterolateral T wave changes suggestive of ischemia.   Recent Labs: No results found for requested labs within last 8760 hours.    Lipid Panel    Component Value Date/Time   CHOL 166 11/05/2017 1240   CHOL 239 (H) 02/01/2016 1351   TRIG 145 11/05/2017 1240   HDL 42 11/05/2017 1240   HDL 47 02/01/2016 1351   CHOLHDL 4.0 11/05/2017 1240   VLDL 29 11/05/2017 1240   LDLCALC 95 11/05/2017 1240   LDLCALC 143 (H) 02/01/2016 1351   LDLDIRECT 90.8 01/08/2014 0828      Wt Readings from Last 3 Encounters:  05/06/20 120 lb (54.4 kg)  07/04/19 131 lb (59.4 kg)  03/05/18 127 lb 8 oz (57.8 kg)        PAD Screen 04/10/2017  Previous PAD dx? Yes  Previous surgical procedure? No  Pain with walking? Yes  Subsides with rest? Yes  Feet/toe relief with dangling? No  Painful, non-healing ulcers? No  Extremities discolored? No      ASSESSMENT AND PLAN:  1.  Significant left subclavian artery stenosis.  Status post successful stent placement .  Most recent Doppler in July of last year showed patent left subclavian artery.  Repeat study in July of this year.  2.  Coronary artery disease involving graft coronary arteries without angina: Continue dual antiplatelet therapy.    3.  Peripheral arterial disease: Status post right common iliac artery stent placement for severe claudication with resolution of symptoms.  Patent stent on Doppler in July.  Repeat in July of this year.  4.  Essential hypertension: Blood pressure is controlled on current medications.  5.  Hyperlipidemia: Continue high-dose rosuvastatin and Zetia.  She reports having labs done  with her primary care physician which will be requested.  6.  Moderate left carotid stenosis: Stable on most recent carotid Doppler last year.  Repeat study in July.  7.  Aortic sclerosis murmur: Continue to monitor for now and consider an echocardiogram next year.   Disposition:  FU with me in 6 months    Signed, Lorine BearsMuhammad Dahlia Nifong, MD 05/06/20 [facility] [place] [phone]

## 2020-05-06 NOTE — Patient Instructions (Signed)
Medication Instructions:  Your physician recommends that you continue on your current medications as directed. Please refer to the Current Medication list given to you today.  *If you need a refill on your cardiac medications before your next appointment, please call your pharmacy*   Lab Work: None ordered If you have labs (blood work) drawn today and your tests are completely normal, you will receive your results only by: Marland Kitchen MyChart Message (if you have MyChart) OR . A paper copy in the mail If you have any lab test that is abnormal or we need to change your treatment, we will call you to review the results.   Testing/Procedures: You will need your vascular testing scheduled in July 2022.   Follow-Up: At [facility], you and your health needs are our priority.  As part of our continuing mission to provide you with exceptional heart care, we have created designated Provider Care Teams.  These Care Teams include your primary Cardiologist (physician) and Advanced Practice Providers (APPs -  Physician Assistants and Nurse Practitioners) who all work together to provide you with the care you need, when you need it.  We recommend signing up for the patient portal called "MyChart".  Sign up information is provided on this After Visit Summary.  MyChart is used to connect with patients for Virtual Visits (Telemedicine).  Patients are able to view lab/test results, encounter notes, upcoming appointments, etc.  Non-urgent messages can be sent to your provider as well.   To learn more about what you can do with MyChart, go to [email or web address].    Your next appointment:   Your physician wants you to follow-up in: 6 months You will receive a reminder letter in the mail two months in advance. If you don't receive a letter, please call our office to schedule the follow-up appointment.   The format for your next appointment:   In Person  Provider:   You may see [name], MD or one of  the following Advanced Practice Providers on your designated Care Team:    Nicolasa Ducking, NP  Eula Listen, PA-C  Marisue Ivan, PA-C  [place], [place]  Gillian Shields, NP    Other Instructions N/A

## 2020-06-22 ENCOUNTER — Other Ambulatory Visit: Payer: Self-pay | Admitting: Cardiovascular Disease

## 2020-07-27 ENCOUNTER — Other Ambulatory Visit: Payer: Self-pay | Admitting: Cardiovascular Disease

## 2020-07-27 DIAGNOSIS — I739 Peripheral vascular disease, unspecified: Secondary | ICD-10-CM

## 2020-07-27 DIAGNOSIS — I6523 Occlusion and stenosis of bilateral carotid arteries: Secondary | ICD-10-CM

## 2020-07-30 ENCOUNTER — Ambulatory Visit (INDEPENDENT_AMBULATORY_CARE_PROVIDER_SITE_OTHER): Payer: Medicare HMO

## 2020-07-30 ENCOUNTER — Other Ambulatory Visit: Payer: Self-pay

## 2020-07-30 ENCOUNTER — Other Ambulatory Visit: Payer: Self-pay | Admitting: Cardiovascular Disease

## 2020-07-30 DIAGNOSIS — I6523 Occlusion and stenosis of bilateral carotid arteries: Secondary | ICD-10-CM

## 2020-07-30 DIAGNOSIS — I739 Peripheral vascular disease, unspecified: Secondary | ICD-10-CM

## 2020-07-30 DIAGNOSIS — Z95820 Peripheral vascular angioplasty status with implants and grafts: Secondary | ICD-10-CM | POA: Diagnosis not present

## 2020-08-03 ENCOUNTER — Telehealth: Payer: Self-pay

## 2020-08-03 DIAGNOSIS — I6523 Occlusion and stenosis of bilateral carotid arteries: Secondary | ICD-10-CM

## 2020-08-03 DIAGNOSIS — I739 Peripheral vascular disease, unspecified: Secondary | ICD-10-CM

## 2020-08-03 NOTE — Telephone Encounter (Signed)
-----   Message from Iran Ouch, MD sent at 08/02/2020  5:45 PM EDT ----- Normal ABI and patent right iliac stent.  Repeat studies in 1 year.

## 2020-08-03 NOTE — Telephone Encounter (Signed)
Patient made aware of results with verbalized understanding. Orders placed for 1 yr repeat testing.

## 2020-08-03 NOTE — Telephone Encounter (Signed)
Iran Ouch, MD  08/02/2020  5:46 PM EDT Back to Top     Stable moderate carotid disease.  Repeat study in 1 year.

## 2020-08-03 NOTE — Telephone Encounter (Signed)
Patient returning call.

## 2020-08-03 NOTE — Telephone Encounter (Signed)
Called to give the patient results. Lmtcb. 

## 2020-10-06 ENCOUNTER — Other Ambulatory Visit: Payer: Self-pay | Admitting: Cardiovascular Disease

## 2020-10-16 ENCOUNTER — Other Ambulatory Visit: Payer: Self-pay | Admitting: Cardiovascular Disease

## 2020-10-27 DIAGNOSIS — D696 Thrombocytopenia, unspecified: Secondary | ICD-10-CM | POA: Diagnosis not present

## 2020-10-27 DIAGNOSIS — Z0000 Encounter for general adult medical examination without abnormal findings: Secondary | ICD-10-CM | POA: Diagnosis not present

## 2020-10-27 DIAGNOSIS — I739 Peripheral vascular disease, unspecified: Secondary | ICD-10-CM | POA: Diagnosis not present

## 2020-10-27 DIAGNOSIS — I10 Essential (primary) hypertension: Secondary | ICD-10-CM | POA: Diagnosis not present

## 2020-10-27 DIAGNOSIS — E782 Mixed hyperlipidemia: Secondary | ICD-10-CM | POA: Diagnosis not present

## 2020-10-27 DIAGNOSIS — I6522 Occlusion and stenosis of left carotid artery: Secondary | ICD-10-CM | POA: Diagnosis not present

## 2020-10-27 DIAGNOSIS — I2510 Atherosclerotic heart disease of native coronary artery without angina pectoris: Secondary | ICD-10-CM | POA: Diagnosis not present

## 2020-10-27 DIAGNOSIS — F411 Generalized anxiety disorder: Secondary | ICD-10-CM | POA: Diagnosis not present

## 2020-10-27 DIAGNOSIS — E039 Hypothyroidism, unspecified: Secondary | ICD-10-CM | POA: Diagnosis not present

## 2021-06-02 ENCOUNTER — Other Ambulatory Visit: Payer: Self-pay | Admitting: Cardiovascular Disease

## 2021-06-03 ENCOUNTER — Encounter: Payer: Self-pay | Admitting: *Deleted

## 2021-06-03 NOTE — Telephone Encounter (Signed)
Appointment 6/27 at 1:55

## 2021-06-03 NOTE — Telephone Encounter (Signed)
Please contact pt for future appointment. Pt overdue for 6 month f/u.  

## 2021-06-06 DIAGNOSIS — E559 Vitamin D deficiency, unspecified: Secondary | ICD-10-CM | POA: Diagnosis not present

## 2021-06-06 DIAGNOSIS — E039 Hypothyroidism, unspecified: Secondary | ICD-10-CM | POA: Diagnosis not present

## 2021-06-06 DIAGNOSIS — E782 Mixed hyperlipidemia: Secondary | ICD-10-CM | POA: Diagnosis not present

## 2021-06-06 DIAGNOSIS — Z862 Personal history of diseases of the blood and blood-forming organs and certain disorders involving the immune mechanism: Secondary | ICD-10-CM | POA: Diagnosis not present

## 2021-06-06 DIAGNOSIS — I2510 Atherosclerotic heart disease of native coronary artery without angina pectoris: Secondary | ICD-10-CM | POA: Diagnosis not present

## 2021-06-06 DIAGNOSIS — I700 Atherosclerosis of aorta: Secondary | ICD-10-CM | POA: Diagnosis not present

## 2021-06-06 DIAGNOSIS — I10 Essential (primary) hypertension: Secondary | ICD-10-CM | POA: Diagnosis not present

## 2021-06-06 DIAGNOSIS — R0602 Shortness of breath: Secondary | ICD-10-CM | POA: Diagnosis not present

## 2021-06-06 DIAGNOSIS — R531 Weakness: Secondary | ICD-10-CM | POA: Diagnosis not present

## 2021-06-10 DIAGNOSIS — R0602 Shortness of breath: Secondary | ICD-10-CM | POA: Diagnosis not present

## 2021-06-10 DIAGNOSIS — I10 Essential (primary) hypertension: Secondary | ICD-10-CM | POA: Diagnosis not present

## 2021-06-10 NOTE — ED Provider Triage Note (Signed)
Emergency Medicine Provider Triage Evaluation Note  Dawn Boyer , a 76 y.o. female  was evaluated in triage.  Pt complains of SOB.  Intermittent SOB x2 years but worse today.  Also having some neck pain.  Did take SL NTG and xanax with relief of symptoms afterwards-- thinks it was the SL NTG that helped.  Hx of similar symptoms in the past prior to receiving stents.  Hypertensive with EMS.  Took 325 ASA prior to EMS arrival.  Also on plavix.  Review of Systems  Positive: SOB Negative: fever  Physical Exam  There were no vitals taken for this visit.  Gen:   Awake, no distress   Resp:  Normal effort  MSK:   Moves extremities without difficulty  Other:    Medical Decision Making  Medically screening exam initiated at 11:57 PM.  Appropriate orders placed.  Dawn Boyer was informed that the remainder of the evaluation will be completed by another provider, this initial triage assessment does not replace that evaluation, and the importance of remaining in the ED until their evaluation is complete.  SOB, neck pain.  Hypertensive with EMS, already had 325 ASA.  EKG, labs, CXR.

## 2021-06-10 NOTE — ED Triage Notes (Signed)
Pt brought to ED with c/o intermittent shortness of breath x2 years that became very severe tonight. Pt took [employer] and Xanax with some relief. Pt also experienced neck pain and shoulder pain/ Pt states these symptoms feels similar to pain that lead up to heart surgery. Pt states this is the first time she has taken nitro in 26 years.    EMS Interventions 18g LAC 324mg  ASA taken by pt PTA    EMS Vitals  SBP 188/96 HR 88 SPO2 97% RA  CBG 138

## 2021-06-11 ENCOUNTER — Encounter (HOSPITAL_COMMUNITY): Payer: Self-pay | Admitting: Emergency Medicine

## 2021-06-11 ENCOUNTER — Emergency Department (HOSPITAL_COMMUNITY): Payer: Medicare HMO

## 2021-06-11 ENCOUNTER — Other Ambulatory Visit: Payer: Self-pay

## 2021-06-11 ENCOUNTER — Inpatient Hospital Stay (HOSPITAL_COMMUNITY)
Admission: EM | Admit: 2021-06-11 | Discharge: 2021-06-14 | DRG: 287 | Disposition: A | Discharge status: Home / Self Care | Payer: Medicare HMO | Attending: Cardiology | Admitting: Cardiology

## 2021-06-11 DIAGNOSIS — I350 Nonrheumatic aortic (valve) stenosis: Secondary | ICD-10-CM

## 2021-06-11 DIAGNOSIS — R0602 Shortness of breath: Principal | ICD-10-CM

## 2021-06-11 DIAGNOSIS — I200 Unstable angina: Secondary | ICD-10-CM | POA: Diagnosis not present

## 2021-06-11 DIAGNOSIS — I10 Essential (primary) hypertension: Secondary | ICD-10-CM | POA: Diagnosis not present

## 2021-06-11 DIAGNOSIS — Z823 Family history of stroke: Secondary | ICD-10-CM

## 2021-06-11 DIAGNOSIS — E039 Hypothyroidism, unspecified: Secondary | ICD-10-CM | POA: Diagnosis present

## 2021-06-11 DIAGNOSIS — Z8249 Family history of ischemic heart disease and other diseases of the circulatory system: Secondary | ICD-10-CM

## 2021-06-11 DIAGNOSIS — R079 Chest pain, unspecified: Secondary | ICD-10-CM | POA: Diagnosis not present

## 2021-06-11 DIAGNOSIS — I6522 Occlusion and stenosis of left carotid artery: Secondary | ICD-10-CM | POA: Diagnosis not present

## 2021-06-11 DIAGNOSIS — E785 Hyperlipidemia, unspecified: Secondary | ICD-10-CM | POA: Diagnosis not present

## 2021-06-11 DIAGNOSIS — I25710 Atherosclerosis of autologous vein coronary artery bypass graft(s) with unstable angina pectoris: Principal | ICD-10-CM | POA: Diagnosis present

## 2021-06-11 DIAGNOSIS — Z955 Presence of coronary angioplasty implant and graft: Secondary | ICD-10-CM | POA: Diagnosis not present

## 2021-06-11 DIAGNOSIS — I700 Atherosclerosis of aorta: Secondary | ICD-10-CM | POA: Diagnosis not present

## 2021-06-11 DIAGNOSIS — Z83438 Family history of other disorder of lipoprotein metabolism and other lipidemia: Secondary | ICD-10-CM | POA: Diagnosis not present

## 2021-06-11 DIAGNOSIS — I2582 Chronic total occlusion of coronary artery: Secondary | ICD-10-CM | POA: Diagnosis present

## 2021-06-11 DIAGNOSIS — I2510 Atherosclerotic heart disease of native coronary artery without angina pectoris: Secondary | ICD-10-CM | POA: Diagnosis not present

## 2021-06-11 DIAGNOSIS — Z79899 Other long term (current) drug therapy: Secondary | ICD-10-CM

## 2021-06-11 DIAGNOSIS — I779 Disorder of arteries and arterioles, unspecified: Secondary | ICD-10-CM | POA: Diagnosis present

## 2021-06-11 DIAGNOSIS — I25110 Atherosclerotic heart disease of native coronary artery with unstable angina pectoris: Secondary | ICD-10-CM | POA: Diagnosis present

## 2021-06-11 DIAGNOSIS — I249 Acute ischemic heart disease, unspecified: Secondary | ICD-10-CM | POA: Diagnosis present

## 2021-06-11 DIAGNOSIS — I701 Atherosclerosis of renal artery: Secondary | ICD-10-CM | POA: Diagnosis present

## 2021-06-11 DIAGNOSIS — Z833 Family history of diabetes mellitus: Secondary | ICD-10-CM | POA: Diagnosis not present

## 2021-06-11 LAB — CBC WITH DIFFERENTIAL/PLATELET
Abs Immature Granulocytes: 0.02 10*3/uL (ref 0.00–0.07)
Basophils Absolute: 0 10*3/uL (ref 0.0–0.1)
Basophils Relative: 0 %
Eosinophils Absolute: 0.1 10*3/uL (ref 0.0–0.5)
Eosinophils Relative: 3 %
HCT: 32.5 % — ABNORMAL LOW (ref 36.0–46.0)
Hemoglobin: 10.7 g/dL — ABNORMAL LOW (ref 12.0–15.0)
Immature Granulocytes: 0 %
Lymphocytes Relative: 21 %
Lymphs Abs: 1.1 10*3/uL (ref 0.7–4.0)
MCH: 31.4 pg (ref 26.0–34.0)
MCHC: 32.9 g/dL (ref 30.0–36.0)
MCV: 95.3 fL (ref 80.0–100.0)
Monocytes Absolute: 0.4 10*3/uL (ref 0.1–1.0)
Monocytes Relative: 7 %
Neutro Abs: 3.5 10*3/uL (ref 1.7–7.7)
Neutrophils Relative %: 69 %
Platelets: 142 10*3/uL — ABNORMAL LOW (ref 150–400)
RBC: 3.41 MIL/uL — ABNORMAL LOW (ref 3.87–5.11)
RDW: 12.7 % (ref 11.5–15.5)
WBC: 5.1 10*3/uL (ref 4.0–10.5)
nRBC: 0 % (ref 0.0–0.2)

## 2021-06-11 LAB — BASIC METABOLIC PANEL
Anion gap: 7 (ref 5–15)
Anion gap: 9 (ref 5–15)
BUN: 12 mg/dL (ref 8–23)
BUN: 17 mg/dL (ref 8–23)
CO2: 24 mmol/L (ref 22–32)
CO2: 26 mmol/L (ref 22–32)
Calcium: 9.1 mg/dL (ref 8.9–10.3)
Calcium: 9.2 mg/dL (ref 8.9–10.3)
Chloride: 106 mmol/L (ref 98–111)
Chloride: 107 mmol/L (ref 98–111)
Creatinine, Ser: 0.74 mg/dL (ref 0.44–1.00)
Creatinine, Ser: 0.78 mg/dL (ref 0.44–1.00)
GFR, Estimated: >60 mL/min (ref >60)
GFR, Estimated: >60 mL/min (ref >60)
Glucose, Bld: 120 mg/dL — ABNORMAL HIGH (ref 70–99)
Glucose, Bld: 123 mg/dL — ABNORMAL HIGH (ref 70–99)
Potassium: 3.6 mmol/L (ref 3.5–5.1)
Potassium: 3.8 mmol/L (ref 3.5–5.1)
Sodium: 139 mmol/L (ref 135–145)
Sodium: 140 mmol/L (ref 135–145)

## 2021-06-11 LAB — HEPATIC FUNCTION PANEL
ALT: 16 U/L (ref 0–44)
AST: 29 U/L (ref 15–41)
Albumin: 3.6 g/dL (ref 3.5–5.0)
Alkaline Phosphatase: 71 U/L (ref 38–126)
Bilirubin, Direct: 0.1 mg/dL (ref 0.0–0.2)
Indirect Bilirubin: 0.7 mg/dL (ref 0.3–0.9)
Total Bilirubin: 0.8 mg/dL (ref 0.3–1.2)
Total Protein: 6.8 g/dL (ref 6.5–8.1)

## 2021-06-11 LAB — TROPONIN I (HIGH SENSITIVITY)
Troponin I (High Sensitivity): 45 ng/L — ABNORMAL HIGH (ref <18)
Troponin I (High Sensitivity): 36 ng/L — ABNORMAL HIGH (ref <18)

## 2021-06-11 LAB — BRAIN NATRIURETIC PEPTIDE: B Natriuretic Peptide: 512.2 pg/mL — ABNORMAL HIGH (ref 0.0–100.0)

## 2021-06-11 LAB — HEMOGLOBIN A1C
Hgb A1c MFr Bld: 5.2 % (ref 4.8–5.6)
Mean Plasma Glucose: 102.54 mg/dL

## 2021-06-11 LAB — TSH: TSH: 1.213 u[IU]/mL (ref 0.350–4.500)

## 2021-06-11 LAB — HEPARIN LEVEL (UNFRACTIONATED): Heparin Unfractionated: 0.37 IU/mL (ref 0.30–0.70)

## 2021-06-11 MED ORDER — ZOLPIDEM TARTRATE 5 MG PO TABS: 5.0000 mg | ORAL_TABLET | Freq: Every evening | ORAL | Status: DC | PRN

## 2021-06-11 MED ORDER — SERTRALINE HCL 100 MG PO TABS
100.0000 mg | ORAL_TABLET | Freq: Every day | ORAL | Status: DC
  Administered 2021-06-11 – 2021-06-14 (×4): 100 mg via ORAL
  Filled 2021-06-11 (×4): qty 1

## 2021-06-11 MED ORDER — EZETIMIBE 10 MG PO TABS
10.0000 mg | ORAL_TABLET | Freq: Every day | ORAL | Status: DC
  Administered 2021-06-11 – 2021-06-14 (×4): 10 mg via ORAL
  Filled 2021-06-11 (×4): qty 1

## 2021-06-11 MED ORDER — ONDANSETRON HCL 4 MG/2ML IJ SOLN: 4.0000 mg | Freq: Four times a day (QID) | INTRAMUSCULAR | Status: DC | PRN

## 2021-06-11 MED ORDER — NITROGLYCERIN 2 % TD OINT
0.5000 [in_us] | TOPICAL_OINTMENT | Freq: Four times a day (QID) | TRANSDERMAL | Status: DC
  Filled 2021-06-11: qty 30
  Filled 2021-06-11 (×2): qty 1

## 2021-06-11 MED ORDER — ASPIRIN 300 MG RE SUPP: 300.0000 mg | RECTAL | Status: AC

## 2021-06-11 MED ORDER — ASPIRIN 81 MG PO CHEW
324.0000 mg | CHEWABLE_TABLET | ORAL | Status: AC
  Administered 2021-06-11: 324 mg via ORAL
  Filled 2021-06-11: qty 4

## 2021-06-11 MED ORDER — ROSUVASTATIN CALCIUM 5 MG PO TABS
40.0000 mg | ORAL_TABLET | Freq: Every day | ORAL | Status: DC
  Administered 2021-06-11 – 2021-06-13 (×3): 40 mg via ORAL
  Filled 2021-06-11: qty 8
  Filled 2021-06-11: qty 2
  Filled 2021-06-11: qty 8

## 2021-06-11 MED ORDER — METOPROLOL SUCCINATE ER 50 MG PO TB24
50.0000 mg | ORAL_TABLET | Freq: Two times a day (BID) | ORAL | Status: DC
  Administered 2021-06-11 – 2021-06-14 (×6): 50 mg via ORAL
  Filled 2021-06-11 (×5): qty 1
  Filled 2021-06-11: qty 2
  Filled 2021-06-11: qty 1

## 2021-06-11 MED ORDER — SODIUM CHLORIDE 0.9 % IV SOLN: 250.0000 mL | INTRAVENOUS | Status: DC | PRN

## 2021-06-11 MED ORDER — LEVOTHYROXINE SODIUM 50 MCG PO TABS
50.0000 ug | ORAL_TABLET | Freq: Every day | ORAL | Status: DC
  Administered 2021-06-12 – 2021-06-14 (×2): 50 ug via ORAL
  Filled 2021-06-11 (×2): qty 1

## 2021-06-11 MED ORDER — HEPARIN BOLUS VIA INFUSION
3500.0000 [IU] | Freq: Once | INTRAVENOUS | Status: AC
  Administered 2021-06-11: 3500 [IU] via INTRAVENOUS
  Filled 2021-06-11: qty 3500

## 2021-06-11 MED ORDER — ZINC SULFATE 220 (50 ZN) MG PO CAPS
220.0000 mg | ORAL_CAPSULE | Freq: Every day | ORAL | Status: DC
  Administered 2021-06-11: 220 mg via ORAL
  Filled 2021-06-11 (×2): qty 1

## 2021-06-11 MED ORDER — NITROGLYCERIN 2 % TD OINT
1.0000 [in_us] | TOPICAL_OINTMENT | Freq: Once | TRANSDERMAL | Status: AC
  Administered 2021-06-11: 1 [in_us] via TOPICAL
  Filled 2021-06-11: qty 1

## 2021-06-11 MED ORDER — NITROGLYCERIN 0.4 MG SL SUBL: 0.4000 mg | SUBLINGUAL_TABLET | SUBLINGUAL | Status: DC | PRN

## 2021-06-11 MED ORDER — ENEMA RE ENEM: 1.0000 | ENEMA | Freq: Two times a day (BID) | RECTAL | Status: DC | PRN

## 2021-06-11 MED ORDER — ALPRAZOLAM 0.5 MG PO TABS
1.0000 mg | ORAL_TABLET | Freq: Every day | ORAL | Status: DC
  Administered 2021-06-11 – 2021-06-13 (×3): 1 mg via ORAL
  Filled 2021-06-11 (×3): qty 2

## 2021-06-11 MED ORDER — ALPRAZOLAM 0.25 MG PO TABS: 0.2500 mg | ORAL_TABLET | Freq: Two times a day (BID) | ORAL | Status: DC | PRN

## 2021-06-11 MED ORDER — AMLODIPINE BESYLATE 5 MG PO TABS
5.0000 mg | ORAL_TABLET | Freq: Every day | ORAL | Status: DC
  Administered 2021-06-11 – 2021-06-14 (×2): 5 mg via ORAL
  Filled 2021-06-11 (×3): qty 1

## 2021-06-11 MED ORDER — SODIUM CHLORIDE 0.9% FLUSH: 3.0000 mL | INTRAVENOUS | Status: DC | PRN

## 2021-06-11 MED ORDER — ASCORBIC ACID 500 MG PO TABS
500.0000 mg | ORAL_TABLET | Freq: Every day | ORAL | Status: DC
  Administered 2021-06-11: 500 mg via ORAL
  Filled 2021-06-11 (×2): qty 1

## 2021-06-11 MED ORDER — PANTOPRAZOLE SODIUM 40 MG PO TBEC
40.0000 mg | DELAYED_RELEASE_TABLET | Freq: Every day | ORAL | Status: DC
  Administered 2021-06-11 – 2021-06-13 (×3): 40 mg via ORAL
  Filled 2021-06-11 (×3): qty 1

## 2021-06-11 MED ORDER — HEPARIN (PORCINE) 25000 UT/250ML-% IV SOLN
750.0000 [IU]/h | INTRAVENOUS | Status: DC
  Administered 2021-06-11: 700 [IU]/h via INTRAVENOUS
  Administered 2021-06-12: 750 [IU]/h via INTRAVENOUS
  Filled 2021-06-11 (×2): qty 250

## 2021-06-11 MED ORDER — QUINAPRIL HCL 10 MG PO TABS
20.0000 mg | ORAL_TABLET | Freq: Two times a day (BID) | ORAL | Status: DC
  Administered 2021-06-11 – 2021-06-14 (×7): 20 mg via ORAL
  Filled 2021-06-11 (×9): qty 2

## 2021-06-11 MED ORDER — CLOPIDOGREL BISULFATE 75 MG PO TABS
75.0000 mg | ORAL_TABLET | Freq: Every day | ORAL | Status: DC
  Administered 2021-06-11 – 2021-06-14 (×3): 75 mg via ORAL
  Filled 2021-06-11 (×4): qty 1

## 2021-06-11 MED ORDER — SODIUM CHLORIDE 0.9% FLUSH
3.0000 mL | Freq: Two times a day (BID) | INTRAVENOUS | Status: DC
  Administered 2021-06-11 – 2021-06-12 (×2): 3 mL via INTRAVENOUS

## 2021-06-11 MED ORDER — ACETAMINOPHEN 325 MG PO TABS
650.0000 mg | ORAL_TABLET | ORAL | Status: DC | PRN
  Administered 2021-06-11: 650 mg via ORAL
  Filled 2021-06-11: qty 2

## 2021-06-11 MED ORDER — LINACLOTIDE 145 MCG PO CAPS: 290.0000 ug | ORAL_CAPSULE | Freq: Every day | ORAL | Status: DC | PRN

## 2021-06-11 NOTE — Progress Notes (Signed)
ANTICOAGULATION CONSULT NOTE - Initial Consult  Pharmacy Consult for heparin Indication: chest pain/ACS  No Known Allergies  Patient Measurements:   Heparin Dosing Weight: TBW  Vital Signs: BP: 156/79 (06/10 1540) Pulse Rate: 82 (06/10 1541)  Labs: Recent Labs    06/11/21 0006 06/11/21 0212 06/11/21 0730  HGB 10.7*  --   --   HCT 32.5*  --   --   PLT 142*  --   --   CREATININE 0.78  --   --   TROPONINIHS  --  45* 36*    CrCl cannot be calculated (Unknown ideal weight.).   Medical History: Past Medical History:  Diagnosis Date   Anemia    hx   Anxiety    Arthritis    Carotid arterial disease ([place])    a. 04/2015 Carotid U/S: RICA 123456, LICA 123456, no change.   Coronary artery disease    a. 1994 s/p CABG;  b. 03/2012 MV: No ischemia/infarct. DES SVG-Diag 2019   Depression    Diverticulosis    Elevated LFTs    Fibrocystic breast    GERD (gastroesophageal reflux disease)    Hemorrhoid    Hemorrhoids    History of hemolysis, elevated liver enzymes, and low platelet (HELLP) syndrome, currently pregnant    patient denies history   Hyperlipidemia    Hypertension    Hypertensive heart disease    Hypothyroidism    Insomnia    Nephrolithiasis    PONV (postoperative nausea and vomiting)    after heart surgery    Assessment: 47 YOF presenting with CP and SOB, hx CAD s/p CABG/PCI.  She is not on anticoagulation PTA, plan LHC Monday  Goal of Therapy:  Heparin level 0.3-0.7 units/ml Monitor platelets by anticoagulation protocol: Yes   Plan:  Heparin 3500 units IV x 1, and gtt at 700 units/hr F/u 8 hour heparin level  Bertis Ruddy, PharmD Clinical Pharmacist ED Pharmacist Phone # [phone] 06/11/2021 3:48 PM

## 2021-06-11 NOTE — ED Provider Notes (Signed)
[facility] Provider Note   CSN: [id number] Arrival date & time: 06/11/21  0001     History  Chief Complaint  Patient presents with   Shortness of Breath    [facility] is a 76 y.o. female.   Shortness of Breath Associated symptoms: neck pain   Patient presents for shortness of breath.  History includes hypothyroidism, HLD, depression, hemorrhoids, rectal bleeding, HTN, bilateral carotid disease, CAD, arthritis.  She reports that she has had exertional shortness of breath over the past several weeks.  She describes this as a tightening sensation in her neck, more so than her chest.  Her cardiac history includes CABG in 1994.  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  She continues to take Plavix.  Last night, she experienced worsening tightness and discomfort in her neck.  Her daughter came to her home and called EMS.  She took a 325 mg aspirin.  She also took SL NTG and Xanax.  She reports relief of symptoms approximately 30 minutes after taking these medications.  Currently, at rest, she denies any symptoms.     Home Medications Prior to Admission medications   Medication Sig Start Date End Date Taking? Authorizing Provider  ALPRAZolam Duanne Moron) 1 MG tablet Take 1 mg by mouth at bedtime.  12/09/13  Yes [provider]  amLODipine (NORVASC) 5 MG tablet Take 1 tablet (5 mg total) by mouth daily. 06/03/15  Yes Theora Gianotti, NP  cholecalciferol (VITAMIN D3) 25 MCG (1000 UNIT) tablet Take 1,000 Units by mouth daily. W Vit K ( K2 and D3)   Yes [provider]  clopidogrel (PLAVIX) 75 MG tablet TAKE 1 TABLET EVERY DAY Patient taking differently: Take 75 mg by mouth daily. 06/06/21  Yes Wellington Hampshire, MD  ezetimibe (ZETIA) 10 MG tablet TAKE 1 TABLET EVERY DAY Patient taking differently: Take 10 mg by mouth daily. 06/06/21  Yes Wellington Hampshire, MD  levothyroxine (SYNTHROID, LEVOTHROID) 50 MCG tablet Take 50 mcg by mouth daily before breakfast.    Yes [provider]  linaclotide (LINZESS) 290 MCG CAPS capsule Take 290 mcg by mouth daily as needed (constipation).   Yes [provider]  metoprolol succinate (TOPROL-XL) 50 MG 24 hr tablet Take 25-50 mg by mouth See admin instructions. Take 25 mg in the morning and 50 mg at night   Yes [provider]  nitroGLYCERIN (NITROSTAT) 0.4 MG SL tablet PLACE ONE TABLET UNDER THE TONGUE EVERY 5 MINUTES FOR THREE DOSES AS NEEDED FOR CHEST PAIN Patient taking differently: Place 0.4 mg under the tongue every 5 (five) minutes as needed for chest pain. 10/07/20  Yes Wellington Hampshire, MD  pantoprazole (PROTONIX) 40 MG tablet Take 40 mg by mouth at bedtime.   Yes [provider]  quinapril (ACCUPRIL) 20 MG tablet Take 20 mg by mouth 2 (two) times daily.    Yes [provider]  rosuvastatin (CRESTOR) 40 MG tablet Take 1 tablet (40 mg total) by mouth daily at 6 PM. 04/05/17  Yes Lyda Jester M, PA-C  sertraline (ZOLOFT) 100 MG tablet Take 100 mg by mouth daily.  01/19/17  Yes [provider]  Sodium Phosphates (ENEMA RE) Place 1 each rectally 2 (two) times daily. May use an additional 2 times as needed for constipation   Yes [provider]  vitamin C (ASCORBIC ACID) 500 MG tablet Take  500 mg by mouth daily.   Yes [provider]  zinc gluconate 50 MG tablet Take 50 mg by mouth daily.   Yes [provider]      Allergies    Patient has no known allergies.    Review of Systems   Review of Systems  Respiratory:  Positive for shortness of breath.   Musculoskeletal:  Positive for neck pain.  All other systems reviewed and are negative.   Physical Exam Updated Vital Signs BP (!) 147/78   Pulse 80   Temp 98.1 F (36.7 C) (Oral)   Resp 16   SpO2 96%  Physical Exam Vitals and nursing note reviewed.  Constitutional:       General: She is not in acute distress.    Appearance: She is well-developed and normal weight. She is not ill-appearing, toxic-appearing or diaphoretic.  HENT:     Head: Normocephalic and atraumatic.     Mouth/Throat:     Mouth: Mucous membranes are moist.     Pharynx: Oropharynx is clear.  Eyes:     Extraocular Movements: Extraocular movements intact.     Conjunctiva/sclera: Conjunctivae normal.  Neck:     Vascular: JVD present.  Cardiovascular:     Rate and Rhythm: Normal rate and regular rhythm.     Heart sounds: Murmur heard.  Pulmonary:     Effort: Pulmonary effort is normal. No tachypnea, accessory muscle usage or respiratory distress.     Breath sounds: Normal breath sounds. No decreased breath sounds, wheezing, rhonchi or rales.  Chest:     Chest wall: No tenderness.  Abdominal:     Palpations: Abdomen is soft.     Tenderness: There is no abdominal tenderness.  Musculoskeletal:        General: No swelling. Normal range of motion.     Cervical back: Normal range of motion and neck supple.     Right lower leg: No edema.     Left lower leg: No edema.  Skin:    General: Skin is warm and dry.     Coloration: Skin is not cyanotic or pale.  Neurological:     General: No focal deficit present.     Mental Status: She is alert and oriented to person, place, and time.     Cranial Nerves: No cranial nerve deficit.     Motor: No weakness.  Psychiatric:        Mood and Affect: Mood normal.        Behavior: Behavior normal.     ED Results / Procedures / Treatments   Labs (all labs ordered are listed, but only abnormal results are displayed) Labs Reviewed  CBC WITH DIFFERENTIAL/PLATELET - Abnormal; Notable for the following components:      Result Value   RBC 3.41 (*)    Hemoglobin 10.7 (*)    HCT 32.5 (*)    Platelets 142 (*)    All other components within normal limits  BASIC METABOLIC PANEL - Abnormal; Notable for the following components:   Glucose, Bld 123 (*)     All other components within normal limits  BRAIN NATRIURETIC PEPTIDE - Abnormal; Notable for the following components:   B Natriuretic Peptide 512.2 (*)    All other components within normal limits  TROPONIN I (HIGH SENSITIVITY) - Abnormal; Notable for the following components:   Troponin I (High Sensitivity) 45 (*)    All other components within normal limits  TROPONIN I (HIGH SENSITIVITY) - Abnormal; Notable for  the following components:   Troponin I (High Sensitivity) 36 (*)    All other components within normal limits  HEPARIN LEVEL (UNFRACTIONATED)    EKG EKG Interpretation  Date/Time:  Saturday June 11 2021 08:45:58 EDT Ventricular Rate:  61 PR Interval:  172 QRS Duration: 101 QT Interval:  443 QTC Calculation: 447 R Axis:   24 Text Interpretation: Sinus rhythm Atrial premature complex Repol abnrm suggests ischemia, anterolateral Confirmed by Gloris Manchesterixon, Jeanetta Alonzo ([id number]) on 06/11/2021 8:49:03 AM Also confirmed by Gloris Manchesterixon, Daman Steffenhagen [phone]), editor [address]Anderson, Tamera PuntMiranda ([phone])  on 06/11/2021 9:49:31 AM  Radiology DG Chest 2 View  Result Date: 06/11/2021 CLINICAL DATA:  Chest pain and shortness of breath. EXAM: CHEST - 2 VIEW COMPARISON:  Chest radiograph dated 03/12/2020. FINDINGS: Diffuse chronic interstitial coarsening. No focal consolidation, pleural effusion, pneumothorax. The cardiac silhouette is within limits. Atherosclerotic calcification of the aorta. Median sternotomy wires and CABG vascular clips. No acute osseous pathology. IMPRESSION: No active cardiopulmonary disease. Electronically Signed   By: Elgie CollardArash  Radparvar M.D.   On: 06/11/2021 01:04    Procedures Procedures    Medications Ordered in ED Medications  ALPRAZolam (XANAX) tablet 1 mg (has no administration in time range)  amLODipine (NORVASC) tablet 5 mg (5 mg Oral Given 06/11/21 1357)  clopidogrel (PLAVIX) tablet 75 mg (75 mg Oral Given 06/11/21 1357)  ezetimibe (ZETIA) tablet 10 mg (10 mg Oral Given 06/11/21 1356)  levothyroxine  (SYNTHROID) tablet 50 mcg (has no administration in time range)  linaclotide (LINZESS) capsule 290 mcg (has no administration in time range)  metoprolol succinate (TOPROL-XL) 24 hr tablet 50 mg (50 mg Oral Given 06/11/21 1357)  pantoprazole (PROTONIX) EC tablet 40 mg (has no administration in time range)  quinapril (ACCUPRIL) tablet 20 mg (20 mg Oral Given 06/11/21 1554)  rosuvastatin (CRESTOR) tablet 40 mg (has no administration in time range)  sertraline (ZOLOFT) tablet 100 mg (100 mg Oral Given 06/11/21 1356)  ascorbic acid (VITAMIN C) tablet 500 mg (500 mg Oral Given 06/11/21 1356)  zinc sulfate capsule 220 mg (220 mg Oral Given 06/11/21 1356)  nitroGLYCERIN (NITROGLYN) 2 % ointment 0.5 inch (0.5 inches Topical Not Given 06/11/21 1358)  heparin bolus via infusion 3,500 Units (has no administration in time range)  heparin ADULT infusion 100 units/mL (25000 units/23950mL) (has no administration in time range)  nitroGLYCERIN (NITROGLYN) 2 % ointment 1 inch (1 inch Topical Given 06/11/21 1506)    ED Course/ Medical Decision Making/ A&P                           Medical Decision Making Amount and/or Complexity of Data Reviewed Labs: ordered.  Risk Prescription drug management. Decision regarding hospitalization.   This patient presents to the ED for concern of shortness of breath, this involves an extensive number of treatment options, and is a complaint that carries with it a high risk of complications and morbidity.  The differential diagnosis includes ACS, exacerbation of chronic lung disease, CHF, anxiety, anemia   Co morbidities that complicate the patient evaluation  hypothyroidism, HLD, depression, hemorrhoids, rectal bleeding, HTN, bilateral carotid disease, CAD, arthritis   Additional history obtained:  Additional history obtained from patient's daughter External records from outside source obtained and reviewed including EMR   Lab Tests:  I Ordered, and personally  interpreted labs.  The pertinent results include: Slight drop in hemoglobin when compared to labs from 3 years ago, no leukocytosis, normal electrolytes, mild elevation in troponin, elevated BNP  Imaging Studies ordered:  I ordered imaging studies including chest x-ray I independently visualized and interpreted imaging which showed no acute findings I agree with the radiologist interpretation   Cardiac Monitoring: / EKG:  The patient was maintained on a cardiac monitor.  I personally viewed and interpreted the cardiac monitored which showed an underlying rhythm of: Sinus rhythm   Consultations Obtained:  I requested consultation with the cardiology,  and discussed lab and imaging findings as well as pertinent plan - they recommend: Heparin and admission for planned cath on Monday   Problem List / ED Course / Critical interventions / Medication management  Patient is a 76 year old female with history of CAD s/p CABG in 1994, presenting for which she describes as several weeks of ongoing exertional shortness of breath and discomfort in her neck.  Severity of symptoms increased last night and this prompted a call to EMS.  Prior to arrival, patient did receive 325 mg of ASA.  She took 1 SL NTG and 1 small dose Xanax with subsequent improvement in symptoms.  Prior to being bedded in the ED, patient underwent EKG, chest x-ray, and lab work.  On EKG, patient does have ST segment abnormalities which do appear to be present on prior tracings.  Her troponin is mildly elevated at 45.  On initial assessment, vital signs are notable for hypertension.  She currently denies any symptoms at rest.  NTG was given for possible ACS as well as to lower her blood pressure.  Repeat EKG and repeat troponin to be obtained.  Repeat EKG showed no dynamic changes that are concerning for ischemia.  Repeat troponin was 36.  Given her history of CAD and vasculopathy, cardiology was consulted.  They did evaluate the patient  in the ED and they do recommend admission with initiation of heparin gtt. in anticipation of likely heart cath on Monday.  Patient was admitted for further management. I ordered medication including NTG for hypertension and suspicion of ACS; heparin for ACS Reevaluation of the patient after these medicines showed that the patient improved I have reviewed the patients home medicines and have made adjustments as needed   Social Determinants of Health:  Has access to outpatient care, including cardiology        Final Clinical Impression(s) / ED Diagnoses Final diagnoses:  SOB (shortness of breath)    Rx / DC Orders ED Discharge Orders     None         Godfrey Pick, MD 06/11/21 1634

## 2021-06-11 NOTE — H&P (Addendum)
Cardiology History and Physical:   Patient ID: Dawn Boyer MRN: [id number]; DOB: 1945/06/03  Admit date: 06/11/2021 Date of Consult: 06/11/2021  PCP:  [name], MD   [facility] Providers Cardiologist:  Lance Muss, MD        Patient Profile:   Dawn Boyer is a 76 y.o. female with a hx of CABG 1994, DES SVG-Diag 2019, R-CIA stent 2019, HTN, HLD, hypothyroid, who is being seen 06/11/2021 for the evaluation of chest pain and shortness of breath at the request of Dr. Durwin Nora.  History of Present Illness:   Dawn Boyer has been having problems with exertional shortness of breath and throat tightness for several months.  She felt like she got suddenly worse but then stayed at that level.  Her stress level and anxiety have been worse than usual because they are having to move and her husband has had some health problems.  She also has anxiety about a heart catheterization because she said either during or after the heart catheterization, she had a CODE BLUE.  She has had consistent shortness of breath with exertion this whole time and there has also been associated throat tightness.  Because of the move, she had been busier than usual and yesterday was moving around cans of paint and boxes.  She had the shortness of breath and throat tightness.  When she went to bed last night, the symptoms did not resolve.  She took her usual bedtime Xanax without relief.  After a while she took a sublingual nitroglycerin to see if that would help.  The nitroglycerin helped, but the symptoms got worse in less than 20 minutes.  That is when she decided to come to the emergency room.  In the emergency room, she is resting pretty comfortably, no throat tightness and her O2 sats are 100% on room air.   Past Medical History:  Diagnosis Date   Anemia    hx   Anxiety    Arthritis    Carotid arterial disease (HCC)    a. 04/2015 Carotid U/S: RICA 1-39%, LICA 40-59%, no change.   Coronary artery  disease    a. 1994 s/p CABG;  b. 03/2012 MV: No ischemia/infarct. DES SVG-Diag 2019   Depression    Diverticulosis    Elevated LFTs    Fibrocystic breast    GERD (gastroesophageal reflux disease)    Hemorrhoid    Hemorrhoids    History of hemolysis, elevated liver enzymes, and low platelet (HELLP) syndrome, currently pregnant    patient denies history   Hyperlipidemia    Hypertension    Hypertensive heart disease    Hypothyroidism    Insomnia    Nephrolithiasis    PONV (postoperative nausea and vomiting)    after heart surgery    Past Surgical History:  Procedure Laterality Date   ABDOMINAL AORTOGRAM W/LOWER EXTREMITY N/A 10/03/2017   Procedure: ABDOMINAL AORTOGRAM W/LOWER EXTREMITY;  Surgeon: Iran Ouch, MD;  Location: [facility] INVASIVE CV LAB;  Service: Cardiovascular;  Laterality: N/A;  Bilateral Limited   AORTIC ARCH ANGIOGRAPHY N/A 07/25/2017   Procedure: AORTIC ARCH ANGIOGRAPHY;  Surgeon: Iran Ouch, MD;  Location: [facility] INVAS[facility] CV LAB;  Service: Cardiovascular;  Laterality: N/A;   CARDIAC CATHETERIZATION     CHOLECYSTECTOMY  2002   CORONARY ARTERY BYPASS GRAFT  1993   CORONARY STENT INTERVENTION N/A 04/03/2017   Procedure: CORONARY STENT INTERVENTION;  Surgeon: Corky Crafts, MD;  Location: [facility] INVASIVE CV LAB;  Service: Cardiovascular;  Laterality: N/A;   EVALUATION UNDER ANESTHESIA WITH HEMORRHOIDECTOMY N/A 03/14/2012   Procedure: EXAM UNDER ANESTHESIA WITH Excisional HEMORRHOIDECTOMY and Hemorrhoidal banding;  Surgeon: Atilano InaEric M Wilson, MD;  [facility];  Service: General;  Laterality: N/A;  x 2 hemorrhoids   HEMORRHOID SURGERY     LEFT HEART CATH AND CORS/GRAFTS ANGIOGRAPHY N/A 04/03/2017   Procedure: LEFT HEART CATH AND CORS/GRAFTS ANGIOGRAPHY;  Surgeon: Corky CraftsVaranasi, Jayadeep S, MD;  Location: [facility];  Service: Cardiovascular;  Laterality: N/A;   PERIPHERAL VASCULAR INTERVENTION Left 07/25/2017   Procedure: PERIPHERAL VASCULAR INTERVENTION;  Surgeon:  Iran OuchArida, Muhammad A, MD;  Location: [facility];  Service: Cardiovascular;  Laterality: Left;  Subclavian   PERIPHERAL VASCULAR INTERVENTION  10/03/2017   Procedure: PERIPHERAL VASCULAR INTERVENTION;  Surgeon: Iran OuchArida, Muhammad A, MD;  Location: [facility];  Service: Cardiovascular;;  LCIA   TUBAL LIGATION  1970   ULTRASOUND GUIDANCE FOR VASCULAR ACCESS  04/03/2017   Procedure: Ultrasound Guidance For Vascular Access;  Surgeon: Corky CraftsVaranasi, Jayadeep S, MD;  [facility];  Service: Cardiovascular;;   UPPER EXTREMITY ANGIOGRAPHY Left 07/25/2017   Procedure: UPPER EXTREMITY ANGIOGRAPHY;  Surgeon: Iran OuchArida, Muhammad A, MD;  Location: [facility];  Service: Cardiovascular;  Laterality: Left;     Home Medications:  Prior to Admission medications   Medication Sig Start Date End Date Taking? Authorizing Provider  ALPRAZolam Prudy Feeler(XANAX) 1 MG tablet Take 1 mg by mouth at bedtime.  12/09/13   [provider]  amLODipine (NORVASC) 5 MG tablet Take 1 tablet (5 mg total) by mouth daily. 06/03/15   Creig HinesBerge, Christopher Ronald, NP  cholecalciferol (VITAMIN D3) 25 MCG (1000 UNIT) tablet Take 1,000 Units by mouth daily.    [provider]  clopidogrel (PLAVIX) 75 MG tablet TAKE 1 TABLET EVERY DAY 06/06/21   Iran OuchArida, Muhammad A, MD  ezetimibe (ZETIA) 10 MG tablet TAKE 1 TABLET EVERY DAY 06/06/21   Iran OuchArida, Muhammad A, MD  levothyroxine (SYNTHROID, LEVOTHROID) 50 MCG tablet Take 50 mcg by mouth daily before breakfast.     [provider]  linaclotide (LINZESS) 290 MCG CAPS capsule Take 290 mcg by mouth daily as needed (constipation).    [provider]  metoprolol succinate (TOPROL-XL) 50 MG 24 hr tablet Take 25-50 mg by mouth See admin instructions. Take 25 mg in the morning and 50 mg at night    [provider]  nitroGLYCERIN (NITROSTAT) 0.4 MG SL tablet PLACE ONE TABLET UNDER THE TONGUE EVERY 5 MINUTES FOR THREE DOSES AS NEEDED FOR CHEST PAIN 10/07/20   Iran OuchArida,  Muhammad A, MD  ondansetron (ZOFRAN) 4 MG tablet Take 4 mg by mouth every 8 (eight) hours as needed. 03/11/20   [provider]  pantoprazole (PROTONIX) 40 MG tablet Take 40 mg by mouth at bedtime.    [provider]  quinapril (ACCUPRIL) 20 MG tablet Take 20 mg by mouth 2 (two) times daily.     [provider]  rosuvastatin (CRESTOR) 40 MG tablet Take 1 tablet (40 mg total) by mouth daily at 6 PM. 04/05/17   Allayne ButcherSimmons, Brittainy M, Dawn Boyer  sertraline (ZOLOFT) 100 MG tablet Take 100 mg by mouth daily.  01/19/17   [provider]  Sodium Phosphates (ENEMA RE) Place 1 each rectally 2 (two) times daily. May use an additional 2 times as needed for constipation    [provider]  zinc gluconate 50 MG tablet Take 50 mg by mouth daily.  [provider]    Inpatient Medications: Scheduled Meds:  nitroGLYCERIN  1 inch Topical Once   Continuous Infusions:  PRN Meds:   Allergies:   No Known Allergies  Social History:   Social History   Socioeconomic History   Marital status: Married    Spouse name: Not on file   Number of children: Not on file   Years of education: Not on file   Highest education level: Not on file  Occupational History   Not on file  Tobacco Use   Smoking status: Former    Packs/day: 1.00    Years: 12.00    Total pack years: 12.00    Types: Cigarettes    Quit date: 11/21/1982    Years since quitting: 38.5   Smokeless tobacco: Never  Vaping Use   Vaping Use: Never used  Substance and Sexual Activity   Alcohol use: No    Alcohol/week: 0.0 standard drinks of alcohol   Drug use: No   Sexual activity: Not on file  Other Topics Concern   Not on file  Social History Narrative   Not on file   Social Determinants of Health   Financial Resource Strain: Not on file  Food Insecurity: Not on file  Transportation Needs: Not on file  Physical Activity: Not on file  Stress: Not on file  Social Connections: Not on file   Intimate Partner Violence: Not on file    Family History:   Family History  Problem Relation Age of Onset   Hypertension Sister    Hyperlipidemia Sister    Heart disease Brother    Stroke Sister    Heart Problems Son        Pt states son had a "skipped heart beats"   Diabetes Daughter    Colon cancer Neg Hx    Esophageal cancer Neg Hx    Rectal cancer Neg Hx    Stomach cancer Neg Hx      ROS:  Please see the history of present illness.  All other ROS reviewed and negative.     Physical Exam/Data:   Vitals:   06/11/21 1115 06/11/21 1130 06/11/21 1145 06/11/21 1200  BP: (!) 142/64 (!) 151/75 139/63 (!) 171/95  Pulse: 69 72 71 80  Resp: 19 18 19  (!) 23  Temp:      TempSrc:      SpO2: 98% 97% 96% 99%   No intake or output data in the 24 hours ending 06/11/21 1233    05/06/2020    2:43 PM 07/04/2019    8:40 AM 03/05/2018    3:54 PM  Last 3 Weights  Weight (lbs) 120 lb 131 lb 127 lb 8 oz  Weight (kg) 54.432 kg 59.421 kg 57.834 kg     There is no height or weight on file to calculate BMI.  General:  Well nourished, well developed, in no acute distress HEENT: normal Neck: no JVD Vascular: R>L carotid bruits; Distal pulses 2+ bilaterally Cardiac:  normal S1, S2; RRR; 2-3/6 murmur  Lungs:  R>L basilar rales, no wheezing, rhonchi    Abd: soft, nontender, no hepatomegaly  Ext: no edema Musculoskeletal:  No deformities, BUE and BLE strength normal and equal Skin: warm and dry  Neuro:  CNs 2-12 intact, no focal abnormalities noted Psych:  Normal affect   EKG:  The EKG was personally reviewed and demonstrates: Sinus rhythm, heart rate 61, anterolateral T wave changes are deeper than on 05/2020 ECG Telemetry:  Telemetry was personally  reviewed and demonstrates: Sinus rhythm  Relevant CV Studies:  CARDIAC CATH: 04/03/2017 Ost 1st Diag lesion is 100% stenosed. SVG to diagonal has a Mid Graft lesion is 95% stenosed. Ost Cx to Prox Cx lesion is 100% stenosed. SVG to Left PDA is  patent. Ost LM to Mid LM lesion is 100% stenosed. LIMA to LAD. 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 There was difficulty navigating from the right groin to the aorta due to calcific lesion at the aortoiliac bifurcation. There was a 30 mm Hg pressure gradient from the right groin to the aorta. SVG to diagonal has a Mid Graft lesion is 95% stenosed. A drug-eluting stent was successfully placed using a STENT SYNERGY DES 2.25X12. Post intervention, there is a 0% residual stenosis. The left ventricular systolic function is normal. LV end diastolic pressure is normal. The left ventricular ejection fraction is 55-65% by visual estimate.   Continue dual antiplatelet therapy for at least 6 months.  Dawn Boyer discuss with PV MD regarding proximal subclavian stenosis, as it does compromise the LIMA.   Continue Angiomax until current bag runs out.   Dawn Boyer have to see if angina is improved after PCI of the SVG to diagonal.  If not, would then have to consider intervention to the proximal subclavian to improve flow to the LIMA graft. Intervention   CAROTID US: 07/30/2020 Summary:  Right Carotid: Velocities in the right ICA are consistent with a 1-39%  stenosis.                 Non-hemodynamically significant plaque <50% noted in the  CCA. The ECA appears <50% stenosed.   Left Carotid: Velocities in the left ICA are consistent with a 40-59%  stenosis.                Non-hemodynamically significant plaque <50% noted in the  CCA. The ECA appears <50% stenosed.   Vertebrals:  Bilateral vertebral arteries demonstrate antegrade flow. Left               vertebral artery demonstrates bidirectional flow.  Subclavians: Normal flow hemodynamics were seen in bilateral subclavian arteries.    AORTIC/ILIAC [place]: 07/10/2020 Summary:  Abdominal Aorta: No evidence of an abdominal aortic aneurysm was   visualized. The largest aortic measurement is 1.9 cm. The largest aortic  diameter remains essentially unchanged compared to prior exam. Previous diameter measurement was 1.8 cm obtained on 07/03/19.  Stenosis: +------------------+-------------+--------+  Location          Stenosis     Comments  +------------------+-------------+--------+  Right Common Iliac>50% stenosisdistal    +------------------+-------------+--------+  Left Common Iliac >50% stenosismid       +------------------+-------------+--------+    Laboratory Data:  High Sensitivity Troponin:   Recent Labs  Lab 06/11/21 0212 06/11/21 0730  TROPONINIHS 45* 36*     Chemistry Recent Labs  Lab 06/11/21 0006  NA 139  K 3.8  CL 106  CO2 26  GLUCOSE 123*  BUN 17  CREATININE 0.78  CALCIUM 9.2  GFRNONAA >60  ANIONGAP 7    No results for input(s): "PROT", "ALBUMIN", "AST", "ALT", "ALKPHOS", "BILITOT" in the last 168 hours. Lipids No results for input(s): "CHOL", "TRIG", "HDL", "LABVLDL", "LDLCALC", "CHOLHDL" in the last 168 hours.  Hematology Recent Labs  Lab 06/11/21 0006  WBC 5.1  RBC 3.41*  HGB 10.7*  HCT 32.5*  MCV 95.3  MCH 31.4  MCHC 32.9  RDW 12.7  PLT 142*   Thyroid No results for input(s): "TSH", "FREET4" in the last 168 hours.  BNP Recent Labs  Lab 06/11/21 0007  BNP 512.2*    DDimer No results for input(s): "DDIMER" in the last 168 hours.   Radiology/Studies:  DG Chest 2 View  Result Date: 06/11/2021 CLINICAL DATA:  Chest pain and shortness of breath. EXAM: CHEST - 2 VIEW COMPARISON:  Chest radiograph dated 03/12/2020. FINDINGS: Diffuse chronic interstitial coarsening. No focal consolidation, pleural effusion, pneumothorax. The cardiac silhouette is within limits. Atherosclerotic calcification of the aorta. Median sternotomy wires and CABG vascular clips. No acute osseous pathology. IMPRESSION: No active cardiopulmonary disease. Electronically Signed   By: Elgie Collard M.D.    On: 06/11/2021 01:04     Assessment and Plan:   Progressive angina with elevated troponin -She has been having problems with throat tightness and shortness of breath reminiscent of her pre-PCI symptoms in 2019, for the last several months. -Last night, the symptoms were worse than usual and did not resolve is normally they did, so she came to the ER - Her troponin is mildly elevated and trending down - Her ECG has deeper T wave inversions than on her ECG from a year ago - Admit, and Nitropaste and heparin, cath Monday  2.  Hypertension: - The patient says she stopped her amlodipine because she heard something about it, it sounds like a recall -Dawn Dawn Boyer restart the amlodipine 5 and continue her other home medications for her blood pressure including quinapril 20 mg twice daily, Toprol-XL 25 mg in the morning and 50 mg at night (Dawn Boyer increase the Toprol-XL to 50 mg twice daily)  3.  Hypothyroid: - Check TSH and continue home dose of Synthroid  4.  Hyperlipidemia -Continue Crestor 40 mg daily and Zetia 10 mg daily  Continue other home medications at current doses   Risk Assessment/Risk Scores:     TIMI Risk Score for Unstable Angina or Non-ST Elevation MI:   The patient's TIMI risk score is 6, which indicates a 41% risk of all cause mortality, new or recurrent myocardial infarction or need for urgent revascularization in the next 14 days.      For questions or updates, please contact [facility] Please consult [email or web address] for contact info under    Signed, Dawn Demark, Dawn Boyer  06/11/2021 12:33 PM  I have seen and examined this patient with Dawn Boyer.  Agree with above, note added to reflect my findings.  Patient presented to the ER with throat tightness and shortness of breath. She has a history of CAD with CABG and graft stenting. Pain improved with NTG and xanax. Troponin mildly elevated but flat.  Feeling well in the ER.  GEN: Well nourished, well developed, in no  acute distress  HEENT: normal  Neck: no JVD, carotid bruits, or masses Cardiac: RRR; no murmurs, rubs, or gallops,no edema  Respiratory:  clear to auscultation bilaterally, normal work of breathing GI: soft, nontender, nondistended, + BS MS: no deformity or atrophy  Skin: warm and dry Neuro:  Strength and sensation are intact Psych: euthymic mood, full affect   Unstable angina: worsening chest pain with exertion. Has history of CAD post CABG. Due to [place], [place] plan for [facility] Monday.   Dawn Malerba M. Jearlean Demauro MD 06/11/2021 12:48 PM

## 2021-06-12 ENCOUNTER — Inpatient Hospital Stay (HOSPITAL_COMMUNITY): Payer: Medicare HMO

## 2021-06-12 DIAGNOSIS — R079 Chest pain, unspecified: Secondary | ICD-10-CM

## 2021-06-12 DIAGNOSIS — I249 Acute ischemic heart disease, unspecified: Secondary | ICD-10-CM

## 2021-06-12 LAB — ECHOCARDIOGRAM COMPLETE
AR max vel: 0.76 cm2
AV Area VTI: 0.73 cm2
AV Area mean vel: 0.8 cm2
AV Mean grad: 29 mmHg
AV Peak grad: 47.6 mmHg
Ao pk vel: 3.45 m/s
Area-P 1/2: 3.54 cm2
S' Lateral: 3.45 cm
Weight: 1875.2 oz

## 2021-06-12 LAB — LIPID PANEL
Cholesterol: 150 mg/dL (ref 0–200)
HDL: 54 mg/dL (ref >40)
LDL Cholesterol: 79 mg/dL (ref 0–99)
Total CHOL/HDL Ratio: 2.8 RATIO
Triglycerides: 86 mg/dL (ref <150)
VLDL: 17 mg/dL (ref 0–40)

## 2021-06-12 LAB — CBC
HCT: 33.3 % — ABNORMAL LOW (ref 36.0–46.0)
Hemoglobin: 11.5 g/dL — ABNORMAL LOW (ref 12.0–15.0)
MCH: 32.1 pg (ref 26.0–34.0)
MCHC: 34.5 g/dL (ref 30.0–36.0)
MCV: 93 fL (ref 80.0–100.0)
Platelets: 141 10*3/uL — ABNORMAL LOW (ref 150–400)
RBC: 3.58 MIL/uL — ABNORMAL LOW (ref 3.87–5.11)
RDW: 12.8 % (ref 11.5–15.5)
WBC: 5.8 10*3/uL (ref 4.0–10.5)
nRBC: 0 % (ref 0.0–0.2)

## 2021-06-12 LAB — HEPARIN LEVEL (UNFRACTIONATED)
Heparin Unfractionated: 0.31 IU/mL (ref 0.30–0.70)
Heparin Unfractionated: 0.39 IU/mL (ref 0.30–0.70)

## 2021-06-12 MED ORDER — POTASSIUM CHLORIDE CRYS ER 20 MEQ PO TBCR
40.0000 meq | EXTENDED_RELEASE_TABLET | Freq: Once | ORAL | Status: AC
  Administered 2021-06-12: 40 meq via ORAL
  Filled 2021-06-12: qty 2

## 2021-06-12 MED ORDER — ASPIRIN 81 MG PO TBEC
81.0000 mg | DELAYED_RELEASE_TABLET | Freq: Every day | ORAL | Status: DC
  Administered 2021-06-12 – 2021-06-14 (×2): 81 mg via ORAL
  Filled 2021-06-12 (×2): qty 1

## 2021-06-12 MED ORDER — SODIUM CHLORIDE 0.9 % WEIGHT BASED INFUSION
1.0000 mL/kg/h | INTRAVENOUS | Status: DC
  Administered 2021-06-13: 1 mL/kg/h via INTRAVENOUS

## 2021-06-12 MED ORDER — SODIUM CHLORIDE 0.9% FLUSH: 3.0000 mL | INTRAVENOUS | Status: DC | PRN

## 2021-06-12 MED ORDER — SODIUM CHLORIDE 0.9% FLUSH
3.0000 mL | Freq: Two times a day (BID) | INTRAVENOUS | Status: DC
  Administered 2021-06-13: 3 mL via INTRAVENOUS

## 2021-06-12 MED ORDER — ASPIRIN 81 MG PO CHEW
81.0000 mg | CHEWABLE_TABLET | ORAL | Status: AC
  Administered 2021-06-13: 81 mg via ORAL
  Filled 2021-06-12: qty 1

## 2021-06-12 MED ORDER — SODIUM CHLORIDE 0.9 % WEIGHT BASED INFUSION
3.0000 mL/kg/h | INTRAVENOUS | Status: DC
  Administered 2021-06-13: 3 mL/kg/h via INTRAVENOUS

## 2021-06-12 MED ORDER — SODIUM CHLORIDE 0.9 % IV SOLN: 250.0000 mL | INTRAVENOUS | Status: DC | PRN

## 2021-06-12 MED ORDER — CLOPIDOGREL BISULFATE 75 MG PO TABS
75.0000 mg | ORAL_TABLET | ORAL | Status: AC
  Administered 2021-06-13: 75 mg via ORAL

## 2021-06-12 NOTE — Progress Notes (Signed)
ANTICOAGULATION CONSULT NOTE - Follow Up Consult  Pharmacy Consult for heparin Indication: chest pain/ACS  Labs: Recent Labs    06/11/21 0006 06/11/21 0212 06/11/21 0730 06/11/21 2323  HGB 10.7*  --   --  11.5*  HCT 32.5*  --   --  33.3*  PLT 142*  --   --  141*  HEPARINUNFRC  --   --   --  0.37  CREATININE 0.78  --   --  0.74  TROPONINIHS  --  45* 36*  --     Assessment/Plan:  76yo female therapeutic on heparin with initial dosing for CP. Will continue infusion at current rate of 700 units/hr and confirm stable with am labs.   Wynona Neat, PharmD, BCPS  06/12/2021,1:24 AM

## 2021-06-12 NOTE — Progress Notes (Signed)
Progress Note  Patient Name: Dawn Boyer Date of Encounter: 06/12/2021  [facility] Cardiologist: Larae Grooms, MD   Subjective   Currently feeling well without chest pain or shortness of breath  Inpatient Medications    Scheduled Meds:  ALPRAZolam  1 mg Oral QHS   amLODipine  5 mg Oral Daily   vitamin C  500 mg Oral Daily   clopidogrel  75 mg Oral Daily   ezetimibe  10 mg Oral Daily   levothyroxine  50 mcg Oral QAC breakfast   metoprolol succinate  50 mg Oral BID   nitroGLYCERIN  0.5 inch Topical Q6H   pantoprazole  40 mg Oral QHS   quinapril  20 mg Oral BID   rosuvastatin  40 mg Oral q1800   sertraline  100 mg Oral Daily   sodium chloride flush  3 mL Intravenous Q12H   zinc sulfate  220 mg Oral Daily   Continuous Infusions:  sodium chloride     heparin 700 Units/hr (06/12/21 0000)   PRN Meds: sodium chloride, acetaminophen, ALPRAZolam, linaclotide, nitroGLYCERIN, ondansetron (ZOFRAN) IV, sodium chloride flush, zolpidem   Vital Signs    Vitals:   06/11/21 1830 06/11/21 2010 06/12/21 0045 06/12/21 0516  BP: 139/85 137/73 106/61 127/69  Pulse: 75 75 79 73  Resp: (!) 23     Temp:  98.5 F (36.9 C) 98.3 F (36.8 C) 97.8 F (36.6 C)  TempSrc:  Oral Oral Oral  SpO2: 97%     Weight:    53.2 kg    Intake/Output Summary (Last 24 hours) at 06/12/2021 0744 Last data filed at 06/12/2021 0000 Gross per 24 hour  Intake 78.96 ml  Output --  Net 78.96 ml      06/12/2021    5:16 AM 05/06/2020    2:43 PM 07/04/2019    8:40 AM  Last 3 Weights  Weight (lbs) 117 lb 3.2 oz 120 lb 131 lb  Weight (kg) 53.162 kg 54.432 kg 59.421 kg      Telemetry    Sinus rhythm- Personally Reviewed  ECG    None new- Personally Reviewed  Physical Exam   GEN: No acute distress.   Neck: No JVD Cardiac: RRR, no murmurs, rubs, or gallops.  Respiratory: Clear to auscultation bilaterally. GI: Soft, nontender, non-distended  MS: No edema; No deformity. Neuro:  Nonfocal   Psych: Normal affect   Labs    High Sensitivity Troponin:   Recent Labs  Lab 06/11/21 0212 06/11/21 0730  TROPONINIHS 45* 36*     Chemistry Recent Labs  Lab 06/11/21 0006 06/11/21 1730 06/11/21 2323  NA 139  --  140  K 3.8  --  3.6  CL 106  --  107  CO2 26  --  24  GLUCOSE 123*  --  120*  BUN 17  --  12  CREATININE 0.78  --  0.74  CALCIUM 9.2  --  9.1  PROT  --  6.8  --   ALBUMIN  --  3.6  --   AST  --  29  --   ALT  --  16  --   ALKPHOS  --  71  --   BILITOT  --  0.8  --   GFRNONAA >60  --  >60  ANIONGAP 7  --  9    Lipids  Recent Labs  Lab 06/11/21 2323  CHOL 150  TRIG 86  HDL 54  LDLCALC 79  CHOLHDL 2.8  Hematology Recent Labs  Lab 06/11/21 0006 06/11/21 2323  WBC 5.1 5.8  RBC 3.41* 3.58*  HGB 10.7* 11.5*  HCT 32.5* 33.3*  MCV 95.3 93.0  MCH 31.4 32.1  MCHC 32.9 34.5  RDW 12.7 12.8  PLT 142* 141*   Thyroid  Recent Labs  Lab 06/11/21 1730  TSH 1.213    BNP Recent Labs  Lab 06/11/21 0007  BNP 512.2*    DDimer No results for input(s): "DDIMER" in the last 168 hours.   Radiology    DG Chest 2 View  Result Date: 06/11/2021 CLINICAL DATA:  Chest pain and shortness of breath. EXAM: CHEST - 2 VIEW COMPARISON:  Chest radiograph dated 03/12/2020. FINDINGS: Diffuse chronic interstitial coarsening. No focal consolidation, pleural effusion, pneumothorax. The cardiac silhouette is within limits. Atherosclerotic calcification of the aorta. Median sternotomy wires and CABG vascular clips. No acute osseous pathology. IMPRESSION: No active cardiopulmonary disease. Electronically Signed   By: Anner Crete M.D.   On: 06/11/2021 01:04    Cardiac Studies   TTE pending  Patient Profile     76 y.o. female with a history of coronary artery disease status post CABG and stenting who presented to the hospital with shortness of breath and neck discomfort concerning for unstable angina.  Assessment & Plan    1.  Unstable angina: Patient has a  history of coronary artery disease with CABG and stenting.  Currently on IV heparin.  We Dayan Kreis plan for left heart catheterization tomorrow.  Risk and benefits of been discussed.  The patient understands these risks and is agreed to the procedure.  The patient understands that risks include but are not limited to stroke (1 in 1000), death (1 in 28), kidney failure [usually temporary] (1 in 500), bleeding (1 in 200), allergic reaction [possibly serious] (1 in 200), and agrees to proceed.  2.  Hypertension: Continue home antihypertensives.  Blood pressure overall well controlled  3.  Hypothyroidism: Continue home dose of Synthroid  4.  Hyperlipidemia: Continue Crestor and Zetia   For questions or updates, please contact [place] Please consult [email or web address] for contact info under        Signed, Hymen Arnett Meredith Leeds, MD  06/12/2021, 7:44 AM

## 2021-06-12 NOTE — Progress Notes (Signed)
ANTICOAGULATION CONSULT NOTE - Initial Consult  Pharmacy Consult for heparin Indication: chest pain/ACS  No Known Allergies  Patient Measurements: Weight: 53.2 kg (117 lb 3.2 oz) Heparin Dosing Weight: TBW  Vital Signs: Temp: 97.8 F (36.6 C) (06/11 0516) Temp Source: Oral (06/11 0516) BP: 127/69 (06/11 0516) Pulse Rate: 73 (06/11 0516)  Labs: Recent Labs    06/11/21 0006 06/11/21 0212 06/11/21 0730 06/11/21 2323 06/12/21 0336  HGB 10.7*  --   --  11.5*  --   HCT 32.5*  --   --  33.3*  --   PLT 142*  --   --  141*  --   HEPARINUNFRC  --   --   --  0.37 0.31  CREATININE 0.78  --   --  0.74  --   TROPONINIHS  --  45* 36*  --   --      CrCl cannot be calculated (Unknown ideal weight.).   Medical History: Past Medical History:  Diagnosis Date   Anemia    hx   Anxiety    Arthritis    Carotid arterial disease ([place])    a. 04/2015 Carotid U/S: RICA 123456, LICA 123456, no change.   Coronary artery disease    a. 1994 s/p CABG;  b. 03/2012 MV: No ischemia/infarct. DES SVG-Diag 2019   Depression    Diverticulosis    Elevated LFTs    Fibrocystic breast    GERD (gastroesophageal reflux disease)    Hemorrhoid    Hemorrhoids    History of hemolysis, elevated liver enzymes, and low platelet (HELLP) syndrome, currently pregnant    patient denies history   Hyperlipidemia    Hypertension    Hypertensive heart disease    Hypothyroidism    Insomnia    Nephrolithiasis    PONV (postoperative nausea and vomiting)    after heart surgery    Assessment: 49 YOF with CP and SOB, hx CAD s/p CABG/PCI.  She is not on anticoagulation PTA, plan for LHC on Monday.  Heparin level this morning is just therapeutic at 0.31 on 700 units/hr. Will increase gtt slightly to maintain in therapeutic range. No CBC this AM as one done last night, which was stable. Per RN slight bleeding from ear, from a scratch, but not of concern.   Goal of Therapy:  Heparin level 0.3-0.7 units/ml Monitor  platelets by anticoagulation protocol: Yes   Plan:  Increase heparin drip to 750 units/hr. Heparin level in 8 hours given rate change Daily heparin level and CBC while on therapy  Cathrine Muster, PharmD, BCPS PGY2 Cardiology Pharmacy Resident 06/12/2021  7:24 AM  Please check [email or web address] for unit-specific pharmacy phone numbers.

## 2021-06-12 NOTE — Progress Notes (Signed)
ANTICOAGULATION CONSULT NOTE  Pharmacy Consult for heparin Indication: chest pain/ACS  No Known Allergies  Patient Measurements: Weight: 53.2 kg (117 lb 3.2 oz) Heparin Dosing Weight: TBW  Vital Signs: Temp: 98.3 F (36.8 C) (06/11 1142) Temp Source: Oral (06/11 1142) BP: 137/61 (06/11 1142) Pulse Rate: 76 (06/11 1142)  Labs: Recent Labs    06/11/21 0006 06/11/21 0212 06/11/21 0730 06/11/21 2323 06/12/21 0336 06/12/21 1600  HGB 10.7*  --   --  11.5*  --   --   HCT 32.5*  --   --  33.3*  --   --   PLT 142*  --   --  141*  --   --   HEPARINUNFRC  --   --   --  0.37 0.31 0.39  CREATININE 0.78  --   --  0.74  --   --   TROPONINIHS  --  45* 36*  --   --   --      CrCl cannot be calculated (Unknown ideal weight.).  Assessment: 34 YOF with history of CAD s/p CABG/PCI presented with chest pain.  Pharmacy consulted to dose IV heparin.  Heparin level therapeutic at 0.39 units/mL; no overt bleeding reported.  Goal of Therapy:  Heparin level 0.3-0.7 units/ml Monitor platelets by anticoagulation protocol: Yes   Plan:  Continue heparin infusion at 750 units/hr Daily heparin level and CBC while on therapy Cath in AM  Isaia Hassell D. Laney Potash, PharmD, BCPS, BCCCP 06/12/2021, 5:32 PM

## 2021-06-12 NOTE — Progress Notes (Signed)
  Echocardiogram 2D Echocardiogram has been performed.  Dawn Boyer F 06/12/2021, 11:28 AM

## 2021-06-12 NOTE — Progress Notes (Signed)
Patient is alert and oriented x4 and daughter is at the bedside. RN went over the informed consent with the patient and family, patient verbalize understanding. Patient has signed the informed consent and RN has placed consent in the chart.   Patient states "that my previous caths caused her to be coded" and would like to speak to cardiology about the risks vs benefits prior to the procedure. Patient verbalize to RN that she understands why she needs to have another cath and signed the consent.

## 2021-06-13 ENCOUNTER — Encounter (HOSPITAL_COMMUNITY): Payer: Self-pay | Admitting: Interventional Cardiology

## 2021-06-13 ENCOUNTER — Encounter (HOSPITAL_COMMUNITY)
Admission: EM | Disposition: A | Discharge status: Home / Self Care | Payer: Self-pay | Source: Home / Self Care | Attending: Cardiology

## 2021-06-13 DIAGNOSIS — I350 Nonrheumatic aortic (valve) stenosis: Secondary | ICD-10-CM

## 2021-06-13 DIAGNOSIS — I2510 Atherosclerotic heart disease of native coronary artery without angina pectoris: Secondary | ICD-10-CM

## 2021-06-13 DIAGNOSIS — R0602 Shortness of breath: Principal | ICD-10-CM

## 2021-06-13 DIAGNOSIS — I249 Acute ischemic heart disease, unspecified: Secondary | ICD-10-CM | POA: Diagnosis not present

## 2021-06-13 HISTORY — PX: RIGHT/LEFT HEART CATH AND CORONARY/GRAFT ANGIOGRAPHY: CATH118267

## 2021-06-13 LAB — POCT I-STAT EG7
Acid-Base Excess: 0 mmol/L (ref 0.0–2.0)
Acid-Base Excess: 1 mmol/L (ref 0.0–2.0)
Acid-base deficit: 1 mmol/L (ref 0.0–2.0)
Bicarbonate: 24.9 mmol/L (ref 20.0–28.0)
Bicarbonate: 26.5 mmol/L (ref 20.0–28.0)
Bicarbonate: 26.8 mmol/L (ref 20.0–28.0)
Calcium, Ion: 1.28 mmol/L (ref 1.15–1.40)
Calcium, Ion: 1.29 mmol/L (ref 1.15–1.40)
Calcium, Ion: 1.3 mmol/L (ref 1.15–1.40)
HCT: 31 % — ABNORMAL LOW (ref 36.0–46.0)
HCT: 31 % — ABNORMAL LOW (ref 36.0–46.0)
HCT: 32 % — ABNORMAL LOW (ref 36.0–46.0)
Hemoglobin: 10.5 g/dL — ABNORMAL LOW (ref 12.0–15.0)
Hemoglobin: 10.5 g/dL — ABNORMAL LOW (ref 12.0–15.0)
Hemoglobin: 10.9 g/dL — ABNORMAL LOW (ref 12.0–15.0)
O2 Saturation: 63 %
O2 Saturation: 65 %
O2 Saturation: 95 %
Potassium: 3.9 mmol/L (ref 3.5–5.1)
Potassium: 3.9 mmol/L (ref 3.5–5.1)
Potassium: 3.9 mmol/L (ref 3.5–5.1)
Sodium: 142 mmol/L (ref 135–145)
Sodium: 142 mmol/L (ref 135–145)
Sodium: 142 mmol/L (ref 135–145)
TCO2: 26 mmol/L (ref 22–32)
TCO2: 28 mmol/L (ref 22–32)
TCO2: 28 mmol/L (ref 22–32)
pCO2, Ven: 43.8 mmHg — ABNORMAL LOW (ref 44–60)
pCO2, Ven: 49 mmHg (ref 44–60)
pCO2, Ven: 49.6 mmHg (ref 44–60)
pH, Ven: 7.34 (ref 7.25–7.43)
pH, Ven: 7.34 (ref 7.25–7.43)
pH, Ven: 7.363 (ref 7.25–7.43)
pO2, Ven: 35 mmHg (ref 32–45)
pO2, Ven: 36 mmHg (ref 32–45)
pO2, Ven: 76 mmHg — ABNORMAL HIGH (ref 32–45)

## 2021-06-13 LAB — CBC
HCT: 31.5 % — ABNORMAL LOW (ref 36.0–46.0)
Hemoglobin: 10.7 g/dL — ABNORMAL LOW (ref 12.0–15.0)
MCH: 32 pg (ref 26.0–34.0)
MCHC: 34 g/dL (ref 30.0–36.0)
MCV: 94.3 fL (ref 80.0–100.0)
Platelets: 126 10*3/uL — ABNORMAL LOW (ref 150–400)
RBC: 3.34 MIL/uL — ABNORMAL LOW (ref 3.87–5.11)
RDW: 12.8 % (ref 11.5–15.5)
WBC: 4.6 10*3/uL (ref 4.0–10.5)
nRBC: 0 % (ref 0.0–0.2)

## 2021-06-13 LAB — HEPARIN LEVEL (UNFRACTIONATED): Heparin Unfractionated: 0.41 IU/mL (ref 0.30–0.70)

## 2021-06-13 SURGERY — RIGHT/LEFT HEART CATH AND CORONARY/GRAFT ANGIOGRAPHY
Anesthesia: LOCAL

## 2021-06-13 MED ORDER — HEPARIN (PORCINE) IN NACL 1000-0.9 UT/500ML-% IV SOLN
INTRAVENOUS | Status: DC | PRN
  Administered 2021-06-13 (×2): 500 mL

## 2021-06-13 MED ORDER — IOHEXOL 350 MG/ML SOLN
INTRAVENOUS | Status: DC | PRN
  Administered 2021-06-13: 55 mL

## 2021-06-13 MED ORDER — NITROGLYCERIN 1 MG/10 ML FOR IR/CATH LAB
INTRA_ARTERIAL | Status: AC
  Filled 2021-06-13: qty 10

## 2021-06-13 MED ORDER — HEPARIN SODIUM (PORCINE) 1000 UNIT/ML IJ SOLN
INTRAMUSCULAR | Status: AC
  Filled 2021-06-13: qty 10

## 2021-06-13 MED ORDER — LIDOCAINE HCL (PF) 1 % IJ SOLN
INTRAMUSCULAR | Status: AC
  Filled 2021-06-13: qty 30

## 2021-06-13 MED ORDER — HYDRALAZINE HCL 20 MG/ML IJ SOLN: 10.0000 mg | INTRAMUSCULAR | Status: AC | PRN

## 2021-06-13 MED ORDER — ISOSORBIDE MONONITRATE ER 30 MG PO TB24
15.0000 mg | ORAL_TABLET | Freq: Every day | ORAL | Status: DC
  Administered 2021-06-13 – 2021-06-14 (×2): 15 mg via ORAL
  Filled 2021-06-13 (×2): qty 1

## 2021-06-13 MED ORDER — SODIUM CHLORIDE 0.9% FLUSH: 3.0000 mL | INTRAVENOUS | Status: DC | PRN

## 2021-06-13 MED ORDER — HEPARIN SODIUM (PORCINE) 1000 UNIT/ML IJ SOLN
INTRAMUSCULAR | Status: DC | PRN
  Administered 2021-06-13: 1000 [IU] via INTRAVENOUS
  Administered 2021-06-13: 3000 [IU] via INTRAVENOUS

## 2021-06-13 MED ORDER — MIDAZOLAM HCL 2 MG/2ML IJ SOLN
INTRAMUSCULAR | Status: AC
  Filled 2021-06-13: qty 2

## 2021-06-13 MED ORDER — VERAPAMIL HCL 2.5 MG/ML IV SOLN
INTRAVENOUS | Status: DC | PRN
  Administered 2021-06-13 (×2): 10 mL via INTRA_ARTERIAL

## 2021-06-13 MED ORDER — NITROGLYCERIN 1 MG/10 ML FOR IR/CATH LAB
INTRA_ARTERIAL | Status: DC | PRN
  Administered 2021-06-13: 100 ug

## 2021-06-13 MED ORDER — ALUM & MAG HYDROXIDE-SIMETH 200-200-20 MG/5ML PO SUSP: 30.0000 mL | Freq: Once | ORAL | Status: DC

## 2021-06-13 MED ORDER — ONDANSETRON HCL 4 MG/2ML IJ SOLN: 4.0000 mg | Freq: Four times a day (QID) | INTRAMUSCULAR | Status: DC | PRN

## 2021-06-13 MED ORDER — MIDAZOLAM HCL 2 MG/2ML IJ SOLN
INTRAMUSCULAR | Status: DC | PRN
  Administered 2021-06-13: 1 mg via INTRAVENOUS
  Administered 2021-06-13: 2 mg via INTRAVENOUS

## 2021-06-13 MED ORDER — FENTANYL CITRATE (PF) 100 MCG/2ML IJ SOLN
INTRAMUSCULAR | Status: DC | PRN
  Administered 2021-06-13 (×2): 25 ug via INTRAVENOUS

## 2021-06-13 MED ORDER — FENTANYL CITRATE (PF) 100 MCG/2ML IJ SOLN
INTRAMUSCULAR | Status: AC
  Filled 2021-06-13: qty 2

## 2021-06-13 MED ORDER — ACETAMINOPHEN 325 MG PO TABS: 650.0000 mg | ORAL_TABLET | ORAL | Status: DC | PRN

## 2021-06-13 MED ORDER — NITROGLYCERIN 0.4 MG SL SUBL
0.4000 mg | SUBLINGUAL_TABLET | SUBLINGUAL | Status: DC | PRN
Start: 2021-06-13 — End: 2021-06-14

## 2021-06-13 MED ORDER — LIDOCAINE HCL (PF) 1 % IJ SOLN
INTRAMUSCULAR | Status: DC | PRN
  Administered 2021-06-13 (×2): 2 mL

## 2021-06-13 MED ORDER — HEPARIN (PORCINE) IN NACL 1000-0.9 UT/500ML-% IV SOLN
INTRAVENOUS | Status: AC
  Filled 2021-06-13: qty 1000

## 2021-06-13 MED ORDER — VERAPAMIL HCL 2.5 MG/ML IV SOLN
INTRAVENOUS | Status: AC
  Filled 2021-06-13: qty 2

## 2021-06-13 MED ORDER — SODIUM CHLORIDE 0.9 % IV SOLN: INTRAVENOUS | Status: AC

## 2021-06-13 MED ORDER — SODIUM CHLORIDE 0.9 % IV SOLN: 250.0000 mL | INTRAVENOUS | Status: DC | PRN

## 2021-06-13 MED ORDER — SODIUM CHLORIDE 0.9% FLUSH
3.0000 mL | Freq: Two times a day (BID) | INTRAVENOUS | Status: DC
  Administered 2021-06-14: 3 mL via INTRAVENOUS

## 2021-06-13 MED ORDER — LABETALOL HCL 5 MG/ML IV SOLN: 10.0000 mg | INTRAVENOUS | Status: AC | PRN

## 2021-06-13 SURGICAL SUPPLY — 24 items
BAND ZEPHYR COMPRESS 30 LONG (HEMOSTASIS) ×1 IMPLANT
CATH 5FR JL4 DIAGNOSTIC (CATHETERS) ×1 IMPLANT
CATH BALLN WEDGE 5F 110CM (CATHETERS) ×1 IMPLANT
CATH EXPO 5F MPA-1 (CATHETERS) ×1 IMPLANT
CATH INFINITI 4FR JL 4.0 (CATHETERS) ×1 IMPLANT
CATH INFINITI 5 FR LCB (CATHETERS) ×1 IMPLANT
CATH INFINITI 5FR AL1 (CATHETERS) ×1 IMPLANT
CATH INFINITI JR4 5F (CATHETERS) ×1 IMPLANT
ELECT DEFIB PAD ADLT CADENCE (PAD) ×1 IMPLANT
GLIDESHEATH SLEND SS 6F .021 (SHEATH) ×1 IMPLANT
GUIDEWIRE .025 260CM (WIRE) ×1 IMPLANT
GUIDEWIRE INQWIRE 1.5J.035X260 (WIRE) IMPLANT
INQWIRE 1.5J .035X260CM (WIRE) ×2
KIT HEART LEFT (KITS) ×2 IMPLANT
PACK CARDIAC CATHETERIZATION (CUSTOM PROCEDURE TRAY) ×2 IMPLANT
SHEATH GLIDE SLENDER 4/5FR (SHEATH) ×1 IMPLANT
SHEATH PINNACLE 5F 10CM (SHEATH) ×1 IMPLANT
SHEATH PINNACLE 7F 10CM (SHEATH) ×1 IMPLANT
SHEATH PROBE COVER 6X72 (BAG) ×1 IMPLANT
SYR MEDRAD MARK 7 150ML (SYRINGE) ×2 IMPLANT
TRANSDUCER W/STOPCOCK (MISCELLANEOUS) ×2 IMPLANT
TUBING CIL FLEX 10 FLL-RA (TUBING) ×2 IMPLANT
WIRE EMERALD 3MM-J .035X150CM (WIRE) ×1 IMPLANT
WIRE HI TORQ VERSACORE-J 145CM (WIRE) ×1 IMPLANT

## 2021-06-13 NOTE — Progress Notes (Signed)
Patient has 0cc in left TR band. Tr band site is clean dry and intact. No signs of hematoma at the site.Notified Oncoming RN when it is appropriate to pull TR band. Tegaderm and gauze at bedside.

## 2021-06-13 NOTE — H&P (View-Only) (Signed)
Progress Note  Patient Name: Dawn Boyer Date of Encounter: 06/13/2021  [facility] Cardiologist: Larae Grooms, MD   Subjective   No throat pain (anginal equivalent) no SOB currently, somewhat anxious about cath - had significant bleeding from femoral site in past and notes she had a "code blue"  Inpatient Medications    Scheduled Meds:  ALPRAZolam  1 mg Oral QHS   amLODipine  5 mg Oral Daily   vitamin C  500 mg Oral Daily   aspirin EC  81 mg Oral Daily   clopidogrel  75 mg Oral Daily   ezetimibe  10 mg Oral Daily   levothyroxine  50 mcg Oral QAC breakfast   metoprolol succinate  50 mg Oral BID   nitroGLYCERIN  0.5 inch Topical Q6H   pantoprazole  40 mg Oral QHS   quinapril  20 mg Oral BID   rosuvastatin  40 mg Oral q1800   sertraline  100 mg Oral Daily   sodium chloride flush  3 mL Intravenous Q12H   sodium chloride flush  3 mL Intravenous Q12H   zinc sulfate  220 mg Oral Daily   Continuous Infusions:  sodium chloride     sodium chloride     sodium chloride 1 mL/kg/hr (06/13/21 0538)   heparin 750 Units/hr (06/12/21 2350)   PRN Meds: sodium chloride, sodium chloride, acetaminophen, ALPRAZolam, linaclotide, nitroGLYCERIN, ondansetron (ZOFRAN) IV, sodium chloride flush, sodium chloride flush, zolpidem   Vital Signs    Vitals:   06/12/21 2353 06/13/21 0428 06/13/21 0505 06/13/21 0738  BP: 137/70 (!) 142/75    Pulse: 66 60    Resp: 18 16  18   Temp: 97.6 F (36.4 C) 98.1 F (36.7 C)  98.2 F (36.8 C)  TempSrc: Oral Oral  Oral  SpO2: 96% 97%  95%  Weight:   52.8 kg     Intake/Output Summary (Last 24 hours) at 06/13/2021 0828 Last data filed at 06/13/2021 0400 Gross per 24 hour  Intake 805.06 ml  Output 500 ml  Net 305.06 ml      06/13/2021    5:05 AM 06/12/2021    5:16 AM 05/06/2020    2:43 PM  Last 3 Weights  Weight (lbs) 116 lb 8 oz 117 lb 3.2 oz 120 lb  Weight (kg) 52.844 kg 53.162 kg 54.432 kg      Telemetry    SR - Personally  Reviewed  ECG    No new - Personally Reviewed  Physical Exam   GEN: No acute distress.   Neck: No JVD Cardiac: RRR, no murmurs, rubs, or gallops.  Respiratory: Clear to auscultation bilaterally. GI: Soft, nontender, non-distended  MS: No edema; No deformity. Neuro:  Nonfocal  Psych: Normal affect   Labs    High Sensitivity Troponin:   Recent Labs  Lab 06/11/21 0212 06/11/21 0730  TROPONINIHS 45* 36*     Chemistry Recent Labs  Lab 06/11/21 0006 06/11/21 1730 06/11/21 2323  NA 139  --  140  K 3.8  --  3.6  CL 106  --  107  CO2 26  --  24  GLUCOSE 123*  --  120*  BUN 17  --  12  CREATININE 0.78  --  0.74  CALCIUM 9.2  --  9.1  PROT  --  6.8  --   ALBUMIN  --  3.6  --   AST  --  29  --   ALT  --  16  --  ALKPHOS  --  71  --   BILITOT  --  0.8  --   GFRNONAA >60  --  >60  ANIONGAP 7  --  9    Lipids  Recent Labs  Lab 06/11/21 2323  CHOL 150  TRIG 86  HDL 54  LDLCALC 79  CHOLHDL 2.8    Hematology Recent Labs  Lab 06/11/21 0006 06/11/21 2323 06/13/21 0258  WBC 5.1 5.8 4.6  RBC 3.41* 3.58* 3.34*  HGB 10.7* 11.5* 10.7*  HCT 32.5* 33.3* 31.5*  MCV 95.3 93.0 94.3  MCH 31.4 32.1 32.0  MCHC 32.9 34.5 34.0  RDW 12.7 12.8 12.8  PLT 142* 141* 126*   Thyroid  Recent Labs  Lab 06/11/21 1730  TSH 1.213    BNP Recent Labs  Lab 06/11/21 0007  BNP 512.2*    DDimer No results for input(s): "DDIMER" in the last 168 hours.   Radiology    ECHOCARDIOGRAM COMPLETE  Result Date: 06/12/2021    ECHOCARDIOGRAM REPORT   Patient Name:   Dawn Boyer Date of Exam: 06/12/2021 Medical Rec #:  [id number]    Height:       60.0 in Accession #:    [id number]   Weight:       117.2 lb Date of Birth:  [birth year]     BSA:          1.487 m Patient Age:    76 years     BP:           135/55 mmHg Patient Gender: F            HR:           72 bpm. Exam Location:  Inpatient Procedure: 2D Echo, Cardiac Doppler and Color Doppler Indications:    Chest pain  History:         Patient has no prior history of Echocardiogram examinations.                 CAD, Prior CABG; Signs/Symptoms:Shortness of Breath.  Sonographer:    Roosvelt Maser RDCS Referring Phys: [id number] RHONDA G BARRETT IMPRESSIONS  1. Calcified aortic valve with restricted motion; severe AS (mean gradient 29 mmHg; AVA 0.8 cm2; DI 0.23).  2. Left ventricular ejection fraction, by estimation, is 60 to 65%. The left ventricle has normal function. The left ventricle has no regional wall motion abnormalities. The left ventricular internal cavity size was mildly dilated. There is mild concentric left ventricular hypertrophy. Left ventricular diastolic parameters are consistent with Grade I diastolic dysfunction (impaired relaxation). Elevated left ventricular end-diastolic pressure.  3. Right ventricular systolic function is normal. The right ventricular size is normal.  4. Left atrial size was severely dilated.  5. The mitral valve is normal in structure. Mild mitral valve regurgitation. No evidence of mitral stenosis.  6. The aortic valve is tricuspid. Aortic valve regurgitation is not visualized. Severe aortic valve stenosis.  7. The inferior vena cava is normal in size with greater than 50% respiratory variability, suggesting right atrial pressure of 3 mmHg. Comparison(s): No prior Echocardiogram. FINDINGS  Left Ventricle: Left ventricular ejection fraction, by estimation, is 60 to 65%. The left ventricle has normal function. The left ventricle has no regional wall motion abnormalities. The left ventricular internal cavity size was mildly dilated. There is  mild concentric left ventricular hypertrophy. Left ventricular diastolic parameters are consistent with Grade I diastolic dysfunction (impaired relaxation). Elevated left ventricular end-diastolic pressure. Right Ventricle: The right ventricular size is  normal. Right ventricular systolic function is normal. Left Atrium: Left atrial size was severely dilated. Right Atrium: Right  atrial size was normal in size. Pericardium: There is no evidence of pericardial effusion. Mitral Valve: The mitral valve is normal in structure. Mild mitral annular calcification. Mild mitral valve regurgitation. No evidence of mitral valve stenosis. Tricuspid Valve: The tricuspid valve is normal in structure. Tricuspid valve regurgitation is mild . No evidence of tricuspid stenosis. Aortic Valve: The aortic valve is tricuspid. Aortic valve regurgitation is not visualized. Severe aortic stenosis is present. Aortic valve mean gradient measures 29.0 mmHg. Aortic valve peak gradient measures 47.6 mmHg. Aortic valve area, by VTI measures  0.73 cm. Pulmonic Valve: The pulmonic valve was normal in structure. Pulmonic valve regurgitation is mild. No evidence of pulmonic stenosis. Aorta: The aortic root is normal in size and structure. Venous: The inferior vena cava is normal in size with greater than 50% respiratory variability, suggesting right atrial pressure of 3 mmHg. IAS/Shunts: No atrial level shunt detected by color flow Doppler. Additional Comments: Calcified aortic valve with restricted motion; severe AS (mean gradient 29 mmHg; AVA 0.8 cm2; DI 0.23).  LEFT VENTRICLE PLAX 2D LVIDd:         5.10 cm   Diastology LVIDs:         3.45 cm   LV e' medial:    4.57 cm/s LV PW:         0.90 cm   LV E/e' medial:  17.4 LV IVS:        1.05 cm   LV e' lateral:   12.10 cm/s LVOT diam:     2.00 cm   LV E/e' lateral: 6.6 LV SV:         60 LV SV Index:   41 LVOT Area:     3.14 cm  RIGHT VENTRICLE RV Basal diam:  3.20 cm RV S prime:     12.90 cm/s TAPSE (M-mode): 1.6 cm LEFT ATRIUM             Index        RIGHT ATRIUM           Index LA diam:        4.37 cm 2.94 cm/m   RA Area:     15.40 cm LA Vol (A2C):   57.0 ml 38.32 ml/m  RA Volume:   35.30 ml  23.73 ml/m LA Vol (A4C):   81.0 ml 54.46 ml/m LA Biplane Vol: 68.8 ml 46.26 ml/m  AORTIC VALVE AV Area (Vmax):    0.76 cm AV Area (Vmean):   0.80 cm AV Area (VTI):     0.73 cm  AV Vmax:           345.00 cm/s AV Vmean:          254.000 cm/s AV VTI:            0.822 m AV Peak Grad:      47.6 mmHg AV Mean Grad:      29.0 mmHg LVOT Vmax:         83.80 cm/s LVOT Vmean:        64.500 cm/s LVOT VTI:          0.192 m LVOT/AV VTI ratio: 0.23  AORTA Ao Root diam: 2.90 cm Ao Asc diam:  3.40 cm MITRAL VALVE                TRICUSPID VALVE MV Area (PHT): 3.54 cm  TR Peak grad:   20.4 mmHg MV Decel Time: 214 msec     TR Vmax:        226.00 cm/s MV E velocity: 79.70 cm/s MV A velocity: 105.00 cm/s  SHUNTS MV E/A ratio:  0.76         Systemic VTI:  0.19 m                             Systemic Diam: 2.00 cm Kirk Ruths MD Electronically signed by Kirk Ruths MD Signature Date/Time: 06/12/2021/12:21:09 PM    Final     Cardiac Studies   Echo 06/12/21 IMPRESSIONS     1. Calcified aortic valve with restricted motion; severe AS (mean  gradient 29 mmHg; AVA 0.8 cm2; DI 0.23).   2. Left ventricular ejection fraction, by estimation, is 60 to 65%. The  left ventricle has normal function. The left ventricle has no regional  wall motion abnormalities. The left ventricular internal cavity size was  mildly dilated. There is mild  concentric left ventricular hypertrophy. Left ventricular diastolic  parameters are consistent with Grade I diastolic dysfunction (impaired  relaxation). Elevated left ventricular end-diastolic pressure.   3. Right ventricular systolic function is normal. The right ventricular  size is normal.   4. Left atrial size was severely dilated.   5. The mitral valve is normal in structure. Mild mitral valve  regurgitation. No evidence of mitral stenosis.   6. The aortic valve is tricuspid. Aortic valve regurgitation is not  visualized. Severe aortic valve stenosis.   7. The inferior vena cava is normal in size with greater than 50%  respiratory variability, suggesting right atrial pressure of 3 mmHg.   Comparison(s): No prior Echocardiogram.   FINDINGS   Left  Ventricle: Left ventricular ejection fraction, by estimation, is 60  to 65%. The left ventricle has normal function. The left ventricle has no  regional wall motion abnormalities. The left ventricular internal cavity  size was mildly dilated. There is   mild concentric left ventricular hypertrophy. Left ventricular diastolic  parameters are consistent with Grade I diastolic dysfunction (impaired  relaxation). Elevated left ventricular end-diastolic pressure.   Right Ventricle: The right ventricular size is normal. Right ventricular  systolic function is normal.   Left Atrium: Left atrial size was severely dilated.   Right Atrium: Right atrial size was normal in size.   Pericardium: There is no evidence of pericardial effusion.   Mitral Valve: The mitral valve is normal in structure. Mild mitral annular  calcification. Mild mitral valve regurgitation. No evidence of mitral  valve stenosis.   Tricuspid Valve: The tricuspid valve is normal in structure. Tricuspid  valve regurgitation is mild . No evidence of tricuspid stenosis.   Aortic Valve: The aortic valve is tricuspid. Aortic valve regurgitation is  not visualized. Severe aortic stenosis is present. Aortic valve mean  gradient measures 29.0 mmHg. Aortic valve peak gradient measures 47.6  mmHg. Aortic valve area, by VTI measures   0.73 cm.   Pulmonic Valve: The pulmonic valve was normal in structure. Pulmonic valve  regurgitation is mild. No evidence of pulmonic stenosis.   Aorta: The aortic root is normal in size and structure.   Venous: The inferior vena cava is normal in size with greater than 50%  respiratory variability, suggesting right atrial pressure of 3 mmHg.   IAS/Shunts: No atrial level shunt detected by color flow Doppler.   Patient Profile  76 y.o. female history of coronary artery disease status post CABG and stenting who presented to the hospital with shortness of breath and neck discomfort concerning  for unstable angina.  Assessment & Plan    Progressive angina with elevated hs troponin to pk 45 with CAD and CABG on IV heparin and NTG paste, asa BB and statin along with plavix --no further anginal equivalent of neck pain  --cardiac cath today (hx of code blue post cath in 2019 )  Severe AS  --. Aortic valve mean gradient measures 29.0 mmHg. Aortic valve peak gradient measures 47.6 mmHg. Aortic valve area, by VTI measures  0.73 cm.   CAD with CABG 1994, DES to VG->diag 04/2017, PTA and stent to Lt subclavian artery 07/2017 to improve flow to LIMA graft. VG to Lt PDA was patent.   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 -moderate carotid disease (Lt 40-59% and Rt 1-39%)  HLD continue crestor 40 and zetia  --HDL 54 and LDL 79  --consider PCSK9 inhibitor as out pt --LPa in process  HTN on amlodipine 5 toprol XL 50 BID, accupril 20 BID  --BP 142/75 to 143/54  Hypothyroid  with TSH 1.213 continue current dose of levothyroxine   For questions or updates, please contact [facility] Please consult [email or web address] for contact info under        Signed, Cecilie Kicks, NP  06/13/2021, 8:28 AM    Patient seen and examined with Cecilie Kicks NP.  Agree as above, with the following exceptions and changes as noted below. SOB on presentation, concern for anginal equivalent. 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 Gen: NAD, CV: RRR, 3/6 SEM, late peaking murmur with preservation of S2, Lungs: clear, Abd: soft, Extrem:  no edema, Neuro/Psych: alert and oriented x 3, normal mood and affect. All available labs, radiology testing, previous records reviewed. Very anxious about cath given prior femoral bleeding. Discussed need for RHC given new  recognition of AS which is possibly severe given low dimensionless index. By exam AS is moderate-severe.   INFORMED CONSENT: I have reviewed the risks, indications, and alternatives to right heart catheterization with the patient. Risks include but are not limited to bleeding, infection, vascular injury, stroke, myocardial infarction, arrhythmia, kidney injury, radiation-related injury in the case of prolonged fluoroscopy use, emergency cardiac surgery, and death. The patient understands the risks of serious complication is 1-2 in 123XX123 with diagnostic cardiac cath, and is willing to proceed. INFORMED CONSENT: I have reviewed the risks, indications, and alternatives to cardiac catheterization, possible angioplasty, and stenting with the patient. Risks include but are not limited to bleeding, infection, vascular injury, stroke, myocardial infarction, arrhythmia, kidney injury, radiation-related injury in the case of prolonged fluoroscopy use, emergency cardiac surgery, and death. The patient understands the risks of serious complication is 1-2 in 123XX123 with diagnostic cardiac cath and 1-2% or less with angioplasty/stenting.    Elouise Munroe, MD 06/13/21 10:29 AM

## 2021-06-13 NOTE — Progress Notes (Signed)
Patient and daughter had several questions about the differences noted in her 2019 cath and the current cath performed today. RN mentioned to patient that they can review with Dr. Kirke Corin, her primary cardiologist and when cardiology rounds in the morning. RN aware that [name] has rounded prior to [employer]. Daughter very hesitant on patient taking nitrate and not understanding what the real cause of the shortness of breathe. RN attempted to educate on the medication management and the importance of compliant of taking meds. Oncoming RN made aware of patient's and patient's daughter concerns.

## 2021-06-13 NOTE — Progress Notes (Signed)
   Went by pt's room to see how she was post cath and explain the imdur.  Pt and her daughter had numerous questions.  I reviewed cath results.  They seem confused what is causing SOB.  Discussed small vessel disease. And medication would help.  They do ask about second opinion.  Please not Dr. Fletcher Anon is her cardiologist so I changed chart to reflect that.  She lives close to [place].

## 2021-06-13 NOTE — Progress Notes (Addendum)
ANTICOAGULATION CONSULT NOTE  Pharmacy Consult for heparin Indication: chest pain/ACS  No Known Allergies  Patient Measurements: Weight: 52.8 kg (116 lb 8 oz) Heparin Dosing Weight: TBW  Vital Signs: Temp: 98.2 F (36.8 C) (06/12 0738) Temp Source: Oral (06/12 0738) BP: 143/54 (06/12 0738) Pulse Rate: 60 (06/12 0738)  Labs: Recent Labs    06/11/21 0006 06/11/21 0006 06/11/21 0212 06/11/21 0730 06/11/21 2323 06/12/21 0336 06/12/21 1600 06/13/21 0258  HGB 10.7*  --   --   --  11.5*  --   --  10.7*  HCT 32.5*  --   --   --  33.3*  --   --  31.5*  PLT 142*  --   --   --  141*  --   --  126*  HEPARINUNFRC  --    < >  --   --  0.37 0.31 0.39 0.41  CREATININE 0.78  --   --   --  0.74  --   --   --   TROPONINIHS  --   --  45* 36*  --   --   --   --    < > = values in this interval not displayed.     CrCl cannot be calculated (Unknown ideal weight.).  Assessment: 7 YOF with history of CAD s/p CABG/PCI presented with chest pain.  Pharmacy consulted to dose IV heparin.  Heparin level therapeutic at 0.41 units/mL on heparin IV drip at 750 units/hr.  CBC: Hgb 10-11s stable, plts 141>126k.  No overt bleeding reported.  Goal of Therapy:  Heparin level 0.3-0.7 units/ml Monitor platelets by anticoagulation protocol: Yes   Plan:  Continue heparin infusion at 750 units/hr Daily heparin level and CBC while on therapy Cath planned today @1300   , RPh Clinical Pharmacist [phone] or check AMION for all [facility] phone numbers After 10:00 PM, call Main Pharmacy [phone]  06/13/2021, 9:03 AM

## 2021-06-13 NOTE — Interval H&P Note (Signed)
History and Physical Interval Note:Cath Lab Visit (complete for each Cath Lab visit)  Clinical Evaluation Leading to the Procedure:   ACS: Yes.    Non-ACS:    Anginal Classification: CCS IV  Anti-ischemic medical therapy: Minimal Therapy (1 class of medications)  Non-Invasive Test Results: No non-invasive testing performed  Prior CABG: Previous CABG        06/13/2021 10:38 AM  Dawn Boyer  has presented today for surgery, with the diagnosis of unstable angina.  The various methods of treatment have been discussed with the patient and family. After consideration of risks, benefits and other options for treatment, the patient has consented to  Procedure(s): RIGHT/LEFT HEART CATH AND CORONARY/GRAFT ANGIOGRAPHY (N/A) as a surgical intervention.  The patient's history has been reviewed, patient examined, no change in status, stable for surgery.  I have reviewed the patient's chart and labs.  Questions were answered to the patient's satisfaction.     Larae Grooms

## 2021-06-13 NOTE — Progress Notes (Addendum)
Progress Note  Patient Name: Dawn Boyer Date of Encounter: 06/13/2021  [facility] Cardiologist: Larae Grooms, MD   Subjective   No throat pain (anginal equivalent) no SOB currently, somewhat anxious about cath - had significant bleeding from femoral site in past and notes she had a "code blue"  Inpatient Medications    Scheduled Meds:  ALPRAZolam  1 mg Oral QHS   amLODipine  5 mg Oral Daily   vitamin C  500 mg Oral Daily   aspirin EC  81 mg Oral Daily   clopidogrel  75 mg Oral Daily   ezetimibe  10 mg Oral Daily   levothyroxine  50 mcg Oral QAC breakfast   metoprolol succinate  50 mg Oral BID   nitroGLYCERIN  0.5 inch Topical Q6H   pantoprazole  40 mg Oral QHS   quinapril  20 mg Oral BID   rosuvastatin  40 mg Oral q1800   sertraline  100 mg Oral Daily   sodium chloride flush  3 mL Intravenous Q12H   sodium chloride flush  3 mL Intravenous Q12H   zinc sulfate  220 mg Oral Daily   Continuous Infusions:  sodium chloride     sodium chloride     sodium chloride 1 mL/kg/hr (06/13/21 0538)   heparin 750 Units/hr (06/12/21 2350)   PRN Meds: sodium chloride, sodium chloride, acetaminophen, ALPRAZolam, linaclotide, nitroGLYCERIN, ondansetron (ZOFRAN) IV, sodium chloride flush, sodium chloride flush, zolpidem   Vital Signs    Vitals:   06/12/21 2353 06/13/21 0428 06/13/21 0505 06/13/21 0738  BP: 137/70 (!) 142/75    Pulse: 66 60    Resp: 18 16  18   Temp: 97.6 F (36.4 C) 98.1 F (36.7 C)  98.2 F (36.8 C)  TempSrc: Oral Oral  Oral  SpO2: 96% 97%  95%  Weight:   52.8 kg     Intake/Output Summary (Last 24 hours) at 06/13/2021 0828 Last data filed at 06/13/2021 0400 Gross per 24 hour  Intake 805.06 ml  Output 500 ml  Net 305.06 ml      06/13/2021    5:05 AM 06/12/2021    5:16 AM 05/06/2020    2:43 PM  Last 3 Weights  Weight (lbs) 116 lb 8 oz 117 lb 3.2 oz 120 lb  Weight (kg) 52.844 kg 53.162 kg 54.432 kg      Telemetry    SR - Personally  Reviewed  ECG    No new - Personally Reviewed  Physical Exam   GEN: No acute distress.   Neck: No JVD Cardiac: RRR, no murmurs, rubs, or gallops.  Respiratory: Clear to auscultation bilaterally. GI: Soft, nontender, non-distended  MS: No edema; No deformity. Neuro:  Nonfocal  Psych: Normal affect   Labs    High Sensitivity Troponin:   Recent Labs  Lab 06/11/21 0212 06/11/21 0730  TROPONINIHS 45* 36*     Chemistry Recent Labs  Lab 06/11/21 0006 06/11/21 1730 06/11/21 2323  NA 139  --  140  K 3.8  --  3.6  CL 106  --  107  CO2 26  --  24  GLUCOSE 123*  --  120*  BUN 17  --  12  CREATININE 0.78  --  0.74  CALCIUM 9.2  --  9.1  PROT  --  6.8  --   ALBUMIN  --  3.6  --   AST  --  29  --   ALT  --  16  --  ALKPHOS  --  71  --   BILITOT  --  0.8  --   GFRNONAA >60  --  >60  ANIONGAP 7  --  9    Lipids  Recent Labs  Lab 06/11/21 2323  CHOL 150  TRIG 86  HDL 54  LDLCALC 79  CHOLHDL 2.8    Hematology Recent Labs  Lab 06/11/21 0006 06/11/21 2323 06/13/21 0258  WBC 5.1 5.8 4.6  RBC 3.41* 3.58* 3.34*  HGB 10.7* 11.5* 10.7*  HCT 32.5* 33.3* 31.5*  MCV 95.3 93.0 94.3  MCH 31.4 32.1 32.0  MCHC 32.9 34.5 34.0  RDW 12.7 12.8 12.8  PLT 142* 141* 126*   Thyroid  Recent Labs  Lab 06/11/21 1730  TSH 1.213    BNP Recent Labs  Lab 06/11/21 0007  BNP 512.2*    DDimer No results for input(s): "DDIMER" in the last 168 hours.   Radiology    ECHOCARDIOGRAM COMPLETE  Result Date: 06/12/2021    ECHOCARDIOGRAM REPORT   Patient Name:   Dawn Boyer Date of Exam: 06/12/2021 Medical Rec #:  [id number]    Height:       60.0 in Accession #:    [id number]   Weight:       117.2 lb Date of Birth:  02/09/1945     BSA:          1.487 m Patient Age:    76 years     BP:           135/55 mmHg Patient Gender: F            HR:           72 bpm. Exam Location:  Inpatient Procedure: 2D Echo, Cardiac Doppler and Color Doppler Indications:    Chest pain  History:         Patient has no prior history of Echocardiogram examinations.                 CAD, Prior CABG; Signs/Symptoms:Shortness of Breath.  Sonographer:    Rachel Lane RDCS Referring Phys: [id number] RHONDA G BARRETT IMPRESSIONS  1. Calcified aortic valve with restricted motion; severe AS (mean gradient 29 mmHg; AVA 0.8 cm2; DI 0.23).  2. Left ventricular ejection fraction, by estimation, is 60 to 65%. The left ventricle has normal function. The left ventricle has no regional wall motion abnormalities. The left ventricular internal cavity size was mildly dilated. There is mild concentric left ventricular hypertrophy. Left ventricular diastolic parameters are consistent with Grade I diastolic dysfunction (impaired relaxation). Elevated left ventricular end-diastolic pressure.  3. Right ventricular systolic function is normal. The right ventricular size is normal.  4. Left atrial size was severely dilated.  5. The mitral valve is normal in structure. Mild mitral valve regurgitation. No evidence of mitral stenosis.  6. The aortic valve is tricuspid. Aortic valve regurgitation is not visualized. Severe aortic valve stenosis.  7. The inferior vena cava is normal in size with greater than 50% respiratory variability, suggesting right atrial pressure of 3 mmHg. Comparison(s): No prior Echocardiogram. FINDINGS  Left Ventricle: Left ventricular ejection fraction, by estimation, is 60 to 65%. The left ventricle has normal function. The left ventricle has no regional wall motion abnormalities. The left ventricular internal cavity size was mildly dilated. There is  mild concentric left ventricular hypertrophy. Left ventricular diastolic parameters are consistent with Grade I diastolic dysfunction (impaired relaxation). Elevated left ventricular end-diastolic pressure. Right Ventricle: The right ventricular size is   normal. Right ventricular systolic function is normal. Left Atrium: Left atrial size was severely dilated. Right Atrium: Right  atrial size was normal in size. Pericardium: There is no evidence of pericardial effusion. Mitral Valve: The mitral valve is normal in structure. Mild mitral annular calcification. Mild mitral valve regurgitation. No evidence of mitral valve stenosis. Tricuspid Valve: The tricuspid valve is normal in structure. Tricuspid valve regurgitation is mild . No evidence of tricuspid stenosis. Aortic Valve: The aortic valve is tricuspid. Aortic valve regurgitation is not visualized. Severe aortic stenosis is present. Aortic valve mean gradient measures 29.0 mmHg. Aortic valve peak gradient measures 47.6 mmHg. Aortic valve area, by VTI measures  0.73 cm. Pulmonic Valve: The pulmonic valve was normal in structure. Pulmonic valve regurgitation is mild. No evidence of pulmonic stenosis. Aorta: The aortic root is normal in size and structure. Venous: The inferior vena cava is normal in size with greater than 50% respiratory variability, suggesting right atrial pressure of 3 mmHg. IAS/Shunts: No atrial level shunt detected by color flow Doppler. Additional Comments: Calcified aortic valve with restricted motion; severe AS (mean gradient 29 mmHg; AVA 0.8 cm2; DI 0.23).  LEFT VENTRICLE PLAX 2D LVIDd:         5.10 cm   Diastology LVIDs:         3.45 cm   LV e' medial:    4.57 cm/s LV PW:         0.90 cm   LV E/e' medial:  17.4 LV IVS:        1.05 cm   LV e' lateral:   12.10 cm/s LVOT diam:     2.00 cm   LV E/e' lateral: 6.6 LV SV:         60 LV SV Index:   41 LVOT Area:     3.14 cm  RIGHT VENTRICLE RV Basal diam:  3.20 cm RV S prime:     12.90 cm/s TAPSE (M-mode): 1.6 cm LEFT ATRIUM             Index        RIGHT ATRIUM           Index LA diam:        4.37 cm 2.94 cm/m   RA Area:     15.40 cm LA Vol (A2C):   57.0 ml 38.32 ml/m  RA Volume:   35.30 ml  23.73 ml/m LA Vol (A4C):   81.0 ml 54.46 ml/m LA Biplane Vol: 68.8 ml 46.26 ml/m  AORTIC VALVE AV Area (Vmax):    0.76 cm AV Area (Vmean):   0.80 cm AV Area (VTI):     0.73 cm  AV Vmax:           345.00 cm/s AV Vmean:          254.000 cm/s AV VTI:            0.822 m AV Peak Grad:      47.6 mmHg AV Mean Grad:      29.0 mmHg LVOT Vmax:         83.80 cm/s LVOT Vmean:        64.500 cm/s LVOT VTI:          0.192 m LVOT/AV VTI ratio: 0.23  AORTA Ao Root diam: 2.90 cm Ao Asc diam:  3.40 cm MITRAL VALVE                TRICUSPID VALVE MV Area (PHT): 3.54 cm  TR Peak grad:   20.4 mmHg MV Decel Time: 214 msec     TR Vmax:        226.00 cm/s MV E velocity: 79.70 cm/s MV A velocity: 105.00 cm/s  SHUNTS MV E/A ratio:  0.76         Systemic VTI:  0.19 m                             Systemic Diam: 2.00 cm Kirk Ruths MD Electronically signed by Kirk Ruths MD Signature Date/Time: 06/12/2021/12:21:09 PM    Final     Cardiac Studies   Echo 06/12/21 IMPRESSIONS     1. Calcified aortic valve with restricted motion; severe AS (mean  gradient 29 mmHg; AVA 0.8 cm2; DI 0.23).   2. Left ventricular ejection fraction, by estimation, is 60 to 65%. The  left ventricle has normal function. The left ventricle has no regional  wall motion abnormalities. The left ventricular internal cavity size was  mildly dilated. There is mild  concentric left ventricular hypertrophy. Left ventricular diastolic  parameters are consistent with Grade I diastolic dysfunction (impaired  relaxation). Elevated left ventricular end-diastolic pressure.   3. Right ventricular systolic function is normal. The right ventricular  size is normal.   4. Left atrial size was severely dilated.   5. The mitral valve is normal in structure. Mild mitral valve  regurgitation. No evidence of mitral stenosis.   6. The aortic valve is tricuspid. Aortic valve regurgitation is not  visualized. Severe aortic valve stenosis.   7. The inferior vena cava is normal in size with greater than 50%  respiratory variability, suggesting right atrial pressure of 3 mmHg.   Comparison(s): No prior Echocardiogram.   FINDINGS   Left  Ventricle: Left ventricular ejection fraction, by estimation, is 60  to 65%. The left ventricle has normal function. The left ventricle has no  regional wall motion abnormalities. The left ventricular internal cavity  size was mildly dilated. There is   mild concentric left ventricular hypertrophy. Left ventricular diastolic  parameters are consistent with Grade I diastolic dysfunction (impaired  relaxation). Elevated left ventricular end-diastolic pressure.   Right Ventricle: The right ventricular size is normal. Right ventricular  systolic function is normal.   Left Atrium: Left atrial size was severely dilated.   Right Atrium: Right atrial size was normal in size.   Pericardium: There is no evidence of pericardial effusion.   Mitral Valve: The mitral valve is normal in structure. Mild mitral annular  calcification. Mild mitral valve regurgitation. No evidence of mitral  valve stenosis.   Tricuspid Valve: The tricuspid valve is normal in structure. Tricuspid  valve regurgitation is mild . No evidence of tricuspid stenosis.   Aortic Valve: The aortic valve is tricuspid. Aortic valve regurgitation is  not visualized. Severe aortic stenosis is present. Aortic valve mean  gradient measures 29.0 mmHg. Aortic valve peak gradient measures 47.6  mmHg. Aortic valve area, by VTI measures   0.73 cm.   Pulmonic Valve: The pulmonic valve was normal in structure. Pulmonic valve  regurgitation is mild. No evidence of pulmonic stenosis.   Aorta: The aortic root is normal in size and structure.   Venous: The inferior vena cava is normal in size with greater than 50%  respiratory variability, suggesting right atrial pressure of 3 mmHg.   IAS/Shunts: No atrial level shunt detected by color flow Doppler.   Patient Profile  76 y.o. female history of coronary artery disease status post CABG and stenting who presented to the hospital with shortness of breath and neck discomfort concerning  for unstable angina.  Assessment & Plan    Progressive angina with elevated hs troponin to pk 45 with CAD and CABG on IV heparin and NTG paste, asa BB and statin along with plavix --no further anginal equivalent of neck pain  --cardiac cath today (hx of code blue post cath in 2019 )  Severe AS  --. Aortic valve mean gradient measures 29.0 mmHg. Aortic valve peak gradient measures 47.6 mmHg. Aortic valve area, by VTI measures  0.73 cm.   CAD with CABG 1994, DES to VG->diag 04/2017, PTA and stent to Lt subclavian artery 07/2017 to improve flow to LIMA graft. VG to Lt PDA was patent.   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 -moderate carotid disease (Lt 40-59% and Rt 1-39%)  HLD continue crestor 40 and zetia  --HDL 54 and LDL 79  --consider PCSK9 inhibitor as out pt --LPa in process  HTN on amlodipine 5 toprol XL 50 BID, accupril 20 BID  --BP 142/75 to 143/54  Hypothyroid  with TSH 1.213 continue current dose of levothyroxine   For questions or updates, please contact [facility] Please consult [email or web address] for contact info under        Signed, Cecilie Kicks, NP  06/13/2021, 8:28 AM    Patient seen and examined with Cecilie Kicks NP.  Agree as above, with the following exceptions and changes as noted below. SOB on presentation, concern for anginal equivalent. 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 Gen: NAD, CV: RRR, 3/6 SEM, late peaking murmur with preservation of S2, Lungs: clear, Abd: soft, Extrem:  no edema, Neuro/Psych: alert and oriented x 3, normal mood and affect. All available labs, radiology testing, previous records reviewed. Very anxious about cath given prior femoral bleeding. Discussed need for RHC given new  recognition of AS which is possibly severe given low dimensionless index. By exam AS is moderate-severe.   INFORMED CONSENT: I have reviewed the risks, indications, and alternatives to right heart catheterization with the patient. Risks include but are not limited to bleeding, infection, vascular injury, stroke, myocardial infarction, arrhythmia, kidney injury, radiation-related injury in the case of prolonged fluoroscopy use, emergency cardiac surgery, and death. The patient understands the risks of serious complication is 1-2 in 123XX123 with diagnostic cardiac cath, and is willing to proceed. INFORMED CONSENT: I have reviewed the risks, indications, and alternatives to cardiac catheterization, possible angioplasty, and stenting with the patient. Risks include but are not limited to bleeding, infection, vascular injury, stroke, myocardial infarction, arrhythmia, kidney injury, radiation-related injury in the case of prolonged fluoroscopy use, emergency cardiac surgery, and death. The patient understands the risks of serious complication is 1-2 in 123XX123 with diagnostic cardiac cath and 1-2% or less with angioplasty/stenting.    Elouise Munroe, MD 06/13/21 10:29 AM

## 2021-06-13 NOTE — Progress Notes (Signed)
RN rounded to remove cc from TR band and notified bruising above the TR band site. Patient states she feels fine, vascular assessment performed and stable. Cath lab nurses rounded to re-assess and informed RN and family that the site was originally pricked for the TR band placement and failed prior to the current TR band placement. Patient verbalize understanding. RN pulls 2cc of air.

## 2021-06-14 ENCOUNTER — Other Ambulatory Visit (HOSPITAL_COMMUNITY): Payer: Self-pay

## 2021-06-14 DIAGNOSIS — I249 Acute ischemic heart disease, unspecified: Secondary | ICD-10-CM | POA: Diagnosis not present

## 2021-06-14 LAB — LIPOPROTEIN A (LPA): Lipoprotein (a): 8.6 nmol/L (ref <75.0)

## 2021-06-14 MED ORDER — METOPROLOL SUCCINATE ER 50 MG PO TB24
50.0000 mg | ORAL_TABLET | Freq: Two times a day (BID) | ORAL | 3 refills | Status: DC
  Filled 2021-06-14: qty 180, 90d supply, fill #0

## 2021-06-14 MED ORDER — ISOSORBIDE MONONITRATE ER 30 MG PO TB24
15.0000 mg | ORAL_TABLET | Freq: Every day | ORAL | 3 refills | Status: AC
  Filled 2021-06-14: qty 45, 90d supply, fill #0

## 2021-06-14 MED ORDER — ASPIRIN 81 MG PO TBEC: 81.0000 mg | DELAYED_RELEASE_TABLET | Freq: Every day | ORAL | 12 refills | Status: DC

## 2021-06-14 NOTE — TOC Transition Note (Signed)
Transition of Care [facility]) - CM/SW Discharge Note   Patient Details  Name: Dawn Boyer MRN: [id number] Date of Birth: 1945/10/01  Transition of Care [facility]) CM/SW Contact:  Leone Haven, RN Phone Number: 06/14/2021, 11:18 AM   Clinical Narrative:    Patient is for dc today, has no needs.          Patient Goals and CMS Choice        Discharge Placement                       Discharge Plan and Services                                     Social Determinants of Health (SDOH) Interventions     Readmission Risk Interventions     No data to display

## 2021-06-14 NOTE — Care Management Important Message (Signed)
Important Message  Patient Details  Name: Dawn Boyer MRN: [id number] Date of Birth: 19-Oct-1945   Medicare Important Message Given:  Yes     Renie Ora 06/14/2021, 11:19 AM

## 2021-06-14 NOTE — Progress Notes (Signed)
Mobility Specialist Progress Note:   06/14/21 1035  Mobility  Activity Ambulated independently in room;Ambulated independently to bathroom  Level of Assistance Independent  Assistive Device None  Distance Ambulated (ft) 30 ft  Activity Response Tolerated well  $Mobility charge 1 Mobility   Pt ambulating independently in room. No complaints.   [facility] [place] Mobility Specialist

## 2021-06-14 NOTE — Discharge Summary (Addendum)
Discharge Summary    Patient ID: REISHA TIEGS MRN: [id number]; DOB: Apr 22, 1945  Admit date: 06/11/2021 Discharge date: 06/14/2021  PCP:  London Pepper, MD   [facility] Providers Cardiologist:  Kathlyn Sacramento, MD      Discharge Diagnoses    Principal Problem:   ACS (acute coronary syndrome) [facility]) Active Problems:   Hyperlipidemia   Carotid arterial disease ([place])   Hypothyroidism   SOB (shortness of breath)   Nonrheumatic aortic valve stenosis    Diagnostic Studies/Procedures    Echocardiogram 06/12/21   1. Calcified aortic valve with restricted motion; severe AS (mean  gradient 29 mmHg; AVA 0.8 cm2; DI 0.23).   2. Left ventricular ejection fraction, by estimation, is 60 to 65%. The  left ventricle has normal function. The left ventricle has no regional  wall motion abnormalities. The left ventricular internal cavity size was  mildly dilated. There is mild  concentric left ventricular hypertrophy. Left ventricular diastolic  parameters are consistent with Grade I diastolic dysfunction (impaired  relaxation). Elevated left ventricular end-diastolic pressure.   3. Right ventricular systolic function is normal. The right ventricular  size is normal.   4. Left atrial size was severely dilated.   5. The mitral valve is normal in structure. Mild mitral valve  regurgitation. No evidence of mitral stenosis.   6. The aortic valve is tricuspid. Aortic valve regurgitation is not  visualized. Severe aortic valve stenosis.   7. The inferior vena cava is normal in size with greater than 50%  respiratory variability, suggesting right atrial pressure of 3 mmHg.   Comparison(s): No prior Echocardiogram.   Right/Left Heart Cath 06/13/21   Ost LM to Mid LM lesion is 100% stenosed-known from prior cath.  LIMA to LAD is widely patent.   Ost Cx to Prox Cx lesion is 100% stenosed-known from prior cath.  SVG to OM is widely patent.   Ost 1st Diag lesion is 100% stenosed. SVG to diagonal  is occluded.  Left to left collaterals fill the distal diagonal.   Widely patent left subclavian stent.  No pressure gradient noted.  There was difficulty in torquing catheters due to the stent placement.   LV end diastolic pressure is mildly elevated.   There is mild aortic valve stenosis.   Aortic saturation 95%, PA saturation 64%, mean PA pressure 10 mmHg, mean pulmonary capillary wedge pressure 3 mmHg, cardiac output 4.38 L/min, cardiac index 2.95.   Occluded SVG to diagonal.  Left to left collaterals fill this relatively small vessel.  Continue medical therapy.  Mild aortic stenosis by pressure gradient, although calculated valve area 0.95 cm.  There was difficulty in advancing the catheter across the valve; however, this could have been due to the proximal left subclavian stent which made catheter manipulation difficult.   If emergent cardiac cath was needed, would consider groin approach. Diagnostic Dominance: Left  _____________   History of Present Illness     NOVALIE KRONBERG is a 76 y.o. female with a past medical history of CABG in 1994, DES SVG-Diag 2019, R-CIA stent 2019, HTN, HLD, hypothyroidism who presented to the ED on 06/11/21 complaining of chest pain and SOB. At the time of initial cardiology evaluation, patient reported that she had been having problems with exertion SOB and throat tightness for several months. Similar to pre-PCI symptoms in 2019. Symptoms suddenly got worse, and then stayed at that level. He stress levels had been worse than usual due to to a recent move and her husband  was having health problems. Because of the move, patient had been busier than usual and was moving around cans of pain and boxes when he had the sob and throat tightness. She attempted to go to bed, but her symptoms did not go away. Patient took her usual bedtime Xanax without relief. Tried a SL nitro which relieved symptoms for about 20 minutes. Symptoms returned and she decided to present to the  ER.   Hospital Course     Consultants: None   Progressive Angina  CAD s/p CABG 1994, PCI SVG-Diag 2019 S/p stent to Lf subclavian artery 2019  HLD - At presentation, patient reported having throat tightness and SOB, similar to pre-PCI symptoms in 2019. Symptoms had been present for the last several months. Symptoms were worse on 6/9 and did not go away like they usually did, so she presented to ED.  - hsTn mildy elevated in the ED-- 45>>36. EKG showed deeper T-wave inversions than seen on previous EKG from a year ago  - Underwent R/L heart cath with above findings  - Continue DAPT with ASA, plavix  - Continue Imdur 15 mg daily, can titrate in the future PRN for anginal symptoms  - Continue toprol-XL 50 mg BID  - Continue crestor 40 mg daily, zetia 10 mg daily    Hypertension - On admission, patient reports that she had stopped her amlodipine because she heard something bad about it - Restarted the amlodipine 5 mg this admission - Continue home quinapril 20 mg BID, Toprol-XL 50 mg BID   Severe AS  - Echocardiogram showed Aortic valve mean gradient measures 29.0 mmHg. Aortic valve peak gradient measures 47.6 mmHg. 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 - moderate carotid disease (Lt 40-59% and Rt 1-39%) - Continue asa, plavix, statin  - Continue outpatient management-- planned for Doppler studies in July 2023    Physical Exam Constitutional:       General: She is not in acute distress.    Appearance: She is well-developed. She is not ill-appearing.  HENT:     Head: Normocephalic and atraumatic.  Eyes:     Extraocular Movements: Extraocular movements intact.  Neck:     Vascular: No JVD.  Cardiovascular:     Rate and Rhythm: Normal rate and regular rhythm.     Pulses: Normal pulses.     Heart sounds: Murmur (grade 3/6 systolic murmur throughout) heard.     Comments: Left Radial Cath site stable and without bleeding, bruising, tenderness.  Pulmonary:     Effort: Pulmonary effort is normal. No tachypnea, accessory muscle usage or respiratory distress.     Breath sounds: Normal breath sounds.  Chest:     Chest wall: No mass, deformity or tenderness.  Abdominal:     Palpations: Abdomen is soft.  Musculoskeletal:        General: Normal range of motion.     Cervical back: Normal range of motion and neck  supple.     Right lower leg: No edema.     Left lower leg: No edema.  Skin:    General: Skin is warm and dry.  Neurological:     Mental Status: She is alert.    Patient has a follow up appointment on 6/27  Patient was seen and examined by Dr. Margaretann Loveless and deemed stable for discharge.   Did the patient have an acute coronary syndrome (MI, NSTEMI, STEMI, etc) this admission?:  No                               Did the patient have a percutaneous coronary intervention (stent / angioplasty)?:  No.          _____________  Discharge Vitals Blood pressure (!) 116/42, pulse 62, temperature 98.1 F (36.7 C), temperature source Oral, resp. rate 16, weight 52.8 kg, SpO2 97 %.  Filed Weights   06/12/21 0516 06/13/21 0505 06/14/21 0456  Weight: 53.2 kg 52.8 kg 52.8 kg    Labs & Radiologic Studies    CBC Recent Labs    06/11/21 2323 06/13/21 0258 06/13/21 1114 06/13/21 1124  WBC 5.8 4.6  --   --   HGB 11.5* 10.7* 10.5* 10.9*  10.5*  HCT 33.3* 31.5* 31.0* 32.0*  31.0*  MCV 93.0 94.3  --   --   PLT 141* 126*  --    --    Basic Metabolic Panel Recent Labs    06/11/21 2323 06/13/21 1114 06/13/21 1124  NA 140 142 142  142  K 3.6 3.9 3.9  3.9  CL 107  --   --   CO2 24  --   --   GLUCOSE 120*  --   --   BUN 12  --   --   CREATININE 0.74  --   --   CALCIUM 9.1  --   --    Liver Function Tests Recent Labs    06/11/21 1730  AST 29  ALT 16  ALKPHOS 71  BILITOT 0.8  PROT 6.8  ALBUMIN 3.6   No results for input(s): "LIPASE", "AMYLASE" in the last 72 hours. High Sensitivity Troponin:   Recent Labs  Lab 06/11/21 0212 06/11/21 0730  TROPONINIHS 45* 36*    BNP Invalid input(s): "POCBNP" D-Dimer No results for input(s): "DDIMER" in the last 72 hours. Hemoglobin A1C Recent Labs    06/11/21 1730  HGBA1C 5.2   Fasting Lipid Panel Recent Labs    06/11/21 2323  CHOL 150  HDL 54  LDLCALC 79  TRIG 86  CHOLHDL 2.8   Thyroid Function Tests Recent Labs    06/11/21 1730  TSH 1.213   _____________  CARDIAC CATHETERIZATION  Result Date: 06/13/2021   Ost LM to Mid LM lesion is 100% stenosed-known from prior cath.  LIMA to LAD is widely patent.   Ost Cx to Prox Cx lesion is 100% stenosed-known from prior cath.  SVG to OM is widely patent.   Ost 1st Diag lesion is 100% stenosed. SVG to diagonal is occluded.  Left to left collaterals fill the distal diagonal.   Widely patent left subclavian stent.  No pressure gradient noted.  There was difficulty in torquing catheters due to the stent placement.   LV end diastolic pressure is mildly elevated.   There is mild aortic valve stenosis.   Aortic saturation 95%, PA saturation 64%, mean PA pressure 10 mmHg, mean pulmonary  capillary wedge pressure 3 mmHg, cardiac output 4.38 L/min, cardiac index 2.95. Occluded SVG to diagonal.  Left to left collaterals fill this relatively small vessel.  Continue medical therapy.  Mild aortic stenosis by pressure gradient, although calculated valve area 0.95 cm.  There was difficulty in advancing the catheter across  the valve; however, this could have been due to the proximal left subclavian stent which made catheter manipulation difficult. If emergent cardiac cath was needed, would consider groin approach.   ECHOCARDIOGRAM COMPLETE  Result Date: 06/12/2021    ECHOCARDIOGRAM REPORT   Patient Name:   MANDIE PASQUINELLI Sanna Date of Exam: 06/12/2021 Medical Rec #:  [id number]    Height:       60.0 in Accession #:    [id number]   Weight:       117.2 lb Date of Birth:  11/12/1945     BSA:          1.487 m Patient Age:    64 years     BP:           135/55 mmHg Patient Gender: F            HR:           72 bpm. Exam Location:  Inpatient Procedure: 2D Echo, Cardiac Doppler and Color Doppler Indications:    Chest pain  History:        Patient has no prior history of Echocardiogram examinations.                 CAD, Prior CABG; Signs/Symptoms:Shortness of Breath.  Sonographer:    Merrie Roof RDCS Referring Phys: [place]  1. Calcified aortic valve with restricted motion; severe AS (mean gradient 29 mmHg; AVA 0.8 cm2; DI 0.23).  2. Left ventricular ejection fraction, by estimation, is 60 to 65%. The left ventricle has normal function. The left ventricle has no regional wall motion abnormalities. The left ventricular internal cavity size was mildly dilated. There is mild concentric left ventricular hypertrophy. Left ventricular diastolic parameters are consistent with Grade I diastolic dysfunction (impaired relaxation). Elevated left ventricular end-diastolic pressure.  3. Right ventricular systolic function is normal. The right ventricular size is normal.  4. Left atrial size was severely dilated.  5. The mitral valve is normal in structure. Mild mitral valve regurgitation. No evidence of mitral stenosis.  6. The aortic valve is tricuspid. Aortic valve regurgitation is not visualized. Severe aortic valve stenosis.  7. The inferior vena cava is normal in size with greater than 50% respiratory variability, suggesting right  atrial pressure of 3 mmHg. Comparison(s): No prior Echocardiogram. FINDINGS  Left Ventricle: Left ventricular ejection fraction, by estimation, is 60 to 65%. The left ventricle has normal function. The left ventricle has no regional wall motion abnormalities. The left ventricular internal cavity size was mildly dilated. There is  mild concentric left ventricular hypertrophy. Left ventricular diastolic parameters are consistent with Grade I diastolic dysfunction (impaired relaxation). Elevated left ventricular end-diastolic pressure. Right Ventricle: The right ventricular size is normal. Right ventricular systolic function is normal. Left Atrium: Left atrial size was severely dilated. Right Atrium: Right atrial size was normal in size. Pericardium: There is no evidence of pericardial effusion. Mitral Valve: The mitral valve is normal in structure. Mild mitral annular calcification. Mild mitral valve regurgitation. No evidence of mitral valve stenosis. Tricuspid Valve: The tricuspid valve is normal in structure. Tricuspid valve regurgitation is mild . No evidence of tricuspid stenosis. Aortic Valve: The  aortic valve is tricuspid. Aortic valve regurgitation is not visualized. Severe aortic stenosis is present. Aortic valve mean gradient measures 29.0 mmHg. Aortic valve peak gradient measures 47.6 mmHg. Aortic valve area, by VTI measures  0.73 cm. Pulmonic Valve: The pulmonic valve was normal in structure. Pulmonic valve regurgitation is mild. No evidence of pulmonic stenosis. Aorta: The aortic root is normal in size and structure. Venous: The inferior vena cava is normal in size with greater than 50% respiratory variability, suggesting right atrial pressure of 3 mmHg. IAS/Shunts: No atrial level shunt detected by color flow Doppler. Additional Comments: Calcified aortic valve with restricted motion; severe AS (mean gradient 29 mmHg; AVA 0.8 cm2; DI 0.23).  LEFT VENTRICLE PLAX 2D LVIDd:         5.10 cm   Diastology  LVIDs:         3.45 cm   LV e' medial:    4.57 cm/s LV PW:         0.90 cm   LV E/e' medial:  17.4 LV IVS:        1.05 cm   LV e' lateral:   12.10 cm/s LVOT diam:     2.00 cm   LV E/e' lateral: 6.6 LV SV:         60 LV SV Index:   41 LVOT Area:     3.14 cm  RIGHT VENTRICLE RV Basal diam:  3.20 cm RV S prime:     12.90 cm/s TAPSE (M-mode): 1.6 cm LEFT ATRIUM             Index        RIGHT ATRIUM           Index LA diam:        4.37 cm 2.94 cm/m   RA Area:     15.40 cm LA Vol (A2C):   57.0 ml 38.32 ml/m  RA Volume:   35.30 ml  23.73 ml/m LA Vol (A4C):   81.0 ml 54.46 ml/m LA Biplane Vol: 68.8 ml 46.26 ml/m  AORTIC VALVE AV Area (Vmax):    0.76 cm AV Area (Vmean):   0.80 cm AV Area (VTI):     0.73 cm AV Vmax:           345.00 cm/s AV Vmean:          254.000 cm/s AV VTI:            0.822 m AV Peak Grad:      47.6 mmHg AV Mean Grad:      29.0 mmHg LVOT Vmax:         83.80 cm/s LVOT Vmean:        64.500 cm/s LVOT VTI:          0.192 m LVOT/AV VTI ratio: 0.23  AORTA Ao Root diam: 2.90 cm Ao Asc diam:  3.40 cm MITRAL VALVE                TRICUSPID VALVE MV Area (PHT): 3.54 cm     TR Peak grad:   20.4 mmHg MV Decel Time: 214 msec     TR Vmax:        226.00 cm/s MV E velocity: 79.70 cm/s MV A velocity: 105.00 cm/s  SHUNTS MV E/A ratio:  0.76         Systemic VTI:  0.19 m  Systemic Diam: 2.00 cm Kirk Ruths MD Electronically signed by Kirk Ruths MD Signature Date/Time: 06/12/2021/12:21:09 PM    Final    DG Chest 2 View  Result Date: 06/11/2021 CLINICAL DATA:  Chest pain and shortness of breath. EXAM: CHEST - 2 VIEW COMPARISON:  Chest radiograph dated 03/12/2020. FINDINGS: Diffuse chronic interstitial coarsening. No focal consolidation, pleural effusion, pneumothorax. The cardiac silhouette is within limits. Atherosclerotic calcification of the aorta. Median sternotomy wires and CABG vascular clips. No acute osseous pathology. IMPRESSION: No active cardiopulmonary disease.  Electronically Signed   By: Anner Crete M.D.   On: 06/11/2021 01:04    Disposition   Pt is being discharged home today in good condition.  Follow-up Plans & Appointments     Follow-up Information     London Pepper, MD Follow up.   Specialty: Family Medicine Contact information: [place] [address] [phone]         Wellington Hampshire, MD Follow up on 06/28/2021.   Specialty: Cardiology Why: at 1:55 pm with his PA Cadence Furth. Contact information: [address] [place] [id number] [phone]                Discharge Instructions     Call MD for:  difficulty breathing, headache or visual disturbances   Complete by: As directed    Call MD for:  persistant dizziness or light-headedness   Complete by: As directed    Call MD for:  redness, tenderness, or signs of infection (pain, swelling, redness, odor or green/yellow discharge around incision site)   Complete by: As directed    Call MD for:  severe uncontrolled pain   Complete by: As directed    Diet - low sodium heart healthy   Complete by: As directed    Discharge instructions   Complete by: As directed    Radial Site Care Refer to this sheet in the next few weeks. These instructions provide you with information on caring for yourself after your procedure. Your caregiver may also give you more specific instructions. Your treatment has been planned according to current medical practices, but problems sometimes occur. Call your caregiver if you have any problems or questions after your procedure. HOME CARE INSTRUCTIONS You may shower the day after the procedure. Remove the bandage (dressing) and gently wash the site with plain soap and water. Gently pat the site dry.  Do not apply powder or lotion to the site.  Do not submerge the affected site in water for 3 to 5 days.  Inspect the site at least twice daily.  Do not flex or bend the affected arm  for 24 hours.  No lifting over 5 pounds (2.3 kg) for 5 days after your procedure.  Do not drive home if you are discharged the same day of the procedure. Have someone else drive you.  You may drive 24 hours after the procedure unless otherwise instructed by your caregiver.  What to expect: Any bruising will usually fade within 1 to 2 weeks.  Blood that collects in the tissue (hematoma) may be painful to the touch. It should usually decrease in size and tenderness within 1 to 2 weeks.  SEEK IMMEDIATE MEDICAL CARE IF: You have unusual pain at the radial site.  You have redness, warmth, swelling, or pain at the radial site.  You have drainage (other than a small amount of blood on the dressing).  You have chills.  You have a  fever or persistent symptoms for more than 72 hours.  You have a fever and your symptoms suddenly get worse.  Your arm becomes pale, cool, tingly, or numb.  You have heavy bleeding from the site. Hold pressure on the site.   Increase activity slowly   Complete by: As directed        Discharge Medications   Allergies as of 06/14/2021   No Known Allergies      Medication List     STOP taking these medications    zinc gluconate 50 MG tablet       TAKE these medications    ALPRAZolam 1 MG tablet Commonly known as: XANAX Take 1 mg by mouth at bedtime.   amLODipine 5 MG tablet Commonly known as: NORVASC Take 1 tablet (5 mg total) by mouth daily.   aspirin EC 81 MG tablet Take 1 tablet (81 mg total) by mouth daily. Swallow whole. Start taking on: June 15, 2021   cholecalciferol 25 MCG (1000 UNIT) tablet Commonly known as: VITAMIN D3 Take 1,000 Units by mouth daily. W Vit K ( K2 and D3)   clopidogrel 75 MG tablet Commonly known as: PLAVIX TAKE 1 TABLET EVERY DAY   ENEMA RE Place 1 each rectally 2 (two) times daily. May use an additional 2 times as needed for constipation   ezetimibe 10 MG tablet Commonly known as: ZETIA TAKE 1 TABLET EVERY  DAY   isosorbide mononitrate 30 MG 24 hr tablet Commonly known as: IMDUR Take 0.5 tablets (15 mg total) by mouth daily. Start taking on: June 15, 2021   levothyroxine 50 MCG tablet Commonly known as: SYNTHROID Take 50 mcg by mouth daily before breakfast.   linaclotide 290 MCG Caps capsule Commonly known as: LINZESS Take 290 mcg by mouth daily as needed (constipation).   metoprolol succinate 50 MG 24 hr tablet Commonly known as: TOPROL-XL Take 1 tablet (50 mg total) by mouth 2 (two) times daily. Take with or immediately following a meal. What changed:  how much to take when to take this additional instructions   nitroGLYCERIN 0.4 MG SL tablet Commonly known as: NITROSTAT PLACE ONE TABLET UNDER THE TONGUE EVERY 5 MINUTES FOR THREE DOSES AS NEEDED FOR CHEST PAIN What changed: See the new instructions.   pantoprazole 40 MG tablet Commonly known as: PROTONIX Take 40 mg by mouth at bedtime.   quinapril 20 MG tablet Commonly known as: ACCUPRIL Take 20 mg by mouth 2 (two) times daily.   rosuvastatin 40 MG tablet Commonly known as: CRESTOR Take 1 tablet (40 mg total) by mouth daily at 6 PM.   sertraline 100 MG tablet Commonly known as: ZOLOFT Take 100 mg by mouth daily.   vitamin C 500 MG tablet Commonly known as: ASCORBIC ACID Take 500 mg by mouth daily.           Outstanding Labs/Studies     Duration of Discharge Encounter   Greater than 30 minutes including physician time.  Signed, Margie Billet, PA-C 06/14/2021, 10:57 AM  Patient seen and examined with KJ PA.  Agree as above, with the following exceptions and changes as noted below.  Patient feels well today and appears brighter than yesterday, eager to go home.  We discussed briefly findings on cardiac catheterization as well as echocardiogram.  By exam her aortic valve stenosis is moderate to moderate severe, on echo dimensionless index was 0.23, on cath it was assessed as mild.  I have suggested we  should recheck  an echocardiogram in approximately 6 months or sooner for worsening symptoms.  We could also consider a noncontrast CT for aortic valve scoring.  I will leave this to the discretion of her primary cardiologist Dr. Fletcher Anon gen: NAD, CV: RRR, 3/6 late peaking systolic ejection murmur with preserved S2.  Lungs: clear, Abd: soft, Extrem: Warm, well perfused, no edema, Neuro/Psych: alert and oriented x 3, normal mood and affect. All available labs, radiology testing, previous records reviewed.  Feeling well today and ambulated without difficulty.  We added low-dose Imdur which I hope she will tolerate well for management of angina with complex coronary disease.  Also vascular disease, followed closely by Dr. Fletcher Anon.  Stable for hospital discharge today, patient and daughter are in agreement.  Elouise Munroe, MD 06/14/21 11:06 AM

## 2021-06-22 DIAGNOSIS — D649 Anemia, unspecified: Secondary | ICD-10-CM | POA: Diagnosis not present

## 2021-06-22 DIAGNOSIS — I739 Peripheral vascular disease, unspecified: Secondary | ICD-10-CM | POA: Diagnosis not present

## 2021-06-22 DIAGNOSIS — I350 Nonrheumatic aortic (valve) stenosis: Secondary | ICD-10-CM | POA: Diagnosis not present

## 2021-06-22 DIAGNOSIS — I2510 Atherosclerotic heart disease of native coronary artery without angina pectoris: Secondary | ICD-10-CM | POA: Diagnosis not present

## 2021-06-22 DIAGNOSIS — R0602 Shortness of breath: Secondary | ICD-10-CM | POA: Diagnosis not present

## 2021-06-22 DIAGNOSIS — Z09 Encounter for follow-up examination after completed treatment for conditions other than malignant neoplasm: Secondary | ICD-10-CM | POA: Diagnosis not present

## 2021-06-22 DIAGNOSIS — I10 Essential (primary) hypertension: Secondary | ICD-10-CM | POA: Diagnosis not present

## 2021-06-28 ENCOUNTER — Ambulatory Visit: Payer: Medicare HMO | Admitting: Medical

## 2021-06-28 ENCOUNTER — Encounter: Payer: Self-pay | Admitting: Medical

## 2021-06-28 VITALS — BP 116/60 | HR 67 | Ht 60.0 in | Wt 116.8 lb

## 2021-06-28 DIAGNOSIS — I25118 Atherosclerotic heart disease of native coronary artery with other forms of angina pectoris: Secondary | ICD-10-CM

## 2021-06-28 DIAGNOSIS — I739 Peripheral vascular disease, unspecified: Secondary | ICD-10-CM

## 2021-06-28 DIAGNOSIS — I10 Essential (primary) hypertension: Secondary | ICD-10-CM

## 2021-06-28 DIAGNOSIS — I350 Nonrheumatic aortic (valve) stenosis: Secondary | ICD-10-CM | POA: Diagnosis not present

## 2021-06-28 DIAGNOSIS — I779 Disorder of arteries and arterioles, unspecified: Secondary | ICD-10-CM

## 2021-06-28 MED ORDER — NITROGLYCERIN 0.4 MG SL SUBL: 0.4000 mg | SUBLINGUAL_TABLET | SUBLINGUAL | 3 refills | Status: DC | PRN

## 2021-06-28 MED ORDER — NITROGLYCERIN 0.4 MG SL SUBL: 0.4000 mg | SUBLINGUAL_TABLET | SUBLINGUAL | 3 refills | Status: AC | PRN

## 2021-06-28 NOTE — Progress Notes (Addendum)
Cardiology Office Note:    Date:  09/29/2021   ID:  Dawn Boyer, DOB 1945/06/14, MRN [id number]  PCP:  [name], MD  [facility] Cardiologist:  [name], MD  [facility] Electrophysiologist:  None   Referring MD: [name], MD   Chief Complaint: cardiac cath follow-up   History of Present Illness:    Dawn Boyer is a 76 y.o. female with a hx of left subclavian artery stenosis, PAD, CABG s/p CABG in 1994, HTN, moderate left carotid stenosis, hypothyroidism, HLD, and remote tobacco use.   She had worsening angina in 2019. She underwent cardiac catheterization which showed severe underlying three-vessel coronary artery disease with patent LIMA to LAD, SVG to diagonal and SVG to left PDA.  There was severe stenosis in SVG to diagonal which was treated successfully with drug-eluting stent placement. She was found to have severe heavily calcified stenosis in the left subclavian artery likely compromising the flow into the LIMA.  The patient had left subclavian artery stenting in July of 2019.   She was subsequently found to have peripheral arterial disease with severe right leg claudication. Angiography performed 10/03/2017 showed heavily calcified abdominal aorta with moderate stenosis distally, significant right renal artery stenosis, significant right common iliac artery stenosis at the ostium with moderate disease distally and moderate left iliac artery disease.  She underwent successful stent placement to the ostial right common iliac artery which extended 2 mm into the distal aorta.  She was admitted 06/11/21 for shortness of breath. HS trop was mildly elevated. EKG showed deeper TWIs. She underwent R/L heart cath that showed occluded SVG to diagonal, L>L collaterals recommending medical therapy. Echo showed mean gradient measures 29.0 mmHg. Aortic valve peak gradient measures 47.6 mmHg. Aortic valve area, by VTI measures  0.73 cm. Heart cath showed mild AS pressure  gradient, although calculated valve area 0.95 cm . She was continued on ASA and plavix.   Today, the cardiac cath was reviewed in detail. Lots of questions regarding prior cath in 2019 as well as recent cath. Also discussed PVD ultrasounds and echocardiogram.   She has been doing ok at home. She still has intermittent shortness of breath. This occurs when she does something. It can occur at night as well. She denies chest pain, says she has never had chest pain. Cath site healed well. No LLE, orthopnea, pnd.    Past Medical History:  Diagnosis Date   Anemia    hx   Anxiety    Arthritis    Carotid arterial disease (HCC)    a. 04/2015 Carotid U/S: RICA 1-39%, LICA 40-59%, no change.   Coronary artery disease    a. 1994 s/p CABG;  b. 03/2012 MV: No ischemia/infarct. DES SVG-Diag 2019   Depression    Diverticulosis    Elevated LFTs    Fibrocystic breast    GERD (gastroesophageal reflux disease)    Hemorrhoid    Hemorrhoids    History of hemolysis, elevated liver enzymes, and low platelet (HELLP) syndrome, currently pregnant    patient denies history   Hyperlipidemia    Hypertension    Hypertensive heart disease    Hypothyroidism    Insomnia    Nephrolithiasis    PONV (postoperative nausea and vomiting)    after heart surgery    Past Surgical History:  Procedure Laterality Date   ABDOMINAL AORTOGRAM W/LOWER EXTREMITY N/A 10/03/2017   Procedure: ABDOMINAL AORTOGRAM W/LOWER EXTREMITY;  Surgeon: Iran Ouch, MD;  Location: [facility] INVASIVE  CV LAB;  Service: Cardiovascular;  Laterality: N/A;  Bilateral Limited   AORTIC ARCH ANGIOGRAPHY N/A 07/25/2017   Procedure: AORTIC ARCH ANGIOGRAPHY;  Surgeon: Iran Ouch, MD;  Location: [facility] INVASIVE CV LAB;  Service: Cardiovascular;  Laterality: N/A;   CARDIAC CATHETERIZATION     CHOLECYSTECTOMY  2002   CORONARY ARTERY BYPASS GRAFT  1993   CORONARY STENT INTERVENTION N/A 04/03/2017   Procedure: CORONARY STENT INTERVENTION;  Surgeon:  Corky Crafts, MD;  Location: [facility] INVASIVE CV LAB;  Service: Cardiovascular;  Laterality: N/A;   EVALUATION UNDER ANESTHESIA WITH HEMORRHOIDECTOMY N/A 03/14/2012   Procedure: EXAM UNDER ANESTHESIA WITH Excisional HEMORRHOIDECTOMY and Hemorrhoidal banding;  Surgeon: Atilano Ina, MD;  Location[facility] OR;  Service: General;  Laterality: N/A;  x 2 hemorrhoids   HEMORRHOID SURGERY     LEFT HEART CATH AND CORS/GRAFTS ANGIOGRAPHY N/A 04/03/2017   Procedure: LEFT HEART CATH AND CORS/GRAFTS ANGIOGRAPHY;  Surgeon: Corky Crafts, MD;  Location: [facility] INVASIVE CV LAB;  Service: Cardiovascular;  Laterality: N/A;   PERIPHERAL VASCULAR INTERVENTION Left 07/25/2017   Procedure: PERIPHERAL VASCULAR INTERVENTION;  Surgeon: Iran Ouch, MD;  Location: [facility] INVASIVE CV LAB;  Service: Cardiovascular;  Laterality: Left;  Subclavian   PERIPHERAL VASCULAR INTERVENTION  10/03/2017   Procedure: PERIPHERAL VASCULAR INTERVENTION;  Surgeon: Iran Ouch, MD;  Location: [facility] INVASIVE CV LAB;  Service: Cardiovascular;;  LCIA   RIGHT/LEFT HEART CATH AND CORONARY/GRAFT ANGIOGRAPHY N/A 06/13/2021   Procedure: RIGHT/LEFT HEART CATH AND CORONARY/GRAFT ANGIOGRAPHY;  Surgeon: Corky Crafts, MD;  Location: [facility] INVASIVE CV LAB;  Service: Cardiovascular;  Laterality: N/A;   TUBAL LIGATION  1970   ULTRASOUND GUIDANCE FOR VASCULAR ACCESS  04/03/2017   Procedure: Ultrasound Guidance For Vascular Access;  Surgeon: Corky Crafts, MD;  Location: [facility] INVASIVE CV LAB;  Service: Cardiovascular;;   UPPER EXTREMITY ANGIOGRAPHY Left 07/25/2017   Procedure: UPPER EXTREMITY ANGIOGRAPHY;  Surgeon: Iran Ouch, MD;  Location: [facility] INVASIVE CV LAB;  Service: Cardiovascular;  Laterality: Left;    Current Medications: Current Meds  Medication Sig   ALPRAZolam (XANAX) 1 MG tablet Take 1 mg by mouth at bedtime.    amLODipine (NORVASC) 5 MG tablet Take 1 tablet (5 mg total) by mouth daily.   aspirin EC 81 MG tablet Take 1 tablet  (81 mg total) by mouth daily. Swallow whole.   cholecalciferol (VITAMIN D3) 25 MCG (1000 UNIT) tablet Take 1,000 Units by mouth daily. W Vit K ( K2 and D3)   enalapril (VASOTEC) 10 MG tablet Take by mouth.   isosorbide mononitrate (IMDUR) 30 MG 24 hr tablet Take 1/2 tablet (15 mg total) by mouth daily.   levothyroxine (SYNTHROID, LEVOTHROID) 50 MCG tablet Take 50 mcg by mouth daily before breakfast.    linaclotide (LINZESS) 290 MCG CAPS capsule Take 290 mcg by mouth daily as needed (constipation).   metoprolol succinate (TOPROL-XL) 50 MG 24 hr tablet Take 1 tablet (50 mg total) by mouth 2 (two) times daily. Take with or immediately following a meal.   pantoprazole (PROTONIX) 40 MG tablet Take 40 mg by mouth at bedtime.   rosuvastatin (CRESTOR) 40 MG tablet Take 1 tablet (40 mg total) by mouth daily at 6 PM.   sertraline (ZOLOFT) 100 MG tablet Take 100 mg by mouth daily.    Sodium Phosphates (ENEMA RE) Place 1 each rectally 2 (two) times daily. May use an additional 2 times as needed for constipation   vitamin C (ASCORBIC ACID) 500 MG  tablet Take 500 mg by mouth daily.   [DISCONTINUED] clopidogrel (PLAVIX) 75 MG tablet TAKE 1 TABLET EVERY DAY (Patient taking differently: Take 75 mg by mouth daily.)   [DISCONTINUED] ezetimibe (ZETIA) 10 MG tablet TAKE 1 TABLET EVERY DAY (Patient taking differently: Take 10 mg by mouth daily.)   [DISCONTINUED] nitroGLYCERIN (NITROSTAT) 0.4 MG SL tablet PLACE ONE TABLET UNDER THE TONGUE EVERY 5 MINUTES FOR THREE DOSES AS NEEDED FOR CHEST PAIN (Patient taking differently: Place 0.4 mg under the tongue every 5 (five) minutes as needed for chest pain.)     Allergies:   Patient has no known allergies.   Social History   Socioeconomic History   Marital status: Married    Spouse name: Not on file   Number of children: Not on file   Years of education: Not on file   Highest education level: Not on file  Occupational History   Not on file  Tobacco Use   Smoking  status: Former    Packs/day: 1.00    Years: 12.00    Total pack years: 12.00    Types: Cigarettes    Quit date: 11/21/1982    Years since quitting: 38.8   Smokeless tobacco: Never  Vaping Use   Vaping Use: Never used  Substance and Sexual Activity   Alcohol use: No    Alcohol/week: 0.0 standard drinks of alcohol   Drug use: No   Sexual activity: Not on file  Other Topics Concern   Not on file  Social History Narrative   Not on file   Social Determinants of Health   Financial Resource Strain: Not on file  Food Insecurity: Not on file  Transportation Needs: Not on file  Physical Activity: Not on file  Stress: Not on file  Social Connections: Not on file     Family History: The patient's family history includes Diabetes in her daughter; Heart Problems in her son; Heart disease in her brother; Hyperlipidemia in her sister; Hypertension in her sister; Stroke in her sister. There is no history of Colon cancer, Esophageal cancer, Rectal cancer, or Stomach cancer.  ROS:   Please see the history of present illness.     All other systems reviewed and are negative.  EKGs/Labs/Other Studies Reviewed:    The following studies were reviewed today:  Echocardiogram 06/12/21   1. Calcified aortic valve with restricted motion; severe AS (mean  gradient 29 mmHg; AVA 0.8 cm2; DI 0.23).   2. Left ventricular ejection fraction, by estimation, is 60 to 65%. The  left ventricle has normal function. The left ventricle has no regional  wall motion abnormalities. The left ventricular internal cavity size was  mildly dilated. There is mild  concentric left ventricular hypertrophy. Left ventricular diastolic  parameters are consistent with Grade I diastolic dysfunction (impaired  relaxation). Elevated left ventricular end-diastolic pressure.   3. Right ventricular systolic function is normal. The right ventricular  size is normal.   4. Left atrial size was severely dilated.   5. The mitral  valve is normal in structure. Mild mitral valve  regurgitation. No evidence of mitral stenosis.   6. The aortic valve is tricuspid. Aortic valve regurgitation is not  visualized. Severe aortic valve stenosis.   7. The inferior vena cava is normal in size with greater than 50%  respiratory variability, suggesting right atrial pressure of 3 mmHg.   Comparison(s): No prior Echocardiogram.    Right/Left Heart Cath 06/13/21   Ost LM to Mid LM lesion is  100% stenosed-known from prior cath.  LIMA to LAD is widely patent.   Ost Cx to Prox Cx lesion is 100% stenosed-known from prior cath.  SVG to OM is widely patent.   Ost 1st Diag lesion is 100% stenosed. SVG to diagonal is occluded.  Left to left collaterals fill the distal diagonal.   Widely patent left subclavian stent.  No pressure gradient noted.  There was difficulty in torquing catheters due to the stent placement.   LV end diastolic pressure is mildly elevated.   There is mild aortic valve stenosis.   Aortic saturation 95%, PA saturation 64%, mean PA pressure 10 mmHg, mean pulmonary capillary wedge pressure 3 mmHg, cardiac output 4.38 L/min, cardiac index 2.95.   Occluded SVG to diagonal.  Left to left collaterals fill this relatively small vessel.  Continue medical therapy.  Mild aortic stenosis by pressure gradient, although calculated valve area 0.95 cm.  There was difficulty in advancing the catheter across the valve; however, this could have been due to the proximal left subclavian stent which made catheter manipulation difficult.   If emergent cardiac cath was needed, would consider groin approach. Diagnostic Dominance: Left  _____________  EKG:  EKG is not ordered today.    Recent Labs: 06/11/2021: ALT 16; B Natriuretic Peptide 512.2; BUN 12; Creatinine, Ser 0.74; TSH 1.213 06/13/2021: Hemoglobin 10.5; Hemoglobin 10.9; Platelets 126; Potassium 3.9; Potassium 3.9; Sodium 142; Sodium 142  Recent Lipid Panel    Component Value  Date/Time   CHOL 150 06/11/2021 2323   CHOL 239 (H) 02/01/2016 1351   TRIG 86 06/11/2021 2323   HDL 54 06/11/2021 2323   HDL 47 02/01/2016 1351   CHOLHDL 2.8 06/11/2021 2323   VLDL 17 06/11/2021 2323   LDLCALC 79 06/11/2021 2323   LDLCALC 143 (H) 02/01/2016 1351   LDLDIRECT 90.8 01/08/2014 0828    Physical Exam:    VS:  BP 116/60   Pulse 67   Ht 5' (1.524 m)   Wt 116 lb 12.8 oz (53 kg)   SpO2 96%   BMI 22.81 kg/m     Wt Readings from Last 3 Encounters:  06/28/21 116 lb 12.8 oz (53 kg)  06/14/21 116 lb 6.4 oz (52.8 kg)  05/06/20 120 lb (54.4 kg)     GEN:  Well nourished, well developed in no acute distress HEENT: Normal NECK: No JVD; No carotid bruits LYMPHATICS: No lymphadenopathy CARDIAC: RRR, + murmur, no rubs, gallops RESPIRATORY:  Clear to auscultation without rales, wheezing or rhonchi  ABDOMEN: Soft, non-tender, non-distended MUSCULOSKELETAL:  No edema; No deformity  SKIN: Warm and dry NEUROLOGIC:  Alert and oriented x 3 PSYCHIATRIC:  Normal affect   ASSESSMENT:    1. Coronary artery disease involving native coronary artery of native heart with other form of angina pectoris (HCC)   2. Left-sided carotid artery disease, unspecified type (HCC)   3. Essential hypertension   4. PAD (peripheral artery disease) (HCC)   5. Aortic valve stenosis, etiology of cardiac valve disease unspecified    PLAN:    In order of problems listed above:  CAD s/p CABG in 1994 PCI SVG-diag 2019 Patient with recent admission for NSTEMI. Echo showed normal LVEF with severe AS. Cardiac cath showed patent LIMA to LAD, SVG to OM, with occluded SVG to diag with Left>left collaterals. No intervention was performed. There was difficult advancing the catheter across the valve, however it could have been due to the proximal left subclavian stent. The patient has been doing  well at home. She denies any chest pain (said she's never had chest pain), she endorses persistent occasional shortness  of breath. Cath site has healed well. She denies bleeding with DAPT. I will refer to cardiac rehab. Continue Aspirin and Plavix indefinitely. Continue Imdur 15mg  daily, Toprol-XL 50mg  daily, Crestor 40mg  daily and Zetia. We will assess her shortness of breath at follow-up.   S/p stent Left subclavian artery 2019 Open stent on recent cath. ,Continue secondary prevention.  HTN BP good, continue amlodipine 5mg  daily, enalapril 10mg  daily, and Toprol XL 50mg  daily.   Mild to moderate AS Initial echo showed severe AS, however LHC showed mild AS Aortic valve mean gradient measures 29.0 mmHg. Aortic valve peak gradient measures 47.6 mmHg. Aortic valve area, by VTI measures  0.73 cm. This may be contributing to shortness of breath. We will reassess at follow-up. If she is still short of breath, can consider TEE. IF breathing is better can monitor echo every 6 months.   PAD Moderate Left carotid stenosis Stable on 2022. Can re-check 2020 at follow-up  Disposition: Follow up in 3 month(s) with MD/APP      Signed, Montrice Gracey , PA-C  09/29/2021 12:32 PM    [facility]

## 2021-06-29 ENCOUNTER — Other Ambulatory Visit: Payer: Self-pay | Admitting: *Deleted

## 2021-06-29 DIAGNOSIS — I208 Other forms of angina pectoris: Secondary | ICD-10-CM

## 2021-07-07 NOTE — Progress Notes (Signed)
Iran Ouch, MD  Iona Coach, RN Ok thanks Ella Guillotte.        Previous Messages    ----- Message -----  From: Iona Coach, RN  Sent: 07/06/2021   4:06 PM EDT  To: Iona Coach, RN; Iran Ouch, MD; *   Hey Dr Kirke Corin,   I contacted the pt and made her aware that you reviewed her testing and recommended TAVR eval.  At this time the pt did not want to schedule an appointment.  I advised her that the AS can cause SOB and she denied having this symptom.  I made her aware that she can contact me to arrange evaluation any time but she needs to keep September OV as scheduled.   Thanks,  Stpehanie Montroy  ----- Message -----  From: Iran Ouch, MD  Sent: 07/05/2021  12:56 PM EDT  To: Iona Coach, RN; Cadence David Stall, PA-C   Cadence,  I reviewed her echocardiogram and I think she likely has low gradient severe aortic stenosis.  This most likely is causing her symptoms.  I recommend referral for TAVR evaluation.  Please let the patient know that I reviewed her studies and this is what I recommend.   Dawana Asper,  Can you please get this patient in for TAVR evaluation?  Thanks.    ----- Message -----  From: Marianne Sofia, PA-C  Sent: 06/28/2021   4:16 PM EDT  To: Iran Ouch, MD   F/u hospital NSTEMI/AS. No intervention. Still has some SOB. Refer to rehab and address valve at f/u.

## 2021-07-12 DIAGNOSIS — D649 Anemia, unspecified: Secondary | ICD-10-CM | POA: Diagnosis not present

## 2021-07-12 DIAGNOSIS — I10 Essential (primary) hypertension: Secondary | ICD-10-CM | POA: Diagnosis not present

## 2021-08-10 ENCOUNTER — Telehealth: Payer: Self-pay | Admitting: Cardiovascular Disease

## 2021-08-10 NOTE — Telephone Encounter (Signed)
Pt states she is returning a call from "Carla". Did not see a phone note and I'm not familiar with that name. Please advise.

## 2021-08-10 NOTE — Telephone Encounter (Signed)
Spoke with patient and advised that we do not have anyone by that name here in our office. She provided number. Called number and it was for cardiopulmonary rehab and that her name is Dawn Boyer. Reviewed that their number is the one she provided. She inquired if it was in regards to exercise because she does not want to do that. Advised she can discuss that with her when she calls her back. She verbalized understanding with no further questions at this time.

## 2021-08-15 DIAGNOSIS — D649 Anemia, unspecified: Secondary | ICD-10-CM | POA: Diagnosis not present

## 2021-08-30 ENCOUNTER — Other Ambulatory Visit: Payer: Self-pay

## 2021-08-30 MED ORDER — EZETIMIBE 10 MG PO TABS: 10.0000 mg | ORAL_TABLET | Freq: Every day | ORAL | 0 refills | Status: DC

## 2021-08-30 MED ORDER — CLOPIDOGREL BISULFATE 75 MG PO TABS: 75.0000 mg | ORAL_TABLET | Freq: Every day | ORAL | 0 refills | Status: DC

## 2021-08-30 NOTE — Telephone Encounter (Signed)
Received a voicemail from [employer] requesting a refill on Plavix and Zetia for the patient.

## 2021-09-29 ENCOUNTER — Ambulatory Visit: Payer: Medicare HMO | Attending: Medical | Admitting: Medical

## 2021-09-29 ENCOUNTER — Encounter: Payer: Self-pay | Admitting: Medical

## 2021-09-29 VITALS — BP 104/60 | HR 63 | Ht 60.0 in | Wt 118.2 lb

## 2021-09-29 DIAGNOSIS — I779 Disorder of arteries and arterioles, unspecified: Secondary | ICD-10-CM

## 2021-09-29 DIAGNOSIS — I25118 Atherosclerotic heart disease of native coronary artery with other forms of angina pectoris: Secondary | ICD-10-CM

## 2021-09-29 NOTE — Patient Instructions (Signed)
Medication Instructions:   Your physician recommends that you continue on your current medications as directed. Please refer to the Current Medication list given to you today.  *If you need a refill on your cardiac medications before your next appointment, please call your pharmacy*   Lab Work:  None Ordered  If you have labs (blood work) drawn today and your tests are completely normal, you will receive your results only by: [place] (if you have MyChart) OR A paper copy in the mail If you have any lab test that is abnormal or we need to change your treatment, we will call you to review the results.   Testing/Procedures:  None Ordered   Follow-Up: At [facility], you and your health needs are our priority.  As part of our continuing mission to provide you with exceptional heart care, we have created designated Provider Care Teams.  These Care Teams include your primary Cardiologist (physician) and Advanced Practice Providers (APPs -  Physician Assistants and Nurse Practitioners) who all work together to provide you with the care you need, when you need it.  We recommend signing up for the patient portal called "MyChart".  Sign up information is provided on this After Visit Summary.  MyChart is used to connect with patients for Virtual Visits (Telemedicine).  Patients are able to view lab/test results, encounter notes, upcoming appointments, etc.  Non-urgent messages can be sent to your provider as well.   To learn more about what you can do with MyChart, go to [email or web address].    Your next appointment:   3 month(s)  The format for your next appointment:   In Person  Provider:   You may see Kathlyn Sacramento, MD or one of the following Advanced Practice Providers on your designated Care Team:   Murray Hodgkins, NP Christell Faith, PA-C Cadence Kathlen Mody, PA-C Gerrie Nordmann, NP     Important Information About Sugar

## 2021-09-29 NOTE — Progress Notes (Signed)
Cardiology Office Note:    Date:  09/29/2021   ID:  Dawn Boyer, DOB [birth year], MRN [id number]  PCP:  [name], MD  [facility] Cardiologist:  [name], MD  [facility] Electrophysiologist:  None   Referring MD: [name], MD   Chief Complaint: 3 month follow-up  History of Present Illness:    Dawn Boyer is a 76 y.o. female with a hx of  left subclavian artery stenosis, PAD, CABG s/p CABG in 1994, HTN, moderate left carotid stenosis, hypothyroidism, HLD, remote tobacco use.   She had worsening angina in 2019. She underwent cardiac catheterization which showed severe underlying three-vessel coronary artery disease with patent LIMA to LAD, SVG to diagonal and SVG to left PDA.  There was severe stenosis in SVG to diagonal which was treated successfully with drug-eluting stent placement. She was found to have severe heavily calcified stenosis in the left subclavian artery likely compromising the flow into the LIMA.  The patient had left subclavian artery stenting in July of 2019.    She was subsequently found to have peripheral arterial disease with severe right leg claudication. Angiography performed 10/03/2017 showed heavily calcified abdominal aorta with moderate stenosis distally, significant right renal artery stenosis, significant right common iliac artery stenosis at the ostium with moderate disease distally and moderate left iliac artery disease.  She underwent successful stent placement to the ostial right common iliac artery which extended 2 mm into the distal aorta.   She was admitted 06/11/21 for shortness of breath. HS trop was mildly elevated. EKG showed deeper TWIs. She underwent R/L heart cath that showed occluded SVG to diagonal, L>L collaterals recommending medical therapy. Echo showed mean gradient measures 29.0 mmHg. Aortic valve peak gradient measures 47.6 mmHg. Aortic valve area, by VTI measures  0.73 cm. Heart cath showed mild AS pressure gradient,  although calculated valve area 0.95 cm . She was continued on ASA and plavix.   Last seen 06/28/21 and was overall doing OK. She was referred to cardiac rehab.   Today, the patient reports she is overall doing well. She did not do cardiac rehab. She  stays fairly active, but does not formal exercise. She denies chest pain. She has some dyspnea with exertion with walking or housework. No lower leg edema, orthopnea or pnd.    Past Medical History:  Diagnosis Date   Anemia    hx   Anxiety    Arthritis    Carotid arterial disease (HCC)    a. 04/2015 Carotid U/S: RICA 1-39%, LICA 40-59%, no change.   Coronary artery disease    a. 1994 s/p CABG;  b. 03/2012 MV: No ischemia/infarct. DES SVG-Diag 2019   Depression    Diverticulosis    Elevated LFTs    Fibrocystic breast    GERD (gastroesophageal reflux disease)    Hemorrhoid    Hemorrhoids    History of hemolysis, elevated liver enzymes, and low platelet (HELLP) syndrome, currently pregnant    patient denies history   Hyperlipidemia    Hypertension    Hypertensive heart disease    Hypothyroidism    Insomnia    Nephrolithiasis    PONV (postoperative nausea and vomiting)    after heart surgery    Past Surgical History:  Procedure Laterality Date   ABDOMINAL AORTOGRAM W/LOWER EXTREMITY N/A 10/03/2017   Procedure: ABDOMINAL AORTOGRAM W/LOWER EXTREMITY;  Surgeon: Iran Ouch, MD;  Location: [facility];  Service: Cardiovascular;  Laterality: N/A;  Bilateral Limited  AORTIC ARCH ANGIOGRAPHY N/A 07/25/2017   Procedure: AORTIC ARCH ANGIOGRAPHY;  Surgeon: Wellington Hampshire, MD;  Location: [place] [facility];  Service: Cardiovascular;  Laterality: N/A;   CARDIAC CATHETERIZATION     CHOLECYSTECTOMY  2002   CORONARY ARTERY BYPASS GRAFT  1993   CORONARY STENT INTERVENTION N/A 04/03/2017   Procedure: CORONARY STENT INTERVENTION;  Surgeon: Jettie Booze, MD;  Location: [place] [facility];  Service: Cardiovascular;  Laterality:  N/A;   EVALUATION UNDER ANESTHESIA WITH HEMORRHOIDECTOMY N/A 03/14/2012   Procedure: EXAM UNDER ANESTHESIA WITH Excisional HEMORRHOIDECTOMY and Hemorrhoidal banding;  Surgeon: Gayland Curry, MD;  Location: [place];  Service: General;  Laterality: N/A;  x 2 hemorrhoids   HEMORRHOID SURGERY     LEFT HEART CATH AND CORS/GRAFTS ANGIOGRAPHY N/A 04/03/2017   Procedure: LEFT HEART CATH AND CORS/GRAFTS ANGIOGRAPHY;  Surgeon: Jettie Booze, MD;  Location: [place] [facility];  Service: Cardiovascular;  Laterality: N/A;   PERIPHERAL VASCULAR INTERVENTION Left 07/25/2017   Procedure: PERIPHERAL VASCULAR INTERVENTION;  Surgeon: Wellington Hampshire, MD;  Location: [place] [facility];  Service: Cardiovascular;  Laterality: Left;  Subclavian   PERIPHERAL VASCULAR INTERVENTION  10/03/2017   Procedure: PERIPHERAL VASCULAR INTERVENTION;  Surgeon: Wellington Hampshire, MD;  Location: [place] [facility];  Service: Cardiovascular;;  LCIA   RIGHT/LEFT HEART CATH AND CORONARY/GRAFT ANGIOGRAPHY N/A 06/13/2021   Procedure: RIGHT/LEFT HEART CATH AND CORONARY/GRAFT ANGIOGRAPHY;  Surgeon: Jettie Booze, MD;  Location: [place] [facility];  Service: Cardiovascular;  Laterality: N/A;   TUBAL LIGATION  1970   ULTRASOUND GUIDANCE FOR VASCULAR ACCESS  04/03/2017   Procedure: Ultrasound Guidance For Vascular Access;  Surgeon: Jettie Booze, MD;  Location: [place] [facility];  Service: Cardiovascular;;   UPPER EXTREMITY ANGIOGRAPHY Left 07/25/2017   Procedure: UPPER EXTREMITY ANGIOGRAPHY;  Surgeon: Wellington Hampshire, MD;  Location: [place] [facility];  Service: Cardiovascular;  Laterality: Left;    Current Medications: Current Meds  Medication Sig   ALPRAZolam (XANAX) 1 MG tablet Take 1 mg by mouth at bedtime.    amLODipine (NORVASC) 5 MG tablet Take 1 tablet (5 mg total) by mouth daily.   aspirin EC 81 MG tablet Take 1 tablet (81 mg total) by mouth daily. Swallow whole.   cholecalciferol (VITAMIN D3) 25 MCG (1000 UNIT)  tablet Take 5,000 Units by mouth daily. W Vit K ( K2 and D3)   clopidogrel (PLAVIX) 75 MG tablet Take 1 tablet (75 mg total) by mouth daily.   co-enzyme Q-10 30 MG capsule Take 30 mg by mouth 2 (two) times daily.   enalapril (VASOTEC) 10 MG tablet Take by mouth.   ezetimibe (ZETIA) 10 MG tablet Take 1 tablet (10 mg total) by mouth daily.   isosorbide mononitrate (IMDUR) 30 MG 24 hr tablet Take 1/2 tablet (15 mg total) by mouth daily.   levothyroxine (SYNTHROID, LEVOTHROID) 50 MCG tablet Take 50 mcg by mouth daily before breakfast.    linaclotide (LINZESS) 290 MCG CAPS capsule Take 290 mcg by mouth daily as needed (constipation).   metoprolol succinate (TOPROL-XL) 50 MG 24 hr tablet Take 1 tablet (50 mg total) by mouth 2 (two) times daily. Take with or immediately following a meal.   nitroGLYCERIN (NITROSTAT) 0.4 MG SL tablet Place 1 tablet (0.4 mg total) under the tongue every 5 (five) minutes as needed for chest pain. PLACE ONE TABLET UNDER THE TONGUE EVERY 5 MINUTES FOR THREE DOSES AS NEEDED FOR CHEST PAIN  pantoprazole (PROTONIX) 40 MG tablet Take 40 mg by mouth at bedtime.   rosuvastatin (CRESTOR) 40 MG tablet Take 1 tablet (40 mg total) by mouth daily at 6 PM.   sertraline (ZOLOFT) 100 MG tablet Take 100 mg by mouth daily.    Sodium Phosphates (ENEMA RE) Place 1 each rectally 2 (two) times daily. May use an additional 2 times as needed for constipation   vitamin C (ASCORBIC ACID) 500 MG tablet Take 500 mg by mouth daily.     Allergies:   Patient has no known allergies.   Social History   Socioeconomic History   Marital status: Married    Spouse name: Not on file   Number of children: Not on file   Years of education: Not on file   Highest education level: Not on file  Occupational History   Not on file  Tobacco Use   Smoking status: Former    Packs/day: 1.00    Years: 12.00    Total pack years: 12.00    Types: Cigarettes    Quit date: 11/21/1982    Years since quitting: 38.8    Smokeless tobacco: Never  Vaping Use   Vaping Use: Never used  Substance and Sexual Activity   Alcohol use: No    Alcohol/week: 0.0 standard drinks of alcohol   Drug use: No   Sexual activity: Not on file  Other Topics Concern   Not on file  Social History Narrative   Not on file   Social Determinants of Health   Financial Resource Strain: Not on file  Food Insecurity: Not on file  Transportation Needs: Not on file  Physical Activity: Not on file  Stress: Not on file  Social Connections: Not on file     Family History: The patient's family history includes Diabetes in her daughter; Heart Problems in her son; Heart disease in her brother; Hyperlipidemia in her sister; Hypertension in her sister; Stroke in her sister. There is no history of Colon cancer, Esophageal cancer, Rectal cancer, or Stomach cancer.  ROS:   Please see the history of present illness.     All other systems reviewed and are negative.  EKGs/Labs/Other Studies Reviewed:    The following studies were reviewed today:    Echocardiogram 06/12/21   1. Calcified aortic valve with restricted motion; severe AS (mean  gradient 29 mmHg; AVA 0.8 cm2; DI 0.23).   2. Left ventricular ejection fraction, by estimation, is 60 to 65%. The  left ventricle has normal function. The left ventricle has no regional  wall motion abnormalities. The left ventricular internal cavity size was  mildly dilated. There is mild  concentric left ventricular hypertrophy. Left ventricular diastolic  parameters are consistent with Grade I diastolic dysfunction (impaired  relaxation). Elevated left ventricular end-diastolic pressure.   3. Right ventricular systolic function is normal. The right ventricular  size is normal.   4. Left atrial size was severely dilated.   5. The mitral valve is normal in structure. Mild mitral valve  regurgitation. No evidence of mitral stenosis.   6. The aortic valve is tricuspid. Aortic valve  regurgitation is not  visualized. Severe aortic valve stenosis.   7. The inferior vena cava is normal in size with greater than 50%  respiratory variability, suggesting right atrial pressure of 3 mmHg.   Comparison(s): No prior Echocardiogram.    Right/Left Heart Cath 06/13/21   Ost LM to Mid LM lesion is 100% stenosed-known from prior cath.  LIMA to LAD  is widely patent.   Ost Cx to Prox Cx lesion is 100% stenosed-known from prior cath.  SVG to OM is widely patent.   Ost 1st Diag lesion is 100% stenosed. SVG to diagonal is occluded.  Left to left collaterals fill the distal diagonal.   Widely patent left subclavian stent.  No pressure gradient noted.  There was difficulty in torquing catheters due to the stent placement.   LV end diastolic pressure is mildly elevated.   There is mild aortic valve stenosis.   Aortic saturation 95%, PA saturation 64%, mean PA pressure 10 mmHg, mean pulmonary capillary wedge pressure 3 mmHg, cardiac output 4.38 L/min, cardiac index 2.95.   Occluded SVG to diagonal.  Left to left collaterals fill this relatively small vessel.  Continue medical therapy.  Mild aortic stenosis by pressure gradient, although calculated valve area 0.95 cm.  There was difficulty in advancing the catheter across the valve; however, this could have been due to the proximal left subclavian stent which made catheter manipulation difficult.   If emergent cardiac cath was needed, would consider groin approach. Diagnostic Dominance: Left   EKG:  EKG is ordered today.  The ekg ordered today demonstrates NSR 63bpm, nonspecific St/T wave abnormality, no changes  Recent Labs: 06/11/2021: ALT 16; B Natriuretic Peptide 512.2; BUN 12; Creatinine, Ser 0.74; TSH 1.213 06/13/2021: Hemoglobin 10.5; Hemoglobin 10.9; Platelets 126; Potassium 3.9; Potassium 3.9; Sodium 142; Sodium 142  Recent Lipid Panel    Component Value Date/Time   CHOL 150 06/11/2021 2323   CHOL 239 (H) 02/01/2016 1351   TRIG  86 06/11/2021 2323   HDL 54 06/11/2021 2323   HDL 47 02/01/2016 1351   CHOLHDL 2.8 06/11/2021 2323   VLDL 17 06/11/2021 2323   LDLCALC 79 06/11/2021 2323   LDLCALC 143 (H) 02/01/2016 1351   LDLDIRECT 90.8 01/08/2014 0828      Physical Exam:    VS:  BP 104/60 (BP Location: Left Arm, Patient Position: Sitting, Cuff Size: Normal)   Pulse 63   Ht 5' (1.524 m)   Wt 118 lb 3.2 oz (53.6 kg)   SpO2 98%   BMI 23.08 kg/m     Wt Readings from Last 3 Encounters:  09/29/21 118 lb 3.2 oz (53.6 kg)  06/28/21 116 lb 12.8 oz (53 kg)  06/14/21 116 lb 6.4 oz (52.8 kg)     GEN:  Well nourished, well developed in no acute distress HEENT: Normal NECK: No JVD; No carotid bruits LYMPHATICS: No lymphadenopathy CARDIAC: RRR, no murmurs, rubs, gallops RESPIRATORY:  Clear to auscultation without rales, wheezing or rhonchi  ABDOMEN: Soft, non-tender, non-distended MUSCULOSKELETAL:  No edema; No deformity  SKIN: Warm and dry NEUROLOGIC:  Alert and oriented x 3 PSYCHIATRIC:  Normal affect   ASSESSMENT:    1. Coronary artery disease involving native coronary artery of native heart with other form of angina pectoris (HCC)   2. Left-sided carotid artery disease, unspecified type (HCC)    PLAN:    In order of problems listed above:  CAD s/p CABG in 1994 and PCI SVG-diag 2019 Patient overall is doing well. She denies chest pain, but has DOE at times. She did not do cardiac rehab and does not formal activity. I recommended walking 3-4 times weekly. Continue DAPT with ASA and Plavix. Continue Toprol, Crestor and Zetia.   HTN BP is good today, continue current medications.   S/p stent left subclavian artery 2019 Patent stent by cath in 06/2021.   Aortic stenosis Heart  cath in 06/2021 showed mild AS with calculated valve area 0.95cm2. Obvious murmur on exam. We discussed referral to the structural heart team, although patient wants to wait at this time. Can repeat echo every 6-12 months.    PAD Moderate left carotid stenosis Left carotid US stable in 2022. Heavily calcified abdominal aorta with moderate stenosis distally by cath in 2019. Continue Aspirin, plavix and statin.   Disposition: Follow up in 4 month(s) with MD/APP    Signed, Kylina Vultaggio David StallH Ethyn Schetter, PA-C  09/29/2021 2:53 PM    [facility]

## 2021-11-06 ENCOUNTER — Other Ambulatory Visit: Payer: Self-pay | Admitting: Cardiovascular Disease

## 2021-11-09 ENCOUNTER — Other Ambulatory Visit: Payer: Self-pay | Admitting: Cardiovascular Disease

## 2021-12-28 DIAGNOSIS — E782 Mixed hyperlipidemia: Secondary | ICD-10-CM | POA: Diagnosis not present

## 2021-12-28 DIAGNOSIS — I2510 Atherosclerotic heart disease of native coronary artery without angina pectoris: Secondary | ICD-10-CM | POA: Diagnosis not present

## 2021-12-28 DIAGNOSIS — I10 Essential (primary) hypertension: Secondary | ICD-10-CM | POA: Diagnosis not present

## 2021-12-28 DIAGNOSIS — F411 Generalized anxiety disorder: Secondary | ICD-10-CM | POA: Diagnosis not present

## 2021-12-28 DIAGNOSIS — E039 Hypothyroidism, unspecified: Secondary | ICD-10-CM | POA: Diagnosis not present

## 2022-01-19 ENCOUNTER — Other Ambulatory Visit: Payer: Self-pay | Admitting: Cardiovascular Disease

## 2022-01-20 ENCOUNTER — Encounter: Payer: Self-pay | Admitting: Cardiovascular Disease

## 2022-01-20 ENCOUNTER — Ambulatory Visit: Payer: Medicare HMO | Attending: Cardiovascular Disease | Admitting: Cardiovascular Disease

## 2022-01-20 VITALS — BP 100/60 | HR 78 | Ht 60.0 in | Wt 118.1 lb

## 2022-01-20 DIAGNOSIS — I10 Essential (primary) hypertension: Secondary | ICD-10-CM

## 2022-01-20 DIAGNOSIS — I2510 Atherosclerotic heart disease of native coronary artery without angina pectoris: Secondary | ICD-10-CM | POA: Diagnosis not present

## 2022-01-20 DIAGNOSIS — I350 Nonrheumatic aortic (valve) stenosis: Secondary | ICD-10-CM

## 2022-01-20 DIAGNOSIS — E785 Hyperlipidemia, unspecified: Secondary | ICD-10-CM

## 2022-01-20 DIAGNOSIS — I739 Peripheral vascular disease, unspecified: Secondary | ICD-10-CM | POA: Diagnosis not present

## 2022-01-20 DIAGNOSIS — I6523 Occlusion and stenosis of bilateral carotid arteries: Secondary | ICD-10-CM | POA: Diagnosis not present

## 2022-01-20 DIAGNOSIS — I771 Stricture of artery: Secondary | ICD-10-CM | POA: Diagnosis not present

## 2022-01-20 MED ORDER — AMLODIPINE BESYLATE 5 MG PO TABS: 5.0000 mg | ORAL_TABLET | Freq: Every day | ORAL | 3 refills | Status: DC

## 2022-01-20 NOTE — Progress Notes (Signed)
Cardiology Office Note   Date:  01/20/2022   ID:  [name], DOB Aug 17, 1945, MRN [id number]  PCP:  Dawn Has, MD  Cardiologist:  Dr. Eldridge Dace, Dawn Bears, MD   Chief Complaint  Patient presents with   Other    3 month f/u no complaints today. Meds reviewed verbally with pt.      History of Present Illness: Dawn Boyer is a 77 y.o. female who is here today for a follow up visit regarding left subclavian artery stenosis and peripheral arterial disease. She Boyer known history of coronary artery disease status post CABG in 1994, essential hypertension,  left carotid stenosis, hypothyroidism and hyperlipidemia.  There is remote history of tobacco use. She had worsening angina in 2019. She underwent cardiac catheterization which showed severe underlying three-vessel coronary artery disease with patent LIMA to LAD, SVG to diagonal and SVG to left PDA.  There was severe stenosis in SVG to diagonal which was treated successfully with drug-eluting stent placement. She was found to have severe heavily calcified stenosis in the left subclavian artery likely compromising the flow into the LIMA.  The patient had left subclavian artery stenting in July of 2019.  She was subsequently found to have peripheral arterial disease with severe right leg claudication. Angiography performed 10/03/2017 showed heavily calcified abdominal aorta with moderate stenosis distally, significant right renal artery stenosis, significant right common iliac artery stenosis at the ostium with moderate disease distally and moderate left iliac artery disease.  I performed successful stent placement to the ostial right common iliac artery which extended 2 mm into the distal aorta.  She was hospitalized in June 2023 with unstable angina.  Right and left cardiac catheterization showed occluded SVG to diagonal with collaterals.  Medical therapy was recommended.  Echocardiogram showed moderate aortic stenosis with mean  gradient of 29 mmHg.  She reports no recurrent chest pain or shortness of breath.  She does complain of occasional exertional neck discomfort.  In addition, she complains of right leg pain with walking.  This does not happen all the time.   Past Medical History:  Diagnosis Date   Anemia    hx   Anxiety    Arthritis    Carotid arterial disease (HCC)    a. 04/2015 Carotid U/S: RICA 1-39%, LICA 40-59%, no change.   Coronary artery disease    a. 1994 s/p CABG;  b. 03/2012 MV: No ischemia/infarct. DES SVG-Diag 2019   Depression    Diverticulosis    Elevated LFTs    Fibrocystic breast    GERD (gastroesophageal reflux disease)    Hemorrhoid    Hemorrhoids    History of hemolysis, elevated liver enzymes, and low platelet (HELLP) syndrome, currently pregnant    patient denies history   Hyperlipidemia    Hypertension    Hypertensive heart disease    Hypothyroidism    Insomnia    Nephrolithiasis    PONV (postoperative nausea and vomiting)    after heart surgery    Past Surgical History:  Procedure Laterality Date   ABDOMINAL AORTOGRAM W/LOWER EXTREMITY N/A 10/03/2017   Procedure: ABDOMINAL AORTOGRAM W/LOWER EXTREMITY;  Surgeon: Iran Ouch, MD;  Location: [facility] IN[facility]ASIVE CV [facility];  Service: Cardiovascular;  Laterality: N/A;  Bilateral Limited   AORTIC ARCH ANGIOGRAPHY N/A 07/25/2017   Procedure: AORTIC ARCH ANGIOGRAPHY;  Surgeon: Iran Ouch, MD;  Location: [facility] CV [facility];  Service: Cardiovascular;  Laterality: N/A;   CARDIAC CATHETERIZATION     CHOLECYSTECTOMY  2002   CORONARY ARTERY BYPASS GRAFT  1993   CORONARY STENT INTERVENTION N/A 04/03/2017   Procedure: CORONARY STENT INTERVENTION;  Surgeon: Corky Crafts, MD;  Location: [facility] INVASIVE CV LAB;  Service: Cardiovascular;  Laterality: N/A;   EVALUATION UNDER ANESTHESIA WITH HEMORRHOIDECTOMY N/A 03/14/2012   Procedure: EXAM UNDER ANESTHESIA WITH Excisional HEMORRHOIDECTOMY and Hemorrhoidal banding;  Surgeon: Atilano Ina, MD;  Location[facility] OR;  Service: General;  Laterality: N/A;  x 2 hemorrhoids   HEMORRHOID SURGERY     LEFT HEART CATH AND CORS/GRAFTS ANGIOGRAPHY N/A 04/03/2017   Procedure: LEFT HEART CATH AND CORS/GRAFTS ANGIOGRAPHY;  Surgeon: Corky Crafts, MD;  Location: [facility];  Service: Cardiovascular;  Laterality: N/A;   PERIPHERAL VASCULAR INTERVENTION Left 07/25/2017   Procedure: PERIPHERAL VASCULAR INTERVENTION;  Surgeon: Iran Ouch, MD;  Location: [facility];  Service: Cardiovascular;  Laterality: Left;  Subclavian   PERIPHERAL VASCULAR INTERVENTION  10/03/2017   Procedure: PERIPHERAL VASCULAR INTERVENTION;  Surgeon: Iran Ouch, MD;  Location: [facility];  Service: Cardiovascular;;  LCIA   RIGHT/LEFT HEART CATH AND CORONARY/GRAFT ANGIOGRAPHY N/A 06/13/2021   Procedure: RIGHT/LEFT HEART CATH AND CORONARY/GRAFT ANGIOGRAPHY;  Surgeon: Corky Crafts, MD;  Location: [facility] INVASIVE CV LAB;  Service: Cardiovascular;  Laterality: N/A;   TUBAL LIGATION  1970   ULTRASOUND GUIDANCE FOR VASCULAR ACCESS  04/03/2017   Procedure: Ultrasound Guidance For Vascular Access;  Surgeon: Corky Crafts, MD;  Location: [facility] INVASIVE CV LAB;  Service: Cardiovascular;;   UPPER EXTREMITY ANGIOGRAPHY Left 07/25/2017   Procedure: UPPER EXTREMITY ANGIOGRAPHY;  Surgeon: Iran Ouch, MD;  Location: [facility] INVASIVE CV LAB;  Service: Cardiovascular;  Laterality: Left;     Current Outpatient Medications  Medication Sig Dispense Refill   ALPRAZolam (XANAX) 1 MG tablet Take 1 mg by mouth at bedtime.      amLODipine (NORVASC) 5 MG tablet Take 1 tablet (5 mg total) by mouth daily. 90 tablet 3   aspirin EC 81 MG tablet Take 1 tablet (81 mg total) by mouth daily. Swallow whole. 30 tablet 12   b complex vitamins capsule Take 1 capsule by mouth daily.     cholecalciferol (VITAMIN D3) 25 MCG (1000 UNIT) tablet Take 5,000 Units by mouth daily. W Vit K ( K2 and D3)     clopidogrel  (PLAVIX) 75 MG tablet TAKE 1 TABLET EVERY DAY 90 tablet 3   co-enzyme Q-10 30 MG capsule Take 30 mg by mouth 2 (two) times daily.     enalapril (VASOTEC) 10 MG tablet Take by mouth.     ezetimibe (ZETIA) 10 MG tablet TAKE 1 TABLET EVERY DAY 90 tablet 3   isosorbide mononitrate (IMDUR) 30 MG 24 hr tablet Take 1/2 tablet (15 mg total) by mouth daily. 90 tablet 3   levothyroxine (SYNTHROID, LEVOTHROID) 50 MCG tablet Take 50 mcg by mouth daily before breakfast.      linaclotide (LINZESS) 290 MCG CAPS capsule Take 290 mcg by mouth daily as needed (constipation).     metoprolol succinate (TOPROL-XL) 50 MG 24 hr tablet Take 1 tablet (50 mg total) by mouth 2 (two) times daily. Take with or immediately following a meal. 180 tablet 3   Multiple Vitamins-Minerals (ZINC PO) Take by mouth daily in the afternoon.     nitroGLYCERIN (NITROSTAT) 0.4 MG SL tablet Place 1 tablet (0.4 mg total) under the tongue every 5 (five) minutes as needed for chest pain. PLACE ONE TABLET UNDER  THE TONGUE EVERY 5 MINUTES FOR THREE DOSES AS NEEDED FOR CHEST PAIN 25 tablet 3   pantoprazole (PROTONIX) 40 MG tablet Take 40 mg by mouth at bedtime.     rosuvastatin (CRESTOR) 40 MG tablet Take 1 tablet (40 mg total) by mouth daily at 6 PM.     sertraline (ZOLOFT) 100 MG tablet Take 100 mg by mouth daily.      Sodium Phosphates (ENEMA RE) Place 1 each rectally 2 (two) times daily. May use an additional 2 times as needed for constipation     vitamin C (ASCORBIC ACID) 500 MG tablet Take 500 mg by mouth daily.     No current facility-administered medications for this visit.    Allergies:   Patient Boyer no known allergies.    Social History:  The patient  reports that she quit smoking about 39 years ago. Her smoking use included cigarettes. She Boyer a 12.00 pack-year smoking history. She Boyer never used smokeless tobacco. She reports that she does not drink alcohol and does not use drugs.   Family History:  The patient's family history  includes Diabetes in her daughter; Heart Problems in her son; Heart disease in her brother; Hyperlipidemia in her sister; Hypertension in her sister; Stroke in her sister.    ROS:  Please see the history of present illness.   Otherwise, review of systems are positive for none.   All other systems are reviewed and negative.    PHYSICAL EXAM: VS:  BP 100/60 (BP Location: Left Arm, Patient Position: Sitting, Cuff Size: Normal)   Pulse 78   Ht 5' (1.524 m)   Wt 118 lb 2 oz (53.6 kg)   SpO2 97%   BMI 23.07 kg/m  , BMI Body mass index is 23.07 kg/m. GEN: Well nourished, well developed, in no acute distress  HEENT: normal  Neck: no JVD.  Bilateral carotid bruits and a bruit in the left subclavian area. Cardiac: RRR; no  rubs, or gallops,no edema.  2 / 6 late peaking systolic murmur in the aortic area Respiratory:  clear to auscultation bilaterally, normal work of breathing GI: soft, nontender, nondistended, + BS MS: no deformity or atrophy  Skin: warm and dry, no rash Neuro:  Strength and sensation are intact Psych: euthymic mood, full affect Vascular: Distal pulses are normal on the left but diminished on the right.   EKG:  EKG is not ordered today.      Recent Labs: 06/11/2021: ALT 16; B Natriuretic Peptide 512.2; BUN 12; Creatinine, Ser 0.74; TSH 1.213 06/13/2021: Hemoglobin 10.5; Hemoglobin 10.9; Platelets 126; Potassium 3.9; Potassium 3.9; Sodium 142; Sodium 142    Lipid Panel    Component Value Date/Time   CHOL 150 06/11/2021 2323   CHOL 239 (H) 02/01/2016 1351   TRIG 86 06/11/2021 2323   HDL 54 06/11/2021 2323   HDL 47 02/01/2016 1351   CHOLHDL 2.8 06/11/2021 2323   VLDL 17 06/11/2021 2323   LDLCALC 79 06/11/2021 2323   LDLCALC 143 (H) 02/01/2016 1351   LDLDIRECT 90.8 01/08/2014 0828      Wt Readings from Last 3 Encounters:  01/20/22 118 lb 2 oz (53.6 kg)  09/29/21 118 lb 3.2 oz (53.6 kg)  06/28/21 116 lb 12.8 oz (53 kg)           04/10/2017   11:51 AM   PAD Screen  Previous PAD dx? Yes  Previous surgical procedure? No  Pain with walking? Yes  Subsides with rest? Yes  Feet/toe  relief with dangling? No  Painful, non-healing ulcers? No  Extremities discolored? No      ASSESSMENT AND PLAN:  1.  History of left subclavian artery stenosis.  Status post successful stent placement .  The stent was patent on angiography during cardiac cath in June of last year.  Also her left radial pulse is normal.  2.  Coronary artery disease involving graft coronary arteries without angina: Continue indefinite dual antiplatelet therapy.  I reviewed most recent cardiac catheterization done last year which showed occluded SVG to diagonal with collaterals.  Other grafts were patent.  3.  Peripheral arterial disease: Status post right common iliac artery stent placement for severe claudication with resolution of symptoms.  She complains of recurrent right leg claudication.  I requested a follow-up ABI and aortoiliac duplex.  4.  Essential hypertension: Blood pressure Boyer been in the low side.  I decreased amlodipine to 5 mg once daily.  Avoid aggressive lowering of blood pressure in the setting of aortic stenosis.  5.  Hyperlipidemia: Continue high-dose rosuvastatin and Zetia.  She reports having labs done with her primary care physician which will be requested.  6.  Moderate left carotid stenosis: I requested a follow-up carotid Doppler.  7.  Aortic stenosis: Aortic stenosis was moderate to severe last year on echo in June.  I requested a follow-up echocardiogram to be done in June of this year.   Disposition:   FU with me in 6 months    Signed, Kathlyn Sacramento, MD 01/20/22 [place], [place]

## 2022-01-20 NOTE — Patient Instructions (Signed)
Medication Instructions:  DECREASE the Amlodipine to 5 mg once daily  *If you need a refill on your cardiac medications before your next appointment, please call your pharmacy*   Lab Work: None ordered If you have labs (blood work) drawn today and your tests are completely normal, you will receive your results only by: [place] (if you have MyChart) OR A paper copy in the mail If you have any lab test that is abnormal or we need to change your treatment, we will call you to review the results.   Testing/Procedures: Your physician has requested that you have a carotid duplex. This test is an ultrasound of the carotid arteries in your neck. It looks at blood flow through these arteries that supply the brain with blood.   Allow one hour for this exam.  There are no restrictions or special instructions.  This will take place at [place] ([place]) [address], [place]  Your physician has requested that you have an ankle brachial index (ABI). During this test an ultrasound and blood pressure cuff are used to evaluate the arteries that supply the arms and legs with blood.  Allow thirty minutes for this exam.  There are no restrictions or special instructions.  This will take place at [place] ([place]) [address], [place]  Your physician has recommended that you have an AORTA/ILIAC Duplex. This is a noninvasive diagnostic test that uses ultrasound technology to look at the aorta and iliac arteries to detect any restriction to blood flow to the buttocks, groin, legs or feet.  No food after 11PM the night before.  Water is OK. (Don't drink liquids if you have been instructed not to for ANOTHER test). Avoid foods that produce bowel gas, for 24 hours prior to exam (see below). No breakfast, no chewing gum, no smoking or carbonated beverages. Patient may take morning medications with water. Come in for test at least 15 minutes early  to register. This will take approximately 60 minutes This will take place at [place] ([place]) [phone], [place]  Your physician has requested that you have an echocardiogram in June. Echocardiography is a painless test that uses sound waves to create images of your heart. It provides your doctor with information about the size and shape of your heart and how well your heart's chambers and valves are working.   You may receive an ultrasound enhancing agent through an IV if needed to better visualize your heart during the echo. This procedure takes approximately one hour.  There are no restrictions for this procedure.  This will take place at [place] ([place]) [address], [place]    Follow-Up: At [facility], you and your health needs are our priority.  As part of our continuing mission to provide you with exceptional heart care, we have created designated Provider Care Teams.  These Care Teams include your primary Cardiologist (physician) and Advanced Practice Providers (APPs -  Physician Assistants and Nurse Practitioners) who all work together to provide you with the care you need, when you need it.  We recommend signing up for the patient portal called "MyChart".  Sign up information is provided on this After Visit Summary.  MyChart is used to connect with patients for Virtual Visits (Telemedicine).  Patients are able to view lab/test results, encounter notes, upcoming appointments, etc.  Non-urgent messages can be sent to your provider as well.  To learn more about what you can do with MyChart, go to [email or web address].    Your next appointment:   6 month(s)  Provider:   You may see Kathlyn Sacramento, MD or one of the following Advanced Practice Providers on your designated Care Team:   Murray Hodgkins, NP Christell Faith, PA-C Cadence Kathlen Mody, PA-C Gerrie Nordmann, NP

## 2022-03-16 ENCOUNTER — Other Ambulatory Visit: Payer: Self-pay | Admitting: Cardiovascular Disease

## 2022-03-16 ENCOUNTER — Ambulatory Visit (INDEPENDENT_AMBULATORY_CARE_PROVIDER_SITE_OTHER): Payer: Medicare HMO

## 2022-03-16 ENCOUNTER — Ambulatory Visit: Payer: Medicare HMO | Attending: Cardiovascular Disease

## 2022-03-16 DIAGNOSIS — Z95828 Presence of other vascular implants and grafts: Secondary | ICD-10-CM

## 2022-03-16 DIAGNOSIS — I739 Peripheral vascular disease, unspecified: Secondary | ICD-10-CM

## 2022-03-16 DIAGNOSIS — I6523 Occlusion and stenosis of bilateral carotid arteries: Secondary | ICD-10-CM | POA: Diagnosis not present

## 2022-03-16 LAB — VAS US ABI WITH/WO TBI
Left ABI: 1.06
Right ABI: 1.04

## 2022-03-24 ENCOUNTER — Other Ambulatory Visit: Payer: Self-pay | Admitting: *Deleted

## 2022-03-24 DIAGNOSIS — I739 Peripheral vascular disease, unspecified: Secondary | ICD-10-CM

## 2022-03-24 DIAGNOSIS — I6523 Occlusion and stenosis of bilateral carotid arteries: Secondary | ICD-10-CM

## 2022-06-07 DIAGNOSIS — E039 Hypothyroidism, unspecified: Secondary | ICD-10-CM | POA: Diagnosis not present

## 2022-06-07 DIAGNOSIS — I739 Peripheral vascular disease, unspecified: Secondary | ICD-10-CM | POA: Diagnosis not present

## 2022-06-07 DIAGNOSIS — E782 Mixed hyperlipidemia: Secondary | ICD-10-CM | POA: Diagnosis not present

## 2022-06-07 DIAGNOSIS — Z0000 Encounter for general adult medical examination without abnormal findings: Secondary | ICD-10-CM | POA: Diagnosis not present

## 2022-06-07 DIAGNOSIS — D696 Thrombocytopenia, unspecified: Secondary | ICD-10-CM | POA: Diagnosis not present

## 2022-06-07 DIAGNOSIS — F411 Generalized anxiety disorder: Secondary | ICD-10-CM | POA: Diagnosis not present

## 2022-06-07 DIAGNOSIS — R011 Cardiac murmur, unspecified: Secondary | ICD-10-CM | POA: Diagnosis not present

## 2022-06-07 DIAGNOSIS — I10 Essential (primary) hypertension: Secondary | ICD-10-CM | POA: Diagnosis not present

## 2022-06-07 DIAGNOSIS — I6522 Occlusion and stenosis of left carotid artery: Secondary | ICD-10-CM | POA: Diagnosis not present

## 2022-06-07 DIAGNOSIS — E559 Vitamin D deficiency, unspecified: Secondary | ICD-10-CM | POA: Diagnosis not present

## 2022-06-16 ENCOUNTER — Ambulatory Visit: Payer: Medicare HMO | Attending: Cardiovascular Disease

## 2022-07-21 ENCOUNTER — Ambulatory Visit: Payer: Medicare HMO

## 2022-08-25 ENCOUNTER — Ambulatory Visit: Payer: Medicare HMO | Attending: Cardiovascular Disease

## 2022-08-25 DIAGNOSIS — I350 Nonrheumatic aortic (valve) stenosis: Secondary | ICD-10-CM | POA: Diagnosis not present

## 2022-08-25 LAB — ECHOCARDIOGRAM COMPLETE
AR max vel: 0.86 cm2
AV Area VTI: 0.83 cm2
AV Area mean vel: 0.8 cm2
AV Mean grad: 31.8 mmHg
AV Peak grad: 59.4 mmHg
Ao pk vel: 3.85 m/s
Area-P 1/2: 3.08 cm2
Calc EF: 51 %
S' Lateral: 3.1 cm
Single Plane A2C EF: 51.4 %
Single Plane A4C EF: 53.2 %

## 2022-08-30 ENCOUNTER — Ambulatory Visit: Payer: Medicare HMO | Admitting: Cardiovascular Disease

## 2022-08-31 ENCOUNTER — Encounter: Payer: Self-pay | Admitting: Cardiovascular Disease

## 2022-08-31 ENCOUNTER — Ambulatory Visit: Payer: Medicare HMO | Attending: Cardiovascular Disease | Admitting: Cardiovascular Disease

## 2022-08-31 VITALS — BP 110/64 | HR 73 | Ht 64.0 in | Wt 111.1 lb

## 2022-08-31 DIAGNOSIS — I771 Stricture of artery: Secondary | ICD-10-CM

## 2022-08-31 DIAGNOSIS — I359 Nonrheumatic aortic valve disorder, unspecified: Secondary | ICD-10-CM | POA: Diagnosis not present

## 2022-08-31 DIAGNOSIS — I350 Nonrheumatic aortic (valve) stenosis: Secondary | ICD-10-CM

## 2022-08-31 DIAGNOSIS — E785 Hyperlipidemia, unspecified: Secondary | ICD-10-CM

## 2022-08-31 DIAGNOSIS — I10 Essential (primary) hypertension: Secondary | ICD-10-CM

## 2022-08-31 DIAGNOSIS — I739 Peripheral vascular disease, unspecified: Secondary | ICD-10-CM | POA: Diagnosis not present

## 2022-08-31 DIAGNOSIS — I2510 Atherosclerotic heart disease of native coronary artery without angina pectoris: Secondary | ICD-10-CM | POA: Diagnosis not present

## 2022-08-31 NOTE — Patient Instructions (Signed)
Medication Instructions:  No changes *If you need a refill on your cardiac medications before your next appointment, please call your pharmacy*   Lab Work: None ordered If you have labs (blood work) drawn today and your tests are completely normal, you will receive your results only by: MyChart Message (if you have MyChart) OR A paper copy in the mail If you have any lab test that is abnormal or we need to change your treatment, we will call you to review the results.   Testing/Procedures: None ordered   Follow-Up: At [facility], you and your health needs are our priority.  As part of our continuing mission to provide you with exceptional heart care, we have created designated Provider Care Teams.  These Care Teams include your primary Cardiologist (physician) and Advanced Practice Providers (APPs -  Physician Assistants and Nurse Practitioners) who all work together to provide you with the care you need, when you need it.  We recommend signing up for the patient portal called "MyChart".  Sign up information is provided on this After Visit Summary.  MyChart is used to connect with patients for Virtual Visits (Telemedicine).  Patients are able to view lab/test results, encounter notes, upcoming appointments, etc.  Non-urgent messages can be sent to your provider as well.   To learn more about what you can do with MyChart, go to [email or web address].    Your next appointment:   4 month(s)  Provider:   You may see [name], MD or one of the following Advanced Practice Providers on your designated Care Team:   Nicolasa Ducking, NP Eula Listen, PA-C Cadence Fransico Michael, PA-C Charlsie Quest, NP    Other Instructions A referral has been sent to the Structural Team. They will call you to set up an appointment

## 2022-08-31 NOTE — Progress Notes (Signed)
Cardiology Office Note   Date:  08/31/2022   ID:  Dawn Boyer, Dawn Boyer [birth year], MRN [id number]  PCP:  [name], MD  Cardiologist:  [name], MD   Chief Complaint  Patient presents with   Follow up Echo     Patient c/o back pain & shortness of breath. Medications reviewed by the patient verbally.       History of Present Illness: Dawn Boyer is a 77 y.o. female who is here today for a follow up visit regarding left subclavian artery stenosis and peripheral arterial disease. She has known history of coronary artery disease status post CABG in 1994, essential hypertension,  left carotid stenosis, hypothyroidism and hyperlipidemia.  There is remote history of tobacco use. She had worsening angina in 2019. She underwent cardiac catheterization which showed severe underlying three-vessel coronary artery disease with patent LIMA to LAD, SVG to diagonal and SVG to left PDA.  There was severe stenosis in SVG to diagonal which was treated successfully with drug-eluting stent placement. She was found to have severe heavily calcified stenosis in the left subclavian artery likely compromising the flow into the LIMA.  The patient had left subclavian artery stenting in July of 2019.  She was subsequently found to have peripheral arterial disease with severe right leg claudication. Angiography performed 10/03/2017 showed heavily calcified abdominal aorta with moderate stenosis distally, significant right renal artery stenosis, significant right common iliac artery stenosis at the ostium with moderate disease distally and moderate left iliac artery disease.  I performed successful stent placement to the ostial right common iliac artery which extended 2 mm into the distal aorta.  She was hospitalized in June 2023 with unstable angina.  Right and left cardiac catheterization showed occluded SVG to diagonal with collaterals.  Medical therapy was recommended.  Echocardiogram showed moderate aortic  stenosis with mean gradient of 29 mmHg.  She reports slight worsening of shortness of breath and fatigue.  She does complain of back pain.  She had an echocardiogram done which showed progression of aortic stenosis to the severe range with mean gradient of 38 mmHg.   Past Medical History:  Diagnosis Date   Anemia    hx   Anxiety    Arthritis    Carotid arterial disease (HCC)    a. 04/2015 Carotid U/S: RICA 1-39%, LICA 40-59%, no change.   Coronary artery disease    a. 1994 s/p CABG;  b. 03/2012 MV: No ischemia/infarct. DES SVG-Diag 2019   Depression    Diverticulosis    Elevated LFTs    Fibrocystic breast    GERD (gastroesophageal reflux disease)    Hemorrhoid    Hemorrhoids    History of hemolysis, elevated liver enzymes, and low platelet (HELLP) syndrome, currently pregnant    patient denies history   Hyperlipidemia    Hypertension    Hypertensive heart disease    Hypothyroidism    Insomnia    Nephrolithiasis    PONV (postoperative nausea and vomiting)    after heart surgery    Past Surgical History:  Procedure Laterality Date   ABDOMINAL AORTOGRAM W/LOWER EXTREMITY N/A 10/03/2017   Procedure: ABDOMINAL AORTOGRAM W/LOWER EXTREMITY;  Surgeon: Iran Ouch, MD;  Location: [facility];  Service: Cardiovascular;  Laterality: N/A;  Bilateral Limited   AORTIC ARCH ANGIOGRAPHY N/A 07/25/2017   Procedure: AORTIC ARCH ANGIOGRAPHY;  Surgeon: Iran Ouch, MD;  Location: [facility];  Service: Cardiovascular;  Laterality: N/A;   CARDIAC  CATHETERIZATION     CHOLECYSTECTOMY  2002   CORONARY ARTERY BYPASS GRAFT  1993   CORONARY STENT INTERVENTION N/A 04/03/2017   Procedure: CORONARY STENT INTERVENTION;  Surgeon: Corky Crafts, MD;  Location[facility] INVASIVE CV LAB;  Service: Cardiovascular;  Laterality: N/A;   EVALUATION UNDER ANESTHESIA WITH HEMORRHOIDECTOMY N/A 03/14/2012   Procedure: EXAM UNDER ANESTHESIA WITH Excisional HEMORRHOIDECTOMY and Hemorrhoidal  banding;  Surgeon: Atilano Ina, MD;  Location: [facility] OR;  Service: General;  Laterality: N/A;  x 2 hemorrhoids   HEMORRHOID SURGERY     LEFT HEART CATH AND CORS/GRAFTS ANGIOGRAPHY N/A 04/03/2017   Procedure: LEFT HEART CATH AND CORS/GRAFTS ANGIOGRAPHY;  Surgeon: Corky Crafts, MD;  Location: [facility];  Service: Cardiovascular;  Laterality: N/A;   PERIPHERAL VASCULAR INTERVENTION Left 07/25/2017   Procedure: PERIPHERAL VASCULAR INTERVENTION;  Surgeon: Iran Ouch, MD;  Location: [facility];  Service: Cardiovascular;  Laterality: Left;  Subclavian   PERIPHERAL VASCULAR INTERVENTION  10/03/2017   Procedure: PERIPHERAL VASCULAR INTERVENTION;  Surgeon: Iran Ouch, MD;  Location: [facility];  Service: Cardiovascular;;  LCIA   RIGHT/LEFT HEART CATH AND CORONARY/GRAFT ANGIOGRAPHY N/A 06/13/2021   Procedure: RIGHT/LEFT HEART CATH AND CORONARY/GRAFT ANGIOGRAPHY;  Surgeon: Corky Crafts, MD;  Location: [facility] CV LAB;  Service: Cardiovascular;  Laterality: N/A;   TUBAL LIGATION  1970   ULTRASOUND GUIDANCE FOR VASCULAR ACCESS  04/03/2017   Procedure: Ultrasound Guidance For Vascular Access;  Surgeon: Corky Crafts, MD;  Location: [facility] INVASIVE CV LAB;  Service: Cardiovascular;;   UPPER EXTREMITY ANGIOGRAPHY Left 07/25/2017   Procedure: UPPER EXTREMITY ANGIOGRAPHY;  Surgeon: Iran Ouch, MD;  Location: [facility];  Service: Cardiovascular;  Laterality: Left;     Current Outpatient Medications  Medication Sig Dispense Refill   ALPRAZolam (XANAX) 1 MG tablet Take 1 mg by mouth at bedtime.      amLODipine (NORVASC) 5 MG tablet Take 1 tablet (5 mg total) by mouth daily. 90 tablet 3   b complex vitamins capsule Take 1 capsule by mouth daily.     cholecalciferol (VITAMIN D3) 25 MCG (1000 UNIT) tablet Take 5,000 Units by mouth daily. W Vit K ( K2 and D3)     clopidogrel (PLAVIX) 75 MG tablet TAKE 1 TABLET EVERY DAY 90 tablet 3   co-enzyme Q-10  30 MG capsule Take 30 mg by mouth 2 (two) times daily.     enalapril (VASOTEC) 10 MG tablet Take by mouth.     isosorbide mononitrate (IMDUR) 30 MG 24 hr tablet Take 1/2 tablet (15 mg total) by mouth daily. 90 tablet 3   levothyroxine (SYNTHROID, LEVOTHROID) 50 MCG tablet Take 50 mcg by mouth daily before breakfast.      linaclotide (LINZESS) 290 MCG CAPS capsule Take 290 mcg by mouth daily as needed (constipation).     metoprolol succinate (TOPROL-XL) 50 MG 24 hr tablet Take 1 tablet (50 mg total) by mouth 2 (two) times daily. Take with or immediately following a meal. 180 tablet 3   Multiple Vitamins-Minerals (ZINC PO) Take by mouth daily in the afternoon.     nitroGLYCERIN (NITROSTAT) 0.4 MG SL tablet Place 1 tablet (0.4 mg total) under the tongue every 5 (five) minutes as needed for chest pain. PLACE ONE TABLET UNDER THE TONGUE EVERY 5 MINUTES FOR THREE DOSES AS NEEDED FOR CHEST PAIN 25 tablet 3   pantoprazole (PROTONIX) 40 MG tablet Take 40 mg by mouth at  bedtime.     sertraline (ZOLOFT) 100 MG tablet Take 50 mg by mouth daily.     Sodium Phosphates (ENEMA RE) Place 1 each rectally 2 (two) times daily. May use an additional 2 times as needed for constipation     vitamin C (ASCORBIC ACID) 500 MG tablet Take 500 mg by mouth daily.     No current facility-administered medications for this visit.    Allergies:   Patient has no known allergies.    Social History:  The patient  reports that she quit smoking about 39 years ago. Her smoking use included cigarettes. She started smoking about 51 years ago. She has a 12 pack-year smoking history. She has never used smokeless tobacco. She reports that she does not drink alcohol and does not use drugs.   Family History:  The patient's family history includes Diabetes in her daughter; Heart Problems in her son; Heart disease in her brother; Hyperlipidemia in her sister; Hypertension in her sister; Stroke in her sister.    ROS:  Please see the history  of present illness.   Otherwise, review of systems are positive for none.   All other systems are reviewed and negative.    PHYSICAL EXAM: VS:  BP 110/64 (BP Location: Left Arm, Patient Position: Sitting, Cuff Size: Normal)   Pulse 73   Ht 5\' 4"  (1.626 m)   Wt 111 lb 2 oz (50.4 kg)   SpO2 98%   BMI 19.07 kg/m  , BMI Body mass index is 19.07 kg/m. GEN: Well nourished, well developed, in no acute distress  HEENT: normal  Neck: no JVD.  Bilateral carotid bruits and a bruit in the left subclavian area. Cardiac: RRR; no  rubs, or gallops,no edema.  3/ 6 late peaking systolic murmur in the aortic area Respiratory:  clear to auscultation bilaterally, normal work of breathing GI: soft, nontender, nondistended, + BS MS: no deformity or atrophy  Skin: warm and dry, no rash Neuro:  Strength and sensation are intact Psych: euthymic mood, full affect Vascular: Distal pulses are normal on the left but diminished on the right.   EKG:  EKG is ordered today.  Normal sinus rhythm Marked ST abnormality, possible lateral subendocardial injury When compared with ECG of 11-Jun-2021 08:45, since last tracing no significant change    Recent Labs: No results found for requested labs within last 365 days.    Lipid Panel    Component Value Date/Time   CHOL 150 06/11/2021 2323   CHOL 239 (H) 02/01/2016 1351   TRIG 86 06/11/2021 2323   HDL 54 06/11/2021 2323   HDL 47 02/01/2016 1351   CHOLHDL 2.8 06/11/2021 2323   VLDL 17 06/11/2021 2323   LDLCALC 79 06/11/2021 2323   LDLCALC 143 (H) 02/01/2016 1351   LDLDIRECT 90.8 01/08/2014 0828      Wt Readings from Last 3 Encounters:  08/31/22 111 lb 2 oz (50.4 kg)  01/20/22 118 lb 2 oz (53.6 kg)  09/29/21 118 lb 3.2 oz (53.6 kg)           04/10/2017   11:51 AM  PAD Screen  Previous PAD dx? Yes  Previous surgical procedure? No  Pain with walking? Yes  Subsides with rest? Yes  Feet/toe relief with dangling? No  Painful, non-healing ulcers?  No  Extremities discolored? No      ASSESSMENT AND PLAN:  1.  History of left subclavian artery stenosis.  Status post successful stent placement .  The stent was patent  on angiography during cardiac cath in June of last year.  Also her left radial pulse is normal.  2.  Coronary artery disease involving graft coronary arteries without angina: Continue indefinite dual antiplatelet therapy.  Most recent cardiac catheterization showed occluded SVG to diagonal with collaterals.  Other grafts were patent.    3.  Peripheral arterial disease: Status post right common iliac artery stent placement for severe claudication with resolution of symptoms.  She complains of recurrent right leg claudication.  I requested a follow-up ABI and aortoiliac duplex.  4.  Essential hypertension: Blood pressure is well-controlled on current medications.  5.  Hyperlipidemia: Unfortunately, she stopped taking rosuvastatin and ezetimibe based on personal beliefs.  There is a strong indication for treatment considering her extensive cardiovascular disease and I discussed with her extensively today.  I do not think I was able to change her mind.  6.  Moderate left carotid stenosis: This was stable on carotid Doppler in March.  7.  Severe aortic stenosis: Symptomatic with increased exertional dyspnea.  I recommend proceeding with TAVR evaluation but she is not sure she wants to have it done yet.  She wants to hear about the procedure and will refer her to the valve clinic.  Once she decides on having it done, I can proceed with right and left cardiac catheterization likely from the femoral approach given the difficulty with left radial cath last time related to calcifications in the presence of left subclavian artery stent.  This was a prolonged visit that required 45 minutes of face-to-face discussion regarding management of aortic stenosis as well as hyperlipidemia treatment.  Disposition:   FU with me in 4  months    Signed, [name], MD 08/31/22 [facility] [place], [place] [phone]

## 2022-09-17 NOTE — Progress Notes (Unsigned)
Cardiology Office Note:    Date:  09/19/2022   ID:  Dawn Boyer, DOB May 07, 1945, MRN [id number]  PCP:  Dawn Has, MD   [facility] Providers Cardiologist:  Dawn Bears, MD     Referring MD: Dawn Has, MD   Chief Complaint  Patient presents with   Aortic Stenosis    History of Present Illness:    Dawn Boyer is a 77 y.o. female referred by Dr Dawn Boyer for evaluation of aortic stenosis. The patient Boyer a history of coronary artery disease with remote CABG in 1994.  She also Boyer peripheral arterial disease with history of left subclavian artery stenting in 2019, history of saphenous vein graft stenting also in 2019, and history of right iliac stenting for lower extremity PAD.  The patient had a recent echo which demonstrated progressive aortic stenosis now in the severe range and she is referred here for further discussion/evaluation.  The patient is here with her daughter today.  She Boyer known about a heart murmur for the past few years.  She describes a feeling of discomfort in her neck and jaw as well as shortness of breath that occurs sometimes with anxiety and other times with physical activity.  Her symptoms have been relatively stable over the past year with maybe a slight progression.  She Boyer not had orthopnea, PND, abdominal swelling, or leg swelling.  She Boyer had no lightheadedness or syncope.  Past Medical History:  Diagnosis Date   Anemia    hx   Anxiety    Arthritis    Carotid arterial disease (HCC)    a. 04/2015 Carotid U/S: RICA 1-39%, LICA 40-59%, no change.   Coronary artery disease    a. 1994 s/p CABG;  b. 03/2012 MV: No ischemia/infarct. DES SVG-Diag 2019   Depression    Diverticulosis    Elevated LFTs    Fibrocystic breast    GERD (gastroesophageal reflux disease)    Hemorrhoid    Hemorrhoids    History of hemolysis, elevated liver enzymes, and low platelet (HELLP) syndrome, currently pregnant    patient denies history   Hyperlipidemia     Hypertension    Hypertensive heart disease    Hypothyroidism    Insomnia    Nephrolithiasis    PONV (postoperative nausea and vomiting)    after heart surgery    Past Surgical History:  Procedure Laterality Date   ABDOMINAL AORTOGRAM W/LOWER EXTREMITY N/A 10/03/2017   Procedure: ABDOMINAL AORTOGRAM W/LOWER EXTREMITY;  Surgeon: Iran Ouch, MD;  Location: [facility] INVASIVE CV LAB;  Service: Cardiovascular;  Laterality: N/A;  Bilateral Limited   AORTIC ARCH ANGIOGRAPHY N/A 07/25/2017   Procedure: AORTIC ARCH ANGIOGRAPHY;  Surgeon: Iran Ouch, MD;  Location: [facility];  Service: Cardiovascular;  Laterality: N/A;   CARDIAC CATHETERIZATION     CHOLECYSTECTOMY  2002   CORONARY ARTERY BYPASS GRAFT  1993   CORONARY STENT INTERVENTION N/A 04/03/2017   Procedure: CORONARY STENT INTERVENTION;  Surgeon: Corky Crafts, MD;  Location: [facility] INVASIVE CV LAB;  Service: Cardiovascular;  Laterality: N/A;   EVALUATION UNDER ANESTHESIA WITH HEMORRHOIDECTOMY N/A 03/14/2012   Procedure: EXAM UNDER ANESTHESIA WITH Excisional HEMORRHOIDECTOMY and Hemorrhoidal banding;  Surgeon: Atilano Ina, MD;  Location: [facility] OR;  Service: General;  Laterality: N/A;  x 2 hemorrhoids   HEMORRHOID SURGERY     LEFT HEART CATH AND CORS/GRAFTS ANGIOGRAPHY N/A 04/03/2017   Procedure: LEFT HEART CATH AND CORS/GRAFTS ANGIOGRAPHY;  Surgeon: Corky Crafts,  MD;  Location: [facility];  Service: Cardiovascular;  Laterality: N/A;   PERIPHERAL VASCULAR INTERVENTION Left 07/25/2017   Procedure: PERIPHERAL VASCULAR INTERVENTION;  Surgeon: Iran Ouch, MD;  Location: [facility];  Service: Cardiovascular;  Laterality: Left;  Subclavian   PERIPHERAL VASCULAR INTERVENTION  10/03/2017   Procedure: PERIPHERAL VASCULAR INTERVENTION;  Surgeon: Iran Ouch, MD;  Location: [facility];  Service: Cardiovascular;;  LCIA   RIGHT/LEFT HEART CATH AND CORONARY/GRAFT ANGIOGRAPHY N/A 06/13/2021   Procedure:  RIGHT/LEFT HEART CATH AND CORONARY/GRAFT ANGIOGRAPHY;  Surgeon: Corky Crafts, MD;  Location: [facility] INVASIVE CV LAB;  Service: Cardiovascular;  Laterality: N/A;   TUBAL LIGATION  1970   ULTRASOUND GUIDANCE FOR VASCULAR ACCESS  04/03/2017   Procedure: Ultrasound Guidance For Vascular Access;  Surgeon: Corky Crafts, MD;  Location: [facility] INVASIVE CV LAB;  Service: Cardiovascular;;   UPPER EXTREMITY ANGIOGRAPHY Left 07/25/2017   Procedure: UPPER EXTREMITY ANGIOGRAPHY;  Surgeon: Iran Ouch, MD;  Location: [facility] INV[facility] CV [facility];  Service: Cardiovascular;  Laterality: Left;    Current Medications: Current Meds  Medication Sig   ALPRAZolam (XANAX) 1 MG tablet Take 1 mg by mouth at bedtime.    amLODipine (NORVASC) 5 MG tablet Take 1 tablet (5 mg total) by mouth daily.   b complex vitamins capsule Take 1 capsule by mouth daily.   cholecalciferol (VITAMIN D3) 25 MCG (1000 UNIT) tablet Take 5,000 Units by mouth daily. W Vit K ( K2 and D3)   clopidogrel (PLAVIX) 75 MG tablet TAKE 1 TABLET EVERY DAY   co-enzyme Q-10 30 MG capsule Take 30 mg by mouth 2 (two) times daily.   enalapril (VASOTEC) 10 MG tablet Take by mouth.   ezetimibe (ZETIA) 10 MG tablet Take 10 mg by mouth daily.   isosorbide mononitrate (IMDUR) 30 MG 24 hr tablet Take 1/2 tablet (15 mg total) by mouth daily.   levothyroxine (SYNTHROID, LEVOTHROID) 50 MCG tablet Take 50 mcg by mouth daily before breakfast.    linaclotide (LINZESS) 290 MCG CAPS capsule Take 290 mcg by mouth daily as needed (constipation).   metoprolol succinate (TOPROL-XL) 50 MG 24 hr tablet Take 1 tablet (50 mg total) by mouth 2 (two) times daily. Take with or immediately following a meal.   Multiple Vitamins-Minerals (ZINC PO) Take by mouth daily in the afternoon.   nitroGLYCERIN (NITROSTAT) 0.4 MG SL tablet Place 1 tablet (0.4 mg total) under the tongue every 5 (five) minutes as needed for chest pain. PLACE ONE TABLET UNDER THE TONGUE EVERY 5 MINUTES FOR THREE  DOSES AS NEEDED FOR CHEST PAIN   pantoprazole (PROTONIX) 40 MG tablet Take 40 mg by mouth at bedtime.   sertraline (ZOLOFT) 100 MG tablet Take 50 mg by mouth daily.   Sodium Phosphates (ENEMA RE) Place 1 each rectally 2 (two) times daily. May use an additional 2 times as needed for constipation   vitamin C (ASCORBIC ACID) 500 MG tablet Take 500 mg by mouth daily.     Allergies:   Patient Boyer no known allergies.   Social History   Socioeconomic History   Marital status: Married    Spouse name: Not on file   Number of children: Not on file   Years of education: Not on file   Highest education level: Not on file  Occupational History   Not on file  Tobacco Use   Smoking status: Former    Current packs/day: 0.00    Average packs/day: 1  pack/day for 12.0 years (12.0 ttl pk-yrs)    Types: Cigarettes    Start date: 11/21/1970    Quit date: 11/21/1982    Years since quitting: 39.8   Smokeless tobacco: Never  Vaping Use   Vaping status: Never Used  Substance and Sexual Activity   Alcohol use: No    Alcohol/week: 0.0 standard drinks of alcohol   Drug use: No   Sexual activity: Not on file  Other Topics Concern   Not on file  Social History Narrative   Not on file   Social Determinants of Health   Financial Resource Strain: Not on file  Food Insecurity: Not on file  Transportation Needs: Not on file  Physical Activity: Not on file  Stress: Not on file  Social Connections: Not on file     Family History: The patient's family history includes Diabetes in her daughter; Heart Problems in her son; Heart disease in her brother; Hyperlipidemia in her sister; Hypertension in her sister; Stroke in her sister. There is no history of Colon cancer, Esophageal cancer, Rectal cancer, or Stomach cancer.  ROS:   Please see the history of present illness.    All other systems reviewed and are negative.  EKGs/Labs/Other Studies Reviewed:    The following studies were reviewed  today: Echo:  1. Left ventricular ejection fraction, by estimation, is 55 to 60%. The  left ventricle Boyer normal function. The left ventricle Boyer no regional  wall motion abnormalities. There is mild left ventricular hypertrophy.  Left ventricular diastolic parameters  are consistent with Grade I diastolic dysfunction (impaired relaxation).   2. Right ventricular systolic function is normal. The right ventricular  size is normal.   3. Left atrial size was moderately dilated.   4. The mitral valve is normal in structure. Mild mitral valve  regurgitation.   5. Mean aortic valve gradient , Peak G , Vmax 3.95 m/s, DVI=  0.23, AVA 0.8cm2.. The aortic valve is tricuspid. Aortic valve  regurgitation is mild. Severe aortic valve stenosis.   6. The inferior vena cava is normal in size with greater than 50%  respiratory variability, suggesting right atrial pressure of 3 mmHg.      Cardiac Cath 06/13/2021:   Suezanne Jacquet LM to Mid LM lesion is 100% stenosed-known from prior cath.  LIMA to LAD is widely patent.   Ost Cx to Prox Cx lesion is 100% stenosed-known from prior cath.  SVG to OM is widely patent.   Ost 1st Diag lesion is 100% stenosed. SVG to diagonal is occluded.  Left to left collaterals fill the distal diagonal.   Widely patent left subclavian stent.  No pressure gradient noted.  There was difficulty in torquing catheters due to the stent placement.   LV end diastolic pressure is mildly elevated.   There is mild aortic valve stenosis.   Aortic saturation 95%, PA saturation 64%, mean PA pressure 10 mmHg, mean pulmonary capillary wedge pressure 3 mmHg, cardiac output 4.38 L/min, cardiac index 2.95.   Occluded SVG to diagonal.  Left to left collaterals fill this relatively small vessel.  Continue medical therapy.  Mild aortic stenosis by pressure gradient, although calculated valve area 0.95 cm.  There was difficulty in advancing the catheter across the valve; however, this could have been  due to the proximal left subclavian stent which made catheter manipulation difficult.   If emergent cardiac cath was needed, would consider groin approach.  Recent Labs: 09/18/2022: BUN 22; Creatinine, Ser 1.01; Potassium 4.6; Sodium  140  Recent Lipid Panel    Component Value Date/Time   CHOL 150 06/11/2021 2323   CHOL 239 (H) 02/01/2016 1351   TRIG 86 06/11/2021 2323   HDL 54 06/11/2021 2323   HDL 47 02/01/2016 1351   CHOLHDL 2.8 06/11/2021 2323   VLDL 17 06/11/2021 2323   LDLCALC 79 06/11/2021 2323   LDLCALC 143 (H) 02/01/2016 1351   LDLDIRECT 90.8 01/08/2014 0828     Risk Assessment/Calculations:           Physical Exam:    VS:  BP (!) 152/70   Pulse 63   Ht 5' 3.5" (1.613 m)   Wt 115 lb 3.2 oz (52.3 kg)   SpO2 98%   BMI 20.09 kg/m     Wt Readings from Last 3 Encounters:  09/18/22 115 lb 3.2 oz (52.3 kg)  08/31/22 111 lb 2 oz (50.4 kg)  01/20/22 118 lb 2 oz (53.6 kg)     GEN:  Well nourished, well developed in no acute distress HEENT: Normal NECK: No JVD; No carotid bruits LYMPHATICS: No lymphadenopathy CARDIAC: RRR, 3/6 crescendo decrescendo murmur at the right upper sternal border, no diastolic murmur RESPIRATORY:  Clear to auscultation without rales, wheezing or rhonchi  ABDOMEN: Soft, non-tender, non-distended MUSCULOSKELETAL:  No edema; No deformity  SKIN: Warm and dry NEUROLOGIC:  Alert and oriented x 3 PSYCHIATRIC:  Normal affect   ASSESSMENT:    1. Nonrheumatic aortic valve stenosis   2. Pre-procedural cardiovascular examination    PLAN:    In order of problems listed above:  The patient Boyer severe, stage D1 aortic stenosis. I have reviewed the natural history of aortic stenosis with the patient and their family members who are present today. We have discussed the limitations of medical therapy and the poor prognosis associated with symptomatic aortic stenosis. We have reviewed potential treatment options, including palliative medical therapy,  conventional surgical aortic valve replacement, and transcatheter aortic valve replacement. We discussed treatment options in the context of the patient's specific comorbid medical conditions.  Specific comorbidities include remote CABG, left carotid stenosis, left subclavian stenosis status post stenting, lower extremity PAD status post right iliac stenting, and hypertensive heart disease.  Her recent echo results are reviewed and demonstrate normal LVEF of 55 to 60% with no regional wall motion abnormalities, mild LVH, essentially normal mitral and tricuspid valve function with only mild regurgitant lesions, and severe aortic stenosis with peak and mean gradients of 62 and 38 mmHg, respectively, a peak systolic velocity of 3.95 m/s, dimensionless index of 0.23, and calculated aortic valve area of 0.8 cm.  I am concerned that the patient's peripheral access may be complex.  As outlined above she Boyer both lower extremity PAD and left subclavian disease with previous stenting.  I reviewed her last cath that included left subclavian angiography.  There is angulation of the subclavian artery into the stented segment that I think will not allow for insertion of a TAVR valve.  Hopefully she will have femoral access.  The patient is agreeable to proceed with CT angiography studies to better define her cardiac and peripheral anatomy.  However, she does not feel ready to proceed with TAVR.  The patient does not feel ready to move forward with the TAVR procedure at this time, but she is agreeable to do the CT scans.  She would like to wait several months for follow-up and I counseled her about potential symptoms.  I will plan on seeing her back in 3  months.           Medication Adjustments/Labs and Tests Ordered: Current medicines are reviewed at length with the patient today.  Concerns regarding medicines are outlined above.  Orders Placed This Encounter  Procedures   Basic metabolic panel   No orders of the  defined types were placed in this encounter.   Patient Instructions  Medication Instructions:  Your physician recommends that you continue on your current medications as directed. Please refer to the Current Medication list given to you today.  *If you need a refill on your cardiac medications before your next appointment, please call your pharmacy*   Lab Work: BMET today If you have labs (blood work) drawn today and your tests are completely normal, you will receive your results only by: MyChart Message (if you have MyChart) OR A paper copy in the mail If you have any lab test that is abnormal or we need to change your treatment, we will call you to review the results.   Testing/Procedures: TAVR CT Angiography scans (you will be called to schedule)   Follow-Up: At [facility], you and your health needs are our priority.  As part of our continuing mission to provide you with exceptional heart care, we have created designated Provider Care Teams.  These Care Teams include your primary Cardiologist (physician) and Advanced Practice Providers (APPs -  Physician Assistants and Nurse Practitioners) who all work together to provide you with the care you need, when you need it.  Your next appointment:   Structural team to follow-up  Provider:   Tonny Bollman, MD     Signed, Tonny Bollman, MD  09/19/2022 4:56 PM    [facility]

## 2022-09-18 ENCOUNTER — Ambulatory Visit: Payer: Medicare HMO | Attending: Cardiovascular Disease | Admitting: Cardiovascular Disease

## 2022-09-18 ENCOUNTER — Encounter: Payer: Self-pay | Admitting: Cardiovascular Disease

## 2022-09-18 VITALS — BP 152/70 | HR 63 | Ht 63.5 in | Wt 115.2 lb

## 2022-09-18 DIAGNOSIS — Z01810 Encounter for preprocedural cardiovascular examination: Secondary | ICD-10-CM | POA: Diagnosis not present

## 2022-09-18 DIAGNOSIS — I350 Nonrheumatic aortic (valve) stenosis: Secondary | ICD-10-CM

## 2022-09-18 NOTE — Patient Instructions (Signed)
Medication Instructions:  Your physician recommends that you continue on your current medications as directed. Please refer to the Current Medication list given to you today.  *If you need a refill on your cardiac medications before your next appointment, please call your pharmacy*   Lab Work: BMET today If you have labs (blood work) drawn today and your tests are completely normal, you will receive your results only by: MyChart Message (if you have MyChart) OR A paper copy in the mail If you have any lab test that is abnormal or we need to change your treatment, we will call you to review the results.   Testing/Procedures: TAVR CT Angiography scans (you will be called to schedule)   Follow-Up: At [facility], you and your health needs are our priority.  As part of our continuing mission to provide you with exceptional heart care, we have created designated Provider Care Teams.  These Care Teams include your primary Cardiologist (physician) and Advanced Practice Providers (APPs -  Physician Assistants and Nurse Practitioners) who all work together to provide you with the care you need, when you need it.  Your next appointment:   Structural team to follow-up  Provider:   Tonny Bollman, MD

## 2022-09-19 LAB — BASIC METABOLIC PANEL
BUN/Creatinine Ratio: 22 (ref 12–28)
BUN: 22 mg/dL (ref 8–27)
CO2: 25 mmol/L (ref 20–29)
Calcium: 9.8 mg/dL (ref 8.7–10.3)
Chloride: 101 mmol/L (ref 96–106)
Creatinine, Ser: 1.01 mg/dL — ABNORMAL HIGH (ref 0.57–1.00)
Glucose: 90 mg/dL (ref 70–99)
Potassium: 4.6 mmol/L (ref 3.5–5.2)
Sodium: 140 mmol/L (ref 134–144)
eGFR: 58 mL/min/{1.73_m2} — ABNORMAL LOW (ref >59)

## 2022-09-20 ENCOUNTER — Encounter: Payer: Self-pay | Admitting: Physician Assistant

## 2022-09-20 ENCOUNTER — Other Ambulatory Visit: Payer: Self-pay | Admitting: Physician Assistant

## 2022-09-20 DIAGNOSIS — I350 Nonrheumatic aortic (valve) stenosis: Secondary | ICD-10-CM

## 2022-10-02 ENCOUNTER — Ambulatory Visit (HOSPITAL_COMMUNITY)
Admission: RE | Admit: 2022-10-02 | Discharge: 2022-10-02 | Disposition: A | Discharge status: Home / Self Care | Payer: Medicare HMO | Source: Ambulatory Visit | Attending: Physician Assistant | Admitting: Physician Assistant

## 2022-10-02 ENCOUNTER — Encounter: Payer: Self-pay | Admitting: Physician Assistant

## 2022-10-02 DIAGNOSIS — I350 Nonrheumatic aortic (valve) stenosis: Secondary | ICD-10-CM | POA: Insufficient documentation

## 2022-10-02 DIAGNOSIS — Z01818 Encounter for other preprocedural examination: Secondary | ICD-10-CM | POA: Diagnosis not present

## 2022-10-02 DIAGNOSIS — M799 Soft tissue disorder, unspecified: Secondary | ICD-10-CM | POA: Diagnosis not present

## 2022-10-02 DIAGNOSIS — Z951 Presence of aortocoronary bypass graft: Secondary | ICD-10-CM | POA: Diagnosis not present

## 2022-10-02 DIAGNOSIS — Z9049 Acquired absence of other specified parts of digestive tract: Secondary | ICD-10-CM | POA: Diagnosis not present

## 2022-10-02 MED ORDER — IOHEXOL 350 MG/ML SOLN
95.0000 mL | Freq: Once | INTRAVENOUS | Status: AC | PRN
  Administered 2022-10-02: 95 mL via INTRAVENOUS

## 2022-10-02 NOTE — Progress Notes (Signed)
  STS score  Procedure Type: Isolated AVR Perioperative Outcome Estimate % Operative Mortality 5.47% Morbidity & Mortality 12% Stroke 1.96% Renal Failure 2.14% Reoperation 5.63% Prolonged Ventilation 6.48% Deep Sternal Wound Infection 0.048% Long Hospital Stay (>14 days) 5.54% Short Hospital Stay (<6 days)* 39.6%

## 2022-11-25 ENCOUNTER — Other Ambulatory Visit: Payer: Self-pay | Admitting: Cardiovascular Disease

## 2022-12-18 ENCOUNTER — Ambulatory Visit: Payer: Medicare HMO | Admitting: Cardiovascular Disease

## 2022-12-21 ENCOUNTER — Ambulatory Visit: Payer: Medicare HMO | Admitting: Cardiovascular Disease

## 2023-01-04 DIAGNOSIS — I10 Essential (primary) hypertension: Secondary | ICD-10-CM | POA: Diagnosis not present

## 2023-01-04 DIAGNOSIS — R7309 Other abnormal glucose: Secondary | ICD-10-CM | POA: Diagnosis not present

## 2023-01-04 DIAGNOSIS — F411 Generalized anxiety disorder: Secondary | ICD-10-CM | POA: Diagnosis not present

## 2023-01-04 DIAGNOSIS — I2510 Atherosclerotic heart disease of native coronary artery without angina pectoris: Secondary | ICD-10-CM | POA: Diagnosis not present

## 2023-01-04 DIAGNOSIS — E039 Hypothyroidism, unspecified: Secondary | ICD-10-CM | POA: Diagnosis not present

## 2023-01-04 DIAGNOSIS — D696 Thrombocytopenia, unspecified: Secondary | ICD-10-CM | POA: Diagnosis not present

## 2023-01-04 DIAGNOSIS — E782 Mixed hyperlipidemia: Secondary | ICD-10-CM | POA: Diagnosis not present

## 2023-01-04 DIAGNOSIS — I739 Peripheral vascular disease, unspecified: Secondary | ICD-10-CM | POA: Diagnosis not present

## 2023-01-04 DIAGNOSIS — I350 Nonrheumatic aortic (valve) stenosis: Secondary | ICD-10-CM | POA: Diagnosis not present

## 2023-01-08 ENCOUNTER — Ambulatory Visit: Payer: Medicare HMO | Attending: Cardiovascular Disease | Admitting: Cardiovascular Disease

## 2023-01-08 DIAGNOSIS — I350 Nonrheumatic aortic (valve) stenosis: Secondary | ICD-10-CM

## 2023-01-08 NOTE — Assessment & Plan Note (Deleted)
 The patient has severe, stage D1 aortic stenosis.  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  I have personally reviewed the patient's CTA imaging.  While she has suitable cardiac anatomy for TAVR, her peripheral access is very marginal.  She has heavily calcified iliac arteries and by my measurements they are not large enough for insertion of a TAVR valve via transfemoral access.  She has dense 360 degree calcification but I do not think will allow for access even with shockwave lithotripsy intervention.  Her left subclavian is stented with lumen size too small for TAVR valve insertion.  I think her access will be limited to transcarotid.

## 2023-01-08 NOTE — Progress Notes (Signed)
 Pt no show for appointment.

## 2023-01-09 ENCOUNTER — Encounter: Payer: Self-pay | Admitting: Cardiovascular Disease

## 2023-01-09 NOTE — Progress Notes (Signed)
[facility]   MULTIDISCIPLINARY HEART VALVE CLINIC                                     Cardiology Office Note:    Date:  01/22/2023   ID:  Dawn Boyer, DOB 1945/10/29, MRN [id number]  PCP:  [name], MD  [facility] Cardiologist:  [name], MD  [facility] Electrophysiologist:  None   Referring MD: [name], MD   Chief Complaint  Patient presents with   Shortness of Breath    History of Present Illness:    Dawn Boyer is a 78 y.o. female presenting for follow-up of aortic stenosis.  She has a history of remote CABG in 1994 as well as peripheral arterial disease with left subclavian stenting in 2019.  She has also undergone right iliac stenting and SVG PCI.  I saw her for initial consultation in September 2024.  At the time, she did not feel ready to proceed with TAVR and she presents today for follow-up evaluation to determine if she is ready to move forward with further evaluation and treatment.  She has undergone CT angiography studies which demonstrated a calcified trileaflet aortic valve with Agatston score of 3070.  The patient's annular area is 450 mm suitable for a 26 mm SAPIEN 3 valve.  The patient's peripheral CTA of the chest, abdomen, and pelvis demonstrated poor vascular access for TAVR.  She had some incidental findings with a soft tissue density filling the distal common bile duct which could represent a stone or neoplasm and a follow-up MRCP was recommended.  There was also a subpleural nodule in the left lower lobe with a follow-up CT recommended at 6 to 12 months.  Findings consistent with interstitial lung disease were identified.  And the patient also had a age indeterminant severe compression deformity of T8 and mild compression deformities of L1 and L3.  The patient is here with her daughter today. She continues to have shortness of breath with activity and generalized fatigue.  She denies cough, orthopnea, PND, or leg swelling.  She  has had no recent problems with chest pain.  We discussed all of the incidental findings from her TAVR CT scans and she has not inclined to move forward with follow-up imaging.  She reports no recent change in her cardiovascular symptoms.  Past Medical History:  Diagnosis Date   Anemia    hx   Anxiety    Arthritis    Carotid arterial disease (HCC)    a. 04/2015 Carotid U/S: RICA 1-39%, LICA 40-59%, no change.   Coronary artery disease    a. 1994 s/p CABG;  b. 03/2012 MV: No ischemia/infarct. DES SVG-Diag 2019   Depression    Diverticulosis    Elevated LFTs    Fibrocystic breast    GERD (gastroesophageal reflux disease)    Hemorrhoids    Hyperlipidemia    Hypertension    Hypothyroidism    Insomnia    Nephrolithiasis    PONV (postoperative nausea and vomiting)    after heart surgery     Current Medications: Current Meds  Medication Sig   ALPRAZolam (XANAX) 1 MG tablet Take 1 mg by mouth at bedtime.    amLODipine (NORVASC) 5 MG tablet Take 1 tablet (5 mg total) by mouth daily.   b complex vitamins capsule Take 1 capsule by mouth daily.   cholecalciferol (VITAMIN D3)  25 MCG (1000 UNIT) tablet Take 5,000 Units by mouth daily. W Vit K ( K2 and D3)   clopidogrel (PLAVIX) 75 MG tablet TAKE 1 TABLET EVERY DAY   co-enzyme Q-10 30 MG capsule Take 30 mg by mouth 2 (two) times daily.   ezetimibe (ZETIA) 10 MG tablet TAKE 1 TABLET EVERY DAY   isosorbide mononitrate (IMDUR) 30 MG 24 hr tablet Take 1/2 tablet (15 mg total) by mouth daily.   levothyroxine (SYNTHROID, LEVOTHROID) 50 MCG tablet Take 50 mcg by mouth daily before breakfast.    linaclotide (LINZESS) 290 MCG CAPS capsule Take 290 mcg by mouth daily as needed (constipation).   metoprolol succinate (TOPROL-XL) 50 MG 24 hr tablet Take 1 tablet (50 mg total) by mouth 2 (two) times daily. Take with or immediately following a meal.   Multiple Vitamins-Minerals (ZINC PO) Take by mouth daily in the afternoon.   nitroGLYCERIN (NITROSTAT)  0.4 MG SL tablet Place 1 tablet (0.4 mg total) under the tongue every 5 (five) minutes as needed for chest pain. PLACE ONE TABLET UNDER THE TONGUE EVERY 5 MINUTES FOR THREE DOSES AS NEEDED FOR CHEST PAIN   pantoprazole (PROTONIX) 40 MG tablet Take 40 mg by mouth at bedtime.   PRESCRIPTION MEDICATION PATIENT DOES NOT TAKE ANY VACCINES   sertraline (ZOLOFT) 100 MG tablet Take 50 mg by mouth daily.   Sodium Phosphates (ENEMA RE) Place 1 each rectally 2 (two) times daily. May use an additional 2 times as needed for constipation   vitamin C (ASCORBIC ACID) 500 MG tablet Take 500 mg by mouth daily.      ROS:   Please see the history of present illness.    All other systems reviewed and are negative.  EKGs   EKG Interpretation Date/Time:  Thursday January 18 2023 16:01:11 EST Ventricular Rate:  68 PR Interval:  164 QRS Duration:  96 QT Interval:  396 QTC Calculation: 421 R Axis:   103  Text Interpretation: Normal sinus rhythm Rightward axis ST & T wave abnormality, consider inferior ischemia ST & T wave abnormality, consider anterolateral ischemia When compared with ECG of 31-Aug-2022 09:09, Questionable change in QRS axis T wave inversion more evident in Inferior leads Confirmed by Tonny Bollman [phone]) on 01/18/2023 4:14:16 PM        Physical Exam:    VS:  BP 110/72   Pulse 68   Ht 5\' 4"  (1.626 m)   Wt 118 lb 3.2 oz (53.6 kg)   SpO2 95%   BMI 20.29 kg/m     Wt Readings from Last 3 Encounters:  01/18/23 118 lb 3.2 oz (53.6 kg)  09/18/22 115 lb 3.2 oz (52.3 kg)  08/31/22 111 lb 2 oz (50.4 kg)     GEN: Well nourished, well developed in no acute distress NECK: No JVD CARDIAC: RRR, 3/6 harsh late peaking systolic murmur at the right upper sternal border, absent A2, no diastolic murmur RESPIRATORY:  Clear to auscultation without rales, wheezing or rhonchi  ABDOMEN: Soft, non-tender, non-distended EXTREMITIES:  No edema; No deformity.    ASSESSMENT:    1. Severe aortic  stenosis   2. Essential hypertension, benign   3. Shortness of breath     PLAN:    In order of problems listed above:  The patient has severe, stage D1 aortic stenosis.  As outlined above, she has multiple comorbid conditions with previous CABG with history of saphenous vein graft occlusion and saphenous vein graft PCI, peripheral arterial disease with iliac  and subclavian stenting, and incidental chest CT findings suggestive of interstitial lung disease/UIP.  I have personally reviewed the patient's CTA imaging.  While she has suitable cardiac anatomy for TAVR, her peripheral access is very marginal.  She has heavily calcified iliac arteries and by my measurements they are not large enough for insertion of a TAVR valve via transfemoral access.  She has dense 360 degree calcification but I do not think will allow for access even with shockwave lithotripsy intervention.  Her left subclavian is stented with lumen size too small for TAVR valve insertion.  I think her access will be limited to transcarotid.  Because of the findings of interstitial lung disease on her CT scan, I recommended that she undergo formal PFTs.  Her lung exam is pretty benign and she does not require oxygen.  It seems appropriate to move forward with TAVR to prevent progressive symptoms of heart failure related to her aortic stenosis.  The patient would like to proceed.  We have discussed potential risks, indications, and alternatives.  She understands the risk of serious complications such as stroke, myocardial infarction, major vascular injury, major bleeding, hemopericardium with need for emergent pericardiocentesis, valve embolization, aortic injury, annular rupture, cardiac perforation, need for emergency surgery, and death, occur with a combined serious risk in the range of 2 to 3%.  She understands the risk of permanent pacemaker is approximately 6 to 8%.  Once her PFTs are completed, she will be referred for formal cardiac  surgical consultation as part of a multidisciplinary approach to her care with tentative plans for TAVR via left carotid access if she is felt to be an appropriate candidate.             Medication Adjustments/Labs and Tests Ordered: Current medicines are reviewed at length with the patient today.  Concerns regarding medicines are outlined above.  Orders Placed This Encounter  Procedures   EKG 12-Lead   Pulmonary Function Test   No orders of the defined types were placed in this encounter.   Patient Instructions  Testing/Procedures: Pulmonary function testing (PFT's) Your physician has recommended that you have a pulmonary function test. Pulmonary Function Tests are a group of tests that measure how well air moves in and out of your lungs.  Surgical evaluation (Dr Beckie Salts will be called to schedule  Follow-Up: At [facility], you and your health needs are our priority.  As part of our continuing mission to provide you with exceptional heart care, we have created designated Provider Care Teams.  These Care Teams include your primary Cardiologist (physician) and Advanced Practice Providers (APPs -  Physician Assistants and Nurse Practitioners) who all work together to provide you with the care you need, when you need it.  Your next appointment:   Structural Team will follow-up  Provider:   Tonny Bollman, MD         Signed, Tonny Bollman, MD  01/22/2023 9:03 AM    [facility]

## 2023-01-18 ENCOUNTER — Ambulatory Visit: Payer: Medicare HMO | Attending: Cardiovascular Disease | Admitting: Cardiovascular Disease

## 2023-01-18 ENCOUNTER — Encounter: Payer: Self-pay | Admitting: Cardiovascular Disease

## 2023-01-18 VITALS — BP 110/72 | HR 68 | Ht 64.0 in | Wt 118.2 lb

## 2023-01-18 DIAGNOSIS — I10 Essential (primary) hypertension: Secondary | ICD-10-CM

## 2023-01-18 DIAGNOSIS — R0602 Shortness of breath: Secondary | ICD-10-CM | POA: Diagnosis not present

## 2023-01-18 DIAGNOSIS — I350 Nonrheumatic aortic (valve) stenosis: Secondary | ICD-10-CM

## 2023-01-18 DIAGNOSIS — I2510 Atherosclerotic heart disease of native coronary artery without angina pectoris: Secondary | ICD-10-CM | POA: Diagnosis not present

## 2023-01-18 DIAGNOSIS — E782 Mixed hyperlipidemia: Secondary | ICD-10-CM

## 2023-01-18 NOTE — Patient Instructions (Signed)
Testing/Procedures: Pulmonary function testing (PFT's) Your physician has recommended that you have a pulmonary function test. Pulmonary Function Tests are a group of tests that measure how well air moves in and out of your lungs.  Surgical evaluation (Dr Beckie Salts will be called to schedule  Follow-Up: At [facility], you and your health needs are our priority.  As part of our continuing mission to provide you with exceptional heart care, we have created designated Provider Care Teams.  These Care Teams include your primary Cardiologist (physician) and Advanced Practice Providers (APPs -  Physician Assistants and Nurse Practitioners) who all work together to provide you with the care you need, when you need it.  Your next appointment:   Structural Team will follow-up  Provider:   Tonny Bollman, MD

## 2023-01-18 NOTE — Progress Notes (Signed)
Pre Surgical Assessment: 5 M Walk Test  35M=16.84ft  5 Meter Walk Test- trial 1: 3.51 seconds 5 Meter Walk Test- trial 2: 3.64 seconds 5 Meter Walk Test- trial 3: 3.71 seconds 5 Meter Walk Test Average: 3.62 seconds

## 2023-01-19 ENCOUNTER — Other Ambulatory Visit: Payer: Self-pay | Admitting: Physician Assistant

## 2023-01-22 ENCOUNTER — Encounter: Payer: Self-pay | Admitting: Cardiovascular Disease

## 2023-02-02 ENCOUNTER — Encounter (HOSPITAL_COMMUNITY): Payer: Medicare HMO

## 2023-02-20 ENCOUNTER — Ambulatory Visit (INDEPENDENT_AMBULATORY_CARE_PROVIDER_SITE_OTHER): Payer: Medicare HMO

## 2023-02-20 ENCOUNTER — Ambulatory Visit: Payer: Medicare HMO | Attending: Cardiovascular Disease

## 2023-02-20 DIAGNOSIS — I739 Peripheral vascular disease, unspecified: Secondary | ICD-10-CM | POA: Diagnosis not present

## 2023-02-20 DIAGNOSIS — I6523 Occlusion and stenosis of bilateral carotid arteries: Secondary | ICD-10-CM

## 2023-02-20 DIAGNOSIS — Z95828 Presence of other vascular implants and grafts: Secondary | ICD-10-CM | POA: Diagnosis not present

## 2023-02-22 ENCOUNTER — Other Ambulatory Visit: Payer: Self-pay | Admitting: *Deleted

## 2023-02-22 DIAGNOSIS — I6523 Occlusion and stenosis of bilateral carotid arteries: Secondary | ICD-10-CM

## 2023-02-22 DIAGNOSIS — I771 Stricture of artery: Secondary | ICD-10-CM

## 2023-02-22 DIAGNOSIS — I739 Peripheral vascular disease, unspecified: Secondary | ICD-10-CM

## 2023-02-22 LAB — VAS US ABI WITH/WO TBI
Left ABI: 1
Right ABI: 0.97

## 2023-02-26 ENCOUNTER — Ambulatory Visit (HOSPITAL_COMMUNITY)
Admission: RE | Admit: 2023-02-26 | Discharge: 2023-02-26 | Disposition: A | Discharge status: Home / Self Care | Payer: Medicare HMO | Source: Ambulatory Visit | Attending: Cardiovascular Disease | Admitting: Cardiovascular Disease

## 2023-02-26 DIAGNOSIS — R0602 Shortness of breath: Secondary | ICD-10-CM | POA: Diagnosis not present

## 2023-02-26 LAB — PULMONARY FUNCTION TEST
FEF 25-75 Pre: 1.64 L/s
FEF2575-%Pred-Pre: 109 %
FEV1-%Pred-Pre: 104 %
FEV1-Pre: 1.98 L
FEV1FVC-%Pred-Pre: 101 %
FEV6-%Pred-Pre: 108 %
FEV6-Pre: 2.6 L
FEV6FVC-%Pred-Pre: 105 %
FVC-%Pred-Pre: 102 %
FVC-Pre: 2.6 L
Pre FEV1/FVC ratio: 76 %
Pre FEV6/FVC Ratio: 100 %
RV % pred: 52 %
RV: 1.17 L
TLC % pred: 81 %
TLC: 3.88 L

## 2023-02-28 ENCOUNTER — Encounter: Payer: Self-pay | Admitting: *Deleted

## 2023-02-28 ENCOUNTER — Institutional Professional Consult (permissible substitution): Payer: Medicare HMO | Admitting: Surgery

## 2023-02-28 VITALS — BP 110/59 | HR 80 | Resp 18 | Ht 64.0 in | Wt 116.0 lb

## 2023-02-28 DIAGNOSIS — I350 Nonrheumatic aortic (valve) stenosis: Secondary | ICD-10-CM | POA: Diagnosis not present

## 2023-03-02 ENCOUNTER — Encounter: Payer: Self-pay | Admitting: Surgery

## 2023-03-02 NOTE — Progress Notes (Signed)
 Patient ID: Dawn Boyer, female   DOB: Mar 28, 1945, 78 y.o.   MRN: [id number]  [facility]       [address]       [name] [address]             [phone]          CARDIOTHORACIC SURGERY CONSULTATION REPORT  PCP is [name], MD Referring Provider is Tonny Bollman, MD Primary Cardiologist is [name], MD  Reason for consultation: Severe aortic stenosis  HPI:  The patient is a 78 year old woman with a history of hypertension, hyperlipidemia, hypothyroidism, coronary artery disease status post CABG by me in 1994 and saphenous vein graft stenting in 2019, peripheral arterial disease status post left subclavian artery stenting in 2019 and history of right iliac artery stenting, and severe aortic stenosis who was referred for consideration of TAVR.  Her last echo in August 2024 showed a trileaflet aortic valve with severe calcification and thickening and leaflet restriction.  The mean gradient was 38 mmHg with a peak of 62 mmHg.  Dimensionless index was 0.23 with a valve area of 0.8 cm.  There is mild aortic insufficiency.  Left ventricular ejection fraction was 55 to 60% with grade 1 diastolic dysfunction and mild LVH.  She had a cardiac CTA on 10/02/2022 showing an aortic valve calcium score of 3070.  A patent LIMA to the LAD and patent saphenous vein graft to the obtuse marginal.  She was seen by Dr. Excell Seltzer in September 2024 and was having some progression of her symptoms of discomfort in her neck and jaw as well as shortness of breath with physical activity.  Workup for TAVR was recommended but she did not feel that she was ready to move forward with TAVR at that time.  She was seen again in January 2025 and reported continued shortness of breath with activity and generalized fatigue.  She is here today with her daughter.  She said that her main symptom is tightness and discomfort in her neck and jaw with  exertion.  She has also had some exertional shortness of breath and fatigue.  She denies any dizziness or syncope.  She has had no orthopnea or PND.  She denies peripheral edema.  Past Medical History:  Diagnosis Date   Anemia    hx   Anxiety    Arthritis    Carotid arterial disease (HCC)    a. 04/2015 Carotid U/S: RICA 1-39%, LICA 40-59%, no change.   Coronary artery disease    a. 1994 s/p CABG;  b. 03/2012 MV: No ischemia/infarct. DES SVG-Diag 2019   Depression    Diverticulosis    Elevated LFTs    Fibrocystic breast    GERD (gastroesophageal reflux disease)    Hemorrhoids    Hyperlipidemia    Hypertension    Hypothyroidism    Insomnia    Nephrolithiasis    PONV (postoperative nausea and vomiting)    after heart surgery    Past Surgical History:  Procedure Laterality Date   ABDOMINAL AORTOGRAM W/LOWER EXTREMITY N/A 10/03/2017   Procedure: ABDOMINAL AORTOGRAM W/LOWER EXTREMITY;  Surgeon: Iran Ouch, MD;  Location: [facility];  Service: Cardiovascular;  Laterality: N/A;  Bilateral Limited   AORTIC ARCH ANGIOGRAPHY N/A 07/25/2017   Procedure: AORTIC ARCH ANGIOGRAPHY;  Surgeon: Iran Ouch, MD;  Location: [facility];  Service: Cardiovascular;  Laterality: N/A;   CARDIAC CATHETERIZATION  CHOLECYSTECTOMY  2002   CORONARY ARTERY BYPASS GRAFT  1993   CORONARY STENT INTERVENTION N/A 04/03/2017   Procedure: CORONARY STENT INTERVENTION;  Surgeon: Corky Crafts, MD;  Location: [facility] INVASIVE CV LAB;  Service: Cardiovascular;  Laterality: N/A;   EVALUATION UNDER ANESTHESIA WITH HEMORRHOIDECTOMY N/A 03/14/2012   Procedure: EXAM UNDER ANESTHESIA WITH Excisional HEMORRHOIDECTOMY and Hemorrhoidal banding;  Surgeon: Atilano Ina, MD;  Location: [facility];  Service: General;  Laterality: N/A;  x 2 hemorrhoids   HEMORRHOID SURGERY     LEFT HEART CATH AND CORS/GRAFTS ANGIOGRAPHY N/A 04/03/2017   Procedure: LEFT HEART CATH AND CORS/GRAFTS ANGIOGRAPHY;  Surgeon: Corky Crafts, MD;  Location: [facility];  Service: Cardiovascular;  Laterality: N/A;   PERIPHERAL VASCULAR INTERVENTION Left 07/25/2017   Procedure: PERIPHERAL VASCULAR INTERVENTION;  Surgeon: Iran Ouch, MD;  Location: [facility];  Service: Cardiovascular;  Laterality: Left;  Subclavian   PERIPHERAL VASCULAR INTERVENTION  10/03/2017   Procedure: PERIPHERAL VASCULAR INTERVENTION;  Surgeon: Iran Ouch, MD;  Location: [facility];  Service: Cardiovascular;;  LCIA   RIGHT/LEFT HEART CATH AND CORONARY/GRAFT ANGIOGRAPHY N/A 06/13/2021   Procedure: RIGHT/LEFT HEART CATH AND CORONARY/GRAFT ANGIOGRAPHY;  Surgeon: Corky Crafts, MD;  Location: [facility] INVASIVE CV LAB;  Service: Cardiovascular;  Laterality: N/A;   TUBAL LIGATION  1970   ULTRASOUND GUIDANCE FOR VASCULAR ACCESS  04/03/2017   Procedure: Ultrasound Guidance For Vascular Access;  Surgeon: Corky Crafts, MD;  Location: [facility] INVASIVE CV LAB;  Service: Cardiovascular;;   UPPER EXTREMITY ANGIOGRAPHY Left 07/25/2017   Procedure: UPPER EXTREMITY ANGIOGRAPHY;  Surgeon: Iran Ouch, MD;  Location: [facility];  Service: Cardiovascular;  Laterality: Left;    Family History  Problem Relation Age of Onset   Hypertension Sister    Hyperlipidemia Sister    Heart disease Brother    Stroke Sister    Heart Problems Son        Pt states son had a "skipped heart beats"   Diabetes Daughter    Colon cancer Neg Hx    Esophageal cancer Neg Hx    Rectal cancer Neg Hx    Stomach cancer Neg Hx     Social History   Socioeconomic History   Marital status: Married    Spouse name: Not on file   Number of children: Not on file   Years of education: Not on file   Highest education level: Not on file  Occupational History   Not on file  Tobacco Use   Smoking status: Former    Current packs/day: 0.00    Average packs/day: 1 pack/day for 12.0 years (12.0 ttl pk-yrs)    Types: Cigarettes    Start date:  11/21/1970    Quit date: 11/21/1982    Years since quitting: 40.3   Smokeless tobacco: Never  Vaping Use   Vaping status: Never Used  Substance and Sexual Activity   Alcohol use: No    Alcohol/week: 0.0 standard drinks of alcohol   Drug use: No   Sexual activity: Not on file  Other Topics Concern   Not on file  Social History Narrative   Not on file   Social Drivers of Health   Financial Resource Strain: Not on file  Food Insecurity: Not on file  Transportation Needs: Not on file  Physical Activity: Not on file  Stress: Not on file  Social Connections: Not on file  Intimate Partner Violence: Not on file  Prior to Admission medications   Medication Sig Start Date End Date Taking? Authorizing Provider  ALPRAZolam Prudy Feeler) 1 MG tablet Take 1 mg by mouth at bedtime.  12/09/13  Yes [provider]  amLODipine (NORVASC) 5 MG tablet Take 1 tablet (5 mg total) by mouth daily. 01/20/22  Yes Iran Ouch, MD  b complex vitamins capsule Take 1 capsule by mouth daily.   Yes [provider]  cholecalciferol (VITAMIN D3) 25 MCG (1000 UNIT) tablet Take 5,000 Units by mouth daily. W Vit K ( K2 and D3)   Yes [provider]  clopidogrel (PLAVIX) 75 MG tablet TAKE 1 TABLET EVERY DAY 11/27/22  Yes Iran Ouch, MD  co-enzyme Q-10 30 MG capsule Take 30 mg by mouth 2 (two) times daily.   Yes [provider]  ezetimibe (ZETIA) 10 MG tablet TAKE 1 TABLET EVERY DAY 11/27/22  Yes Iran Ouch, MD  isosorbide mononitrate (IMDUR) 30 MG 24 hr tablet Take 1/2 tablet (15 mg total) by mouth daily. 06/15/21  Yes Jonita Albee, PA-C  linaclotide (LINZESS) 290 MCG CAPS capsule Take 290 mcg by mouth daily as needed (constipation).   Yes [provider]  metoprolol succinate (TOPROL-XL) 50 MG 24 hr tablet Take 1 tablet (50 mg total) by mouth 2 (two) times daily. Take with or immediately following a meal. 06/14/21  Yes Jonita Albee, PA-C   Multiple Vitamins-Minerals (ZINC PO) Take by mouth daily in the afternoon.   Yes [provider]  nitroGLYCERIN (NITROSTAT) 0.4 MG SL tablet Place 1 tablet (0.4 mg total) under the tongue every 5 (five) minutes as needed for chest pain. PLACE ONE TABLET UNDER THE TONGUE EVERY 5 MINUTES FOR THREE DOSES AS NEEDED FOR CHEST PAIN 06/28/21  Yes Furth, Cadence H, PA-C  pantoprazole (PROTONIX) 40 MG tablet Take 40 mg by mouth at bedtime.   Yes [provider]  PRESCRIPTION MEDICATION PATIENT DOES NOT TAKE ANY VACCINES   Yes [provider]  sertraline (ZOLOFT) 100 MG tablet Take 50 mg by mouth daily. 01/19/17  Yes [provider]  Sodium Phosphates (ENEMA RE) Place 1 each rectally 2 (two) times daily. May use an additional 2 times as needed for constipation   Yes [provider]  vitamin C (ASCORBIC ACID) 500 MG tablet Take 500 mg by mouth daily.   Yes [provider]    Current Outpatient Medications  Medication Sig Dispense Refill   ALPRAZolam (XANAX) 1 MG tablet Take 1 mg by mouth at bedtime.      amLODipine (NORVASC) 5 MG tablet Take 1 tablet (5 mg total) by mouth daily. 90 tablet 3   b complex vitamins capsule Take 1 capsule by mouth daily.     cholecalciferol (VITAMIN D3) 25 MCG (1000 UNIT) tablet Take 5,000 Units by mouth daily. W Vit K ( K2 and D3)     clopidogrel (PLAVIX) 75 MG tablet TAKE 1 TABLET EVERY DAY 90 tablet 3   co-enzyme Q-10 30 MG capsule Take 30 mg by mouth 2 (two) times daily.     ezetimibe (ZETIA) 10 MG tablet TAKE 1 TABLET EVERY DAY 90 tablet 3   isosorbide mononitrate (IMDUR) 30 MG 24 hr tablet Take 1/2 tablet (15 mg total) by mouth daily. 90 tablet 3   linaclotide (LINZESS) 290 MCG CAPS capsule Take 290 mcg by mouth daily as needed (constipation).     metoprolol succinate (TOPROL-XL) 50 MG 24 hr tablet Take 1 tablet (50 mg  total) by mouth 2 (two) times daily. Take with or immediately following a meal. 180 tablet 3    Multiple Vitamins-Minerals (ZINC PO) Take by mouth daily in the afternoon.     nitroGLYCERIN (NITROSTAT) 0.4 MG SL tablet Place 1 tablet (0.4 mg total) under the tongue every 5 (five) minutes as needed for chest pain. PLACE ONE TABLET UNDER THE TONGUE EVERY 5 MINUTES FOR THREE DOSES AS NEEDED FOR CHEST PAIN 25 tablet 3   pantoprazole (PROTONIX) 40 MG tablet Take 40 mg by mouth at bedtime.     PRESCRIPTION MEDICATION PATIENT DOES NOT TAKE ANY VACCINES     sertraline (ZOLOFT) 100 MG tablet Take 50 mg by mouth daily.     Sodium Phosphates (ENEMA RE) Place 1 each rectally 2 (two) times daily. May use an additional 2 times as needed for constipation     vitamin C (ASCORBIC ACID) 500 MG tablet Take 500 mg by mouth daily.     No current facility-administered medications for this visit.    No Known Allergies    Review of Systems:   General:  Normal appetite, + decreased energy, no weight gain, no weight loss, no fever  Cardiac:  no chest pain with exertion, no chest pain at rest, +SOB with moderate exertion, no resting SOB, no PND, no orthopnea, no palpitations, no arrhythmia, no atrial fibrillation, no LE edema, no dizzy spells, no syncope  Respiratory:  + exertioal shortness of breath, no home oxygen, no productive cough, + dry cough, no bronchitis, no wheezing, no hemoptysis, no asthma, no pain with inspiration or cough, no sleep apnea, no CPAP at night  GI:   no difficulty swallowing, no reflux, no frequent heartburn, no hiatal hernia, no abdominal pain, no constipation, no diarrhea, no hematochezia, no hematemesis, no melena  GU:   no dysuria,  no frequency, no urinary tract infection, no hematuria, no kidney stones, no kidney disease  Vascular:  no pain suggestive of claudication, no pain in feet, no leg cramps, no varicose veins, no DVT, no non-healing foot ulcer  Neuro:   no stroke, no TIA's, no seizures, no headaches, no temporary blindness one eye,  no slurred speech, no peripheral  neuropathy, no chronic pain, no instability of gait, no memory/cognitive dysfunction  Musculoskeletal: no arthritis, no joint swelling, no myalgias, no difficulty walking, normal mobility   Skin:   no rash, no itching, no skin infections, no pressure sores or ulcerations  Psych:   + anxiety, + depression, + nervousness, no unusual recent stress  Eyes:   no blurry vision, no floaters, no recent vision changes, no  glasses or contacts  ENT:   no hearing loss, no loose or painful teeth, + dentures  Hematologic:  + easy bruising, no abnormal bleeding, no clotting disorder, no frequent epistaxis  Endocrine:  no diabetes, does not check CBG's at home     Physical Exam:   BP (!) 110/59   Pulse 80   Resp 18   Ht 5\' 4"  (1.626 m)   Wt 116 lb (52.6 kg)   SpO2 95% Comment: RA  BMI 19.91 kg/m   General:  Thin woman in no distress  HEENT:  Unremarkable, NCAT, PERLA, EOMI  Neck:   no JVD, no bruits, no adenopathy   Chest:   clear to auscultation, symmetrical breath sounds, no wheezes, no rhonchi   CV:   RRR, 3/6 systolic murmur RSB, no diastolic murmur  Abdomen:  soft, non-tender, no masses   Extremities:  warm,  well-perfused, pedal pulses palpable, no lower extremity edema  Rectal/GU  Deferred  Neuro:   Grossly non-focal and symmetrical throughout  Skin:   Clean and dry, no rashes, no breakdown  Diagnostic Tests:  ECHOCARDIOGRAM REPORT       Patient Name:   RAYLEA ADCOX [place] Date of Exam: 08/25/2022  Medical Rec #:  [id number]    Height:       60.0 in  Accession #:    [id number]   Weight:       118.1 lb  Date of Birth:  06/06/1945     BSA:          1.492 m  Patient Age:    77 years     BP:           100/60 mmHg  Patient Gender: F            HR:           70 bpm.  Exam Location:  [address]   Procedure: 2D Echo, Cardiac Doppler and Color Doppler   Indications:    I35.0 Nonrheumatic aortic (valve) stenosis    History:        Patient has prior history of Echocardiogram examinations,  most                  recent 06/12/2021. CAD, Prior CABG, Carotid Disease, Aortic  Valve                 Disease, Signs/Symptoms:Shortness of Breath; Risk                  Factors:Hypertension, Dyslipidemia and Former Smoker.    Sonographer:    Quentin Ore RDMS, RVT, RDCS  Referring Phys: [id number] MUHAMMAD A ARIDA   IMPRESSIONS     1. Left ventricular ejection fraction, by estimation, is 55 to 60%. The  left ventricle has normal function. The left ventricle has no regional  wall motion abnormalities. There is mild left ventricular hypertrophy.  Left ventricular diastolic parameters  are consistent with Grade I diastolic dysfunction (impaired relaxation).   2. Right ventricular systolic function is normal. The right ventricular  size is normal.   3. Left atrial size was moderately dilated.   4. The mitral valve is normal in structure. Mild mitral valve  regurgitation.   5. Mean aortic valve gradient , Peak G , Vmax 3.95 m/s, DVI=  0.23, AVA 0.8cm2.. The aortic valve is tricuspid. Aortic valve  regurgitation is mild. Severe aortic valve stenosis.   6. The inferior vena cava is normal in size with greater than 50%  respiratory variability, suggesting right atrial pressure of 3 mmHg.   FINDINGS   Left Ventricle: Left ventricular ejection fraction, by estimation, is 55  to 60%. The left ventricle has normal function. The left ventricle has no  regional wall motion abnormalities. The left ventricular internal cavity  size was normal in size. There is   mild left ventricular hypertrophy. Left ventricular diastolic parameters  are consistent with Grade I diastolic dysfunction (impaired relaxation).   Right Ventricle: The right ventricular size is normal. No increase in  right ventricular wall thickness. Right ventricular systolic function is  normal.   Left Atrium: Left atrial size was moderately dilated.   Right Atrium: Right atrial size was normal in size.   Pericardium: There is  no evidence of pericardial effusion.   Mitral Valve: The mitral valve is normal in structure. Mild mitral valve  regurgitation.   Tricuspid Valve:  The tricuspid valve is normal in structure. Tricuspid  valve regurgitation is mild.   Aortic Valve: Mean aortic valve gradient , Peak G , Vmax 3.95  m/s, DVI= 0.23, AVA 0.8cm2. The aortic valve is tricuspid. Aortic valve  regurgitation is mild. Severe aortic stenosis is present. Aortic valve  mean gradient measures 31.8 mmHg.  Aortic valve peak gradient measures 59.4 mmHg. Aortic valve area, by VTI  measures 0.83 cm.   Pulmonic Valve: The pulmonic valve was normal in structure. Pulmonic valve  regurgitation is mild to moderate.   Aorta: The aortic root is normal in size and structure.   Venous: The inferior vena cava is normal in size with greater than 50%  respiratory variability, suggesting right atrial pressure of 3 mmHg.   IAS/Shunts: No atrial level shunt detected by color flow Doppler.     LEFT VENTRICLE  PLAX 2D  LVIDd:         4.50 cm     Diastology  LVIDs:         3.10 cm     LV e' medial:    4.24 cm/s  LV PW:         1.10 cm     LV E/e' medial:  14.7  LV IVS:        1.30 cm     LV e' lateral:   6.64 cm/s  LVOT diam:     2.10 cm     LV E/e' lateral: 9.4  LV SV:         75  LV SV Index:   50  LVOT Area:     3.46 cm    LV Volumes (MOD)  LV vol d, MOD A2C: 73.7 ml  LV vol d, MOD A4C: 76.0 ml  LV vol s, MOD A2C: 35.8 ml  LV vol s, MOD A4C: 35.6 ml  LV SV MOD A2C:     37.9 ml  LV SV MOD A4C:     76.0 ml  LV SV MOD BP:      38.3 ml   RIGHT VENTRICLE             IVC  RV S prime:     12.10 cm/s  IVC diam: 1.30 cm  TAPSE (M-mode): 1.6 cm   LEFT ATRIUM             Index        RIGHT ATRIUM           Index  LA diam:        4.40 cm 2.95 cm/m   RA Area:     14.40 cm  LA Vol (A2C):   55.8 ml 37.39 ml/m  RA Volume:   36.30 ml  24.32 ml/m  LA Vol (A4C):   62.7 ml 42.02 ml/m  LA Biplane Vol: 60.3 ml 40.41  ml/m   AORTIC VALVE                     PULMONIC VALVE  AV Area (Vmax):    0.86 cm      PV Vmax:          1.01 m/s  AV Area (Vmean):   0.80 cm      PV Peak grad:     4.1 mmHg  AV Area (VTI):     0.83 cm      PR End Diast Vel: 8.41 msec  AV Vmax:  385.33 cm/s  AV Vmean:          286.500 cm/s  AV VTI:            0.906 m  AV Peak Grad:      59.4 mmHg  AV Mean Grad:      31.8 mmHg  LVOT Vmax:         95.40 cm/s  LVOT Vmean:        65.867 cm/s  LVOT VTI:          0.217 m  LVOT/AV VTI ratio: 0.24    AORTA  Ao Root diam: 2.70 cm  Ao Asc diam:  3.50 cm  Ao Arch diam: 2.3 cm   MITRAL VALVE               TRICUSPID VALVE  MV Area (PHT): 3.08 cm    TR Peak grad:   32.9 mmHg  MV Decel Time: 246 msec    TR Vmax:        287.00 cm/s  MV E velocity: 62.40 cm/s  MV A velocity: 98.00 cm/s  SHUNTS  MV E/A ratio:  0.64        Systemic VTI:  0.22 m                             Systemic Diam: 2.10 cm   Debbe Odea MD  Electronically signed by Debbe Odea MD  Signature Date/Time: 08/25/2022/4:24:47 PM        Final      ADDENDUM REPORT: 10/02/2022 14:32   EXAM: OVER-READ INTERPRETATION  CT CHEST   The following report is an over-read performed by radiologist Dr. Jacob Moores [facility] on 10/02/2022. This over-read does not include interpretation of cardiac or coronary anatomy or pathology. The cardiac TAVR interpretation by the cardiologist is attached.   COMPARISON:  None.   FINDINGS: Extracardiac findings will be described separately under dictation for contemporaneously obtained CTA chest, abdomen and pelvis.   IMPRESSION: Please see separate dictation for contemporaneously obtained CTA chest, abdomen and pelvis dated 10/02/2022 for full description of relevant extracardiac findings.     Electronically Signed   By: Allegra Lai M.D.   On: 10/02/2022 14:32    Addended by Renford Dills, MD on 10/02/2022  2:35 PM    Study  Result  Narrative & Impression  CLINICAL DATA:  Aortic Stenosis   EXAM: Cardiac TAVR CT   TECHNIQUE: The patient was scanned on a Siemens Force 192 slice scanner. A 120 kV retrospective scan was triggered in the ascending thoracic aorta at 140 HU's. Gantry rotation speed was 250 msecs and collimation was .6 mm. No beta blockade or nitro were given. The 3D data set was reconstructed in 5% intervals of the R-R cycle. Systolic and diastolic phases were analyzed on a dedicated work station using MPR, MIP and VRT modes. The patient received 80 cc of contrast.   FINDINGS: Aortic Valve: Calcified tri leaflet AV with score 3070   Aorta:   Sino-tubular Junction: 23.2 mm   Ascending Thoracic Aorta: 33.4 mm   Aortic Arch: 25 mm   Descending Thoracic Aorta: 22 mm   Sinus of Valsalva Measurements:   Non-coronary: 26.4 mm  Height 21.6 mm   Right - coronary: 25.1 mm  Height 20.8 mm   Left -   coronary: 26 mm  Height 19.4 mm   Coronary Artery Height above Annulus:   Left  Main: 14.5 mm above annulus   Right Coronary: 14.3 mm above annulus   Virtual Basal Annulus Measurements:   Maximum / Minimum Diameter: 24.6 mm x 22 mm Average diameter 23.9 mm   Perimeter: 76.8 mm   Area: 450 mm2   Coronary Arteries: Sufficient height above annulus for deployment Patent SVG to OM and patent LIMA to LAD   Optimum Fluoroscopic Angle for Delivery: LAO 18 Caudal 9 degrees   IMPRESSION: 1.  Calcified tri leaflet AV with calcium score 3070   2. Annular area of 450 mm2 suitable for a 26 mm Sapien 3 valve Sinus diameter not suitable for a Medtronic Evolut   3. Coronary arteries sufficient height above annulus for deployment Patent LIMA to LAD and SVG to OM   4.  Bovine arch with patent stent in proximal left subclavian artery   5. Optimum angiographic angle for deployment LAO 18 Caudal 9 degrees   6.  Membranous septal length 6.5 mm   Charlton Haws   Electronically Signed: By: Charlton Haws M.D. On: 10/02/2022 13:26      Narrative & Impression  CLINICAL DATA:  Aortic valve replacement preop evaluation   EXAM: CT ANGIOGRAPHY CHEST, ABDOMEN AND PELVIS   TECHNIQUE: Multidetector CT imaging through the chest, abdomen and pelvis was performed using the standard protocol during bolus administration of intravenous contrast. Multiplanar reconstructed images and MIPs were obtained and reviewed to evaluate the vascular anatomy.   RADIATION DOSE REDUCTION: This exam was performed according to the departmental dose-optimization program which includes automated exposure control, adjustment of the mA and/or kV according to patient size and/or use of iterative reconstruction technique.   CONTRAST:  95mL OMNIPAQUE IOHEXOL 350 MG/ML SOLN   COMPARISON:  CT abdomen and pelvis dated March 19, 2020   FINDINGS: CTA CHEST FINDINGS   Cardiovascular: Normal heart size. No pericardial effusion. Normal caliber thoracic aorta with severe atherosclerotic disease. Aortic valve thickening and calcifications. Severe left main and three-vessel coronary artery calcifications status post CABG.   Mediastinum/Nodes: Esophagus and thyroid are unremarkable. No enlarged lymph nodes seen in the chest.   Lungs/Pleura: Central airways are patent. Lower lung subpleural predominant reticular and ground-glass opacities with associated traction bronchiectasis and bronchiolectasis. No consolidation, pleural effusion or pneumothorax. Solid subpleural nodule of the left lower lobe measuring 6 mm on series 5, image 75.   Musculoskeletal: Prior median sternotomy with intact sternal wires. Age-indeterminate severe compression deformity of T8. Unchanged severe compression deformity of T12.   CTA ABDOMEN AND PELVIS FINDINGS   Hepatobiliary: No focal liver abnormality is seen. Status post cholecystectomy. No biliary dilatation. Small soft tissue density filling defect of the distal common bile duct  measuring 4 mm on series 4, image 113.   Pancreas: Unremarkable. No pancreatic ductal dilatation or surrounding inflammatory changes.   Spleen: Normal in size without focal abnormality.   Adrenals/Urinary Tract: Bilateral adrenal glands are unremarkable. No hydronephrosis or nephrolithiasis. Nonobstructing right renal stone. Simple appearing cyst of the right kidney, no specific follow-up imaging is necessary. Bladder is unremarkable.   Stomach/Bowel: Stomach is within normal limits. Appendix appears normal. No evidence of bowel wall thickening, distention, or inflammatory changes.   Vascular/lymphatic: No enlarged lymph nodes seen in the abdomen or pelvis. Severe atherosclerotic disease of the abdominal aorta. Bilateral proximal internal iliac arteries are occluded with distal reconstitution of flow. Severe narrowing of the proximal SMA and right renal arteries.   Reproductive: Uterus and bilateral adnexa are unremarkable.   Other: No abdominal  wall hernia or abnormality. No abdominopelvic ascites.   Musculoskeletal: Unchanged moderate L2 compression deformity. New mild age-indeterminate L1 and L3 compression deformities when compared with March 19, 2020 prior.   VASCULAR MEASUREMENTS PERTINENT TO TAVR:   AORTA:   Minimal Aortic Diameter -  6.0 mm   Severity of Aortic Calcification-severe   RIGHT PELVIS:   Right Common Iliac Artery -   Minimal Diameter-2.5 mm   Tortuosity-mild   Calcification-severe   Right External Iliac Artery -   Minimal Diameter-3.8 mm   Tortuosity-mild   Calcification-moderate   Right Common Femoral Artery -   Minimal Diameter-7.2 mm   Tortuosity-mild   Calcification-moderate   LEFT PELVIS:   Left Common Iliac Artery -   Minimal Diameter-3.1 mm   Tortuosity-mild   Calcification-severe   Left External Iliac Artery -   Minimal Diameter-4.4 mm   Tortuosity-mild   Calcification-moderate   Left Common Femoral Artery  -   Minimal Diameter-5.0 mm   Tortuosity-mild   Calcification-moderate   Review of the MIP images confirms the above findings.   IMPRESSION: Vascular:   1. Vascular findings and measurements pertinent to potential TAVR procedure, as detailed above. 2. Thickening and calcification of the aortic valve, compatible with reported clinical history of aortic stenosis. 3. Severe aortoiliac atherosclerosis. Severe narrowing of the proximal SMA and right renal arteries. Bilateral proximal internal iliac arteries are occluded with distal reconstitution of flow. 4. Patent stent of the right common iliac artery. 5. 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   Nonvascular:   1. New small soft tissue density filling defect of the distal common bile duct measuring 4 mm, may represent a stone or neoplasm. Recommend contrast-enhanced MRCP for further evaluation. 2. Solid subpleural nodule of the left lower lobe measuring 6 mm. Non-contrast chest CT at 6-12 months is recommended. If the nodule is stable at time of repeat CT, then future CT at 18-24 months (from today's scan) is considered optional for low-risk patients, but is recommended for high-risk patients. This recommendation follows the consensus statement: Guidelines for Management of Incidental Pulmonary Nodules Detected on CT Images: From the Fleischner Society 2017; Radiology 2017; 284:228-243. 3. 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 Consider pulmonary function tests and/or follow-up ILD protocol CT for further evaluation. 4. Age-indeterminate severe compression deformity of T8 and mild age-indeterminate L1 and L3 compression deformities. Correlate for point tenderness.     Electronically Signed   By: Allegra Lai M.D.   On: 10/02/2022 14:29      Impression:  This 78 year old woman has stage D, severe, symptomatic aortic stenosis with NYHA class II  symptoms of exertional fatigue and shortness of breath as well as tightness in her throat and jaw consistent with chronic diastolic congestive heart failure.  I have personally reviewed her 2D echocardiogram and CTA studies.  Her echocardiogram showed a calcified aortic valve with restricted leaflet mobility with a mean gradient of 38 mmHg, dimensionless index of 0.23, and a valve area of 0.8 cm consistent with severe aortic stenosis.  Left ventricular ejection fraction is 55 to 60%.  It was not felt that she required cardiac catheterization since her bypass grafts to the LAD and left circumflex appeared patent on her gated cardiac CTA and previous catheterization in June 2023 showed chronic total illusion of the left main, left circumflex, and diagonal branch with known occlusion of the vein graft to the diagonal.  I agree that aortic valve replacement is  indicated in this patient for relief of her symptoms and to prevent left ventricular deterioration.  Given her age and prior coronary bypass surgery with multiple comorbidities I think the best option for treating her would be transcatheter aortic valve replacement.  Her gated cardiac CT shows patent bypass grafts with anatomy suitable for TAVR using a 23-26 mm SAPIEN 3 valve.  Her abdominal and pelvic CTA shows severe diffuse aortoiliac calcific atherosclerosis that would preclude safe transfemoral insertion.  She also has a heavily calcified left subclavian artery that does not appear adequate for insertion.  Her left carotid artery appears suitable for insertion.  The patient and her daughter were counseled at length regarding treatment alternatives for management of severe symptomatic aortic stenosis. The risks and benefits of surgical intervention has been discussed in detail. Long-term prognosis with medical therapy was discussed. Alternative approaches such as conventional surgical aortic valve replacement, transcatheter aortic valve replacement, and  palliative medical therapy were compared and contrasted at length. This discussion was placed in the context of the patient's own specific clinical presentation and past medical history. All of their questions have been addressed.   Following the decision to proceed with transcatheter aortic valve replacement, a discussion was held regarding what types of management strategies would be attempted intraoperatively in the event of life-threatening complications, including whether or not the patient would be considered a candidate for the use of cardiopulmonary bypass and/or conversion to open sternotomy for attempted surgical intervention.  I do not think she is a candidate for open surgical aortic valve replacement and is not a candidate for emergent sternotomy to manage any intraoperative complications given her history of prior coronary bypass surgery with significant aortic calcification.  The patient has been advised of a variety of complications that might develop including but not limited to risks of death, stroke, paravalvular leak, aortic dissection or other major vascular complications, aortic annulus rupture, device embolization, cardiac rupture or perforation, mitral regurgitation, acute myocardial infarction, arrhythmia, heart block or bradycardia requiring permanent pacemaker placement, congestive heart failure, respiratory failure, renal failure, pneumonia, infection, other late complications related to structural valve deterioration or migration, or other complications that might ultimately cause a temporary or permanent loss of functional independence or other long term morbidity. The patient provides full informed consent for the procedure as described and all questions were answered.     Plan:  She will be scheduled for left carotid TAVR using a SAPIEN 3 valve on 03/13/2023.  I spent 60 minutes performing this consultation and > 50% of this time was spent face to face counseling and  coordinating the care of this patient's severe symptomatic aortic stenosis.   Alleen Borne, MD 02/28/2023

## 2023-03-06 ENCOUNTER — Other Ambulatory Visit: Payer: Self-pay

## 2023-03-06 DIAGNOSIS — I350 Nonrheumatic aortic (valve) stenosis: Secondary | ICD-10-CM

## 2023-03-09 ENCOUNTER — Other Ambulatory Visit: Payer: Self-pay

## 2023-03-09 ENCOUNTER — Ambulatory Visit (HOSPITAL_COMMUNITY)
Admission: RE | Admit: 2023-03-09 | Discharge: 2023-03-09 | Disposition: A | Discharge status: Home / Self Care | Source: Ambulatory Visit | Attending: Cardiovascular Disease | Admitting: Cardiovascular Disease

## 2023-03-09 ENCOUNTER — Encounter (HOSPITAL_COMMUNITY)
Admission: RE | Admit: 2023-03-09 | Discharge: 2023-03-09 | Disposition: A | Discharge status: Home / Self Care | Source: Ambulatory Visit | Attending: Cardiovascular Disease | Admitting: Cardiovascular Disease

## 2023-03-09 DIAGNOSIS — I700 Atherosclerosis of aorta: Secondary | ICD-10-CM | POA: Diagnosis not present

## 2023-03-09 DIAGNOSIS — Z01818 Encounter for other preprocedural examination: Secondary | ICD-10-CM | POA: Insufficient documentation

## 2023-03-09 DIAGNOSIS — R9431 Abnormal electrocardiogram [ECG] [EKG]: Secondary | ICD-10-CM | POA: Diagnosis not present

## 2023-03-09 DIAGNOSIS — Z01812 Encounter for preprocedural laboratory examination: Secondary | ICD-10-CM | POA: Diagnosis present

## 2023-03-09 DIAGNOSIS — I350 Nonrheumatic aortic (valve) stenosis: Secondary | ICD-10-CM | POA: Diagnosis not present

## 2023-03-09 DIAGNOSIS — Z01810 Encounter for preprocedural cardiovascular examination: Secondary | ICD-10-CM | POA: Diagnosis present

## 2023-03-09 LAB — CBC
HCT: 36.1 % (ref 36.0–46.0)
Hemoglobin: 12.2 g/dL (ref 12.0–15.0)
MCH: 30.7 pg (ref 26.0–34.0)
MCHC: 33.8 g/dL (ref 30.0–36.0)
MCV: 90.9 fL (ref 80.0–100.0)
Platelets: 144 10*3/uL — ABNORMAL LOW (ref 150–400)
RBC: 3.97 MIL/uL (ref 3.87–5.11)
RDW: 12.2 % (ref 11.5–15.5)
WBC: 5.9 10*3/uL (ref 4.0–10.5)
nRBC: 0 % (ref 0.0–0.2)

## 2023-03-09 LAB — URINALYSIS, ROUTINE W REFLEX MICROSCOPIC
Bacteria, UA: NONE SEEN
Bilirubin Urine: NEGATIVE
Glucose, UA: NEGATIVE mg/dL
Ketones, ur: NEGATIVE mg/dL
Leukocytes,Ua: NEGATIVE
Nitrite: NEGATIVE
Protein, ur: NEGATIVE mg/dL
Specific Gravity, Urine: 1.013 (ref 1.005–1.030)
pH: 5 (ref 5.0–8.0)

## 2023-03-09 LAB — COMPREHENSIVE METABOLIC PANEL
ALT: 16 U/L (ref 0–44)
AST: 26 U/L (ref 15–41)
Albumin: 4 g/dL (ref 3.5–5.0)
Alkaline Phosphatase: 74 U/L (ref 38–126)
Anion gap: 8 (ref 5–15)
BUN: 25 mg/dL — ABNORMAL HIGH (ref 8–23)
CO2: 26 mmol/L (ref 22–32)
Calcium: 9.8 mg/dL (ref 8.9–10.3)
Chloride: 103 mmol/L (ref 98–111)
Creatinine, Ser: 0.94 mg/dL (ref 0.44–1.00)
GFR, Estimated: >60 mL/min (ref >60)
Glucose, Bld: 101 mg/dL — ABNORMAL HIGH (ref 70–99)
Potassium: 4.2 mmol/L (ref 3.5–5.1)
Sodium: 137 mmol/L (ref 135–145)
Total Bilirubin: 0.3 mg/dL (ref 0.0–1.2)
Total Protein: 7.1 g/dL (ref 6.5–8.1)

## 2023-03-09 LAB — PROTIME-INR
INR: 1 (ref 0.8–1.2)
Prothrombin Time: 13.2 s (ref 11.4–15.2)

## 2023-03-09 LAB — SURGICAL PCR SCREEN
MRSA, PCR: NEGATIVE
Staphylococcus aureus: NEGATIVE

## 2023-03-09 LAB — TYPE AND SCREEN
ABO/RH(D): A POS
Antibody Screen: NEGATIVE

## 2023-03-09 NOTE — Progress Notes (Signed)
 Patient signed all consents at PAT lab appointment. CHG soap and instructions were given to patient and patients daughter Dawn Boyer. CHG surgical prep reviewed with patient and all questions answered.  Patients chart send to anesthesia for review. Pt denies any respiratory illness/infection in the last two months.

## 2023-03-12 MED ORDER — POTASSIUM CHLORIDE 2 MEQ/ML IV SOLN
80.0000 meq | INTRAVENOUS | Status: DC
  Filled 2023-03-12 (×2): qty 40

## 2023-03-12 MED ORDER — NOREPINEPHRINE 4 MG/250ML-% IV SOLN
0.0000 ug/min | INTRAVENOUS | Status: DC
  Filled 2023-03-12: qty 250

## 2023-03-12 MED ORDER — DEXMEDETOMIDINE HCL IN NACL 400 MCG/100ML IV SOLN
0.1000 ug/kg/h | INTRAVENOUS | Status: DC
  Filled 2023-03-12: qty 100

## 2023-03-12 MED ORDER — HEPARIN 30,000 UNITS/1000 ML (OHS) CELLSAVER SOLUTION
Status: DC
  Filled 2023-03-12 (×2): qty 1000

## 2023-03-12 MED ORDER — CEFAZOLIN SODIUM-DEXTROSE 2-4 GM/100ML-% IV SOLN
2.0000 g | INTRAVENOUS | Status: AC
Start: 2023-03-13 — End: 2023-03-14
  Administered 2023-03-13: 2 g via INTRAVENOUS
  Filled 2023-03-12: qty 100

## 2023-03-12 MED ORDER — MAGNESIUM SULFATE 50 % IJ SOLN
40.0000 meq | INTRAMUSCULAR | Status: DC
  Filled 2023-03-12 (×2): qty 9.85

## 2023-03-12 NOTE — H&P (Addendum)
 [address]       Dawn Boyer [address]             [phone]      Cardiothoracic Surgery Admission History and Physical   PCP is Dawn Has, MD Referring Provider is Dawn Bollman, MD Primary Cardiologist is Dawn Bears, MD   Reason for admission: Severe aortic stenosis   HPI:   The patient is a 78 year old woman with a history of hypertension, hyperlipidemia, hypothyroidism, coronary artery disease status post CABG by me in 1994 and saphenous vein graft stenting in 2019, peripheral arterial disease status post left subclavian artery stenting in 2019 and history of right iliac artery stenting, and severe aortic stenosis who was referred for consideration of TAVR.  Her last echo in August 2024 showed a trileaflet aortic valve with severe calcification and thickening and leaflet restriction.  The mean gradient was 38 mmHg with a peak of 62 mmHg.  Dimensionless index was 0.23 with a valve area of 0.8 cm.  There is mild aortic insufficiency.  Left ventricular ejection fraction was 55 to 60% with grade 1 diastolic dysfunction and mild LVH.  She had a cardiac CTA on 10/02/2022 showing an aortic valve calcium score of 3070.  A patent LIMA to the LAD and patent saphenous vein graft to the obtuse marginal.  She was seen by Dawn Boyer in September 2024 and was having some progression of her symptoms of discomfort in her neck and jaw as well as shortness of breath with physical activity.  Workup for TAVR was recommended but she did not feel that she was ready to move forward with TAVR at that time.  She was seen again in January 2025 and reported continued shortness of breath with activity and generalized fatigue.   She said that her main symptom is tightness and discomfort in her neck and jaw with exertion.  She Boyer also had some exertional shortness of breath and fatigue.  She denies any dizziness or syncope.  She Boyer had no orthopnea or PND.  She denies peripheral edema.        Past Medical History:  Diagnosis Date   Anemia      hx   Anxiety     Arthritis     Carotid arterial disease (HCC)      a. 04/2015 Carotid U/S: RICA 1-39%, LICA 40-59%, no change.   Coronary artery disease      a. 1994 s/p CABG;  b. 03/2012 MV: No ischemia/infarct. DES SVG-Diag 2019   Depression     Diverticulosis     Elevated LFTs     Fibrocystic breast     GERD (gastroesophageal reflux disease)     Hemorrhoids     Hyperlipidemia     Hypertension     Hypothyroidism     Insomnia     Nephrolithiasis     PONV (postoperative nausea and vomiting)      after heart surgery               Past Surgical History:  Procedure Laterality Date   ABDOMINAL AORTOGRAM W/LOWER EXTREMITY N/A 10/03/2017    Procedure: ABDOMINAL AORTOGRAM W/LOWER EXTREMITY;  Surgeon: Iran Ouch, MD;  Location: [facility];  Service: Cardiovascular;  Laterality: N/A;  Bilateral Limited   AORTIC ARCH ANGIOGRAPHY N/A 07/25/2017    Procedure: AORTIC ARCH ANGIOGRAPHY;  Surgeon: Iran Ouch, MD;  Location: [facility];  Service: Cardiovascular;  Laterality: N/A;  CARDIAC CATHETERIZATION       CHOLECYSTECTOMY   2002   CORONARY ARTERY BYPASS GRAFT   1993   CORONARY STENT INTERVENTION N/A 04/03/2017    Procedure: CORONARY STENT INTERVENTION;  Surgeon: Corky Crafts, MD;  Location: [facility] INVASIVE CV LAB;  Service: Cardiovascular;  Laterality: N/A;   EVALUATION UNDER ANESTHESIA WITH HEMORRHOIDECTOMY N/A 03/14/2012    Procedure: EXAM UNDER ANESTHESIA WITH Excisional HEMORRHOIDECTOMY and Hemorrhoidal banding;  Surgeon: Atilano Ina, MD;  Location: [facility] OR;  Service: General;  Laterality: N/A;  x 2 hemorrhoids   HEMORRHOID SURGERY       LEFT HEART CATH AND CORS/GRAFTS ANGIOGRAPHY N/A 04/03/2017    Procedure: LEFT HEART CATH AND CORS/GRAFTS ANGIOGRAPHY;  Surgeon: Corky Crafts, MD;  Location: [facility];  Service: Cardiovascular;  Laterality: N/A;   PERIPHERAL VASCULAR INTERVENTION  Left 07/25/2017    Procedure: PERIPHERAL VASCULAR INTERVENTION;  Surgeon: Iran Ouch, MD;  Location: [facility];  Service: Cardiovascular;  Laterality: Left;  Subclavian   PERIPHERAL VASCULAR INTERVENTION   10/03/2017    Procedure: PERIPHERAL VASCULAR INTERVENTION;  Surgeon: Iran Ouch, MD;  Location: [facility];  Service: Cardiovascular;;  LCIA   RIGHT/LEFT HEART CATH AND CORONARY/GRAFT ANGIOGRAPHY N/A 06/13/2021    Procedure: RIGHT/LEFT HEART CATH AND CORONARY/GRAFT ANGIOGRAPHY;  Surgeon: Corky Crafts, MD;  Location[facility] INVASIVE CV LAB;  Service: Cardiovascular;  Laterality: N/A;   TUBAL LIGATION   1970   ULTRASOUND GUIDANCE FOR VASCULAR ACCESS   04/03/2017    Procedure: Ultrasound Guidance For Vascular Access;  Surgeon: Corky Crafts, MD;  Location: [facility] INVASIVE CV LAB;  Service: Cardiovascular;;   UPPER EXTREMITY ANGIOGRAPHY Left 07/25/2017    Procedure: UPPER EXTREMITY ANGIOGRAPHY;  Surgeon: Iran Ouch, MD;  Location: [facility];  Service: Cardiovascular;  Laterality: Left;               Family History  Problem Relation Age of Onset   Hypertension Sister     Hyperlipidemia Sister     Heart disease Brother     Stroke Sister     Heart Problems Son          Pt states son had a "skipped heart beats"   Diabetes Daughter     Colon cancer Neg Hx     Esophageal cancer Neg Hx     Rectal cancer Neg Hx     Stomach cancer Neg Hx            Social History         Socioeconomic History   Marital status: Married      Spouse name: Not on file   Number of children: Not on file   Years of education: Not on file   Highest education level: Not on file  Occupational History   Not on file  Tobacco Use   Smoking status: Former      Current packs/day: 0.00      Average packs/day: 1 pack/day for 12.0 years (12.0 ttl pk-yrs)      Types: Cigarettes      Start date: 11/21/1970      Quit date: 11/21/1982      Years since quitting: 40.3    Smokeless tobacco: Never  Vaping Use   Vaping status: Never Used  Substance and Sexual Activity   Alcohol use: No      Alcohol/week: 0.0 standard drinks of alcohol   Drug use: No  Sexual activity: Not on file  Other Topics Concern   Not on file  Social History Narrative   Not on file    Social Drivers of Health    Financial Resource Strain: Not on file  Food Insecurity: Not on file  Transportation Needs: Not on file  Physical Activity: Not on file  Stress: Not on file  Social Connections: Not on file  Intimate Partner Violence: Not on file             Prior to Admission medications   Medication Sig Start Date End Date Taking? Authorizing Provider  ALPRAZolam Prudy Feeler) 1 MG tablet Take 1 mg by mouth at bedtime.  12/09/13   Yes [provider]  amLODipine (NORVASC) 5 MG tablet Take 1 tablet (5 mg total) by mouth daily. 01/20/22   Yes Iran Ouch, MD  b complex vitamins capsule Take 1 capsule by mouth daily.     Yes [provider]  cholecalciferol (VITAMIN D3) 25 MCG (1000 UNIT) tablet Take 5,000 Units by mouth daily. W Vit K ( K2 and D3)     Yes [provider]  clopidogrel (PLAVIX) 75 MG tablet TAKE 1 TABLET EVERY DAY 11/27/22   Yes Iran Ouch, MD  co-enzyme Q-10 30 MG capsule Take 30 mg by mouth 2 (two) times daily.     Yes [provider]  ezetimibe (ZETIA) 10 MG tablet TAKE 1 TABLET EVERY DAY 11/27/22   Yes Iran Ouch, MD  isosorbide mononitrate (IMDUR) 30 MG 24 hr tablet Take 1/2 tablet (15 mg total) by mouth daily. 06/15/21   Yes Jonita Albee, PA-C  linaclotide (LINZESS) 290 MCG CAPS capsule Take 290 mcg by mouth daily as needed (constipation).     Yes [provider]  metoprolol succinate (TOPROL-XL) 50 MG 24 hr tablet Take 1 tablet (50 mg total) by mouth 2 (two) times daily. Take with or immediately following a meal. 06/14/21   Yes Jonita Albee, PA-C  Multiple Vitamins-Minerals (ZINC PO) Take by  mouth daily in the afternoon.     Yes [provider]  nitroGLYCERIN (NITROSTAT) 0.4 MG SL tablet Place 1 tablet (0.4 mg total) under the tongue every 5 (five) minutes as needed for chest pain. PLACE ONE TABLET UNDER THE TONGUE EVERY 5 MINUTES FOR THREE DOSES AS NEEDED FOR CHEST PAIN 06/28/21   Yes Furth, Cadence H, PA-C  pantoprazole (PROTONIX) 40 MG tablet Take 40 mg by mouth at bedtime.     Yes [provider]  PRESCRIPTION MEDICATION PATIENT DOES NOT TAKE ANY VACCINES     Yes [provider]  sertraline (ZOLOFT) 100 MG tablet Take 50 mg by mouth daily. 01/19/17   Yes [provider]  Sodium Phosphates (ENEMA RE) Place 1 each rectally 2 (two) times daily. May use an additional 2 times as needed for constipation     Yes [provider]  vitamin C (ASCORBIC ACID) 500 MG tablet Take 500 mg by mouth daily.     Yes [provider]            Current Outpatient Medications  Medication Sig Dispense Refill   ALPRAZolam (XANAX) 1 MG tablet Take 1 mg by mouth at bedtime.        amLODipine (NORVASC) 5 MG tablet Take 1 tablet (5 mg total) by mouth daily. 90 tablet 3   b complex vitamins capsule Take 1 capsule by mouth daily.       cholecalciferol (VITAMIN  D3) 25 MCG (1000 UNIT) tablet Take 5,000 Units by mouth daily. W Vit K ( K2 and D3)       clopidogrel (PLAVIX) 75 MG tablet TAKE 1 TABLET EVERY DAY 90 tablet 3   co-enzyme Q-10 30 MG capsule Take 30 mg by mouth 2 (two) times daily.       ezetimibe (ZETIA) 10 MG tablet TAKE 1 TABLET EVERY DAY 90 tablet 3   isosorbide mononitrate (IMDUR) 30 MG 24 hr tablet Take 1/2 tablet (15 mg total) by mouth daily. 90 tablet 3   linaclotide (LINZESS) 290 MCG CAPS capsule Take 290 mcg by mouth daily as needed (constipation).       metoprolol succinate (TOPROL-XL) 50 MG 24 hr tablet Take 1 tablet (50 mg total) by mouth 2 (two) times daily. Take with or immediately following a meal. 180 tablet 3   Multiple  Vitamins-Minerals (ZINC PO) Take by mouth daily in the afternoon.       nitroGLYCERIN (NITROSTAT) 0.4 MG SL tablet Place 1 tablet (0.4 mg total) under the tongue every 5 (five) minutes as needed for chest pain. PLACE ONE TABLET UNDER THE TONGUE EVERY 5 MINUTES FOR THREE DOSES AS NEEDED FOR CHEST PAIN 25 tablet 3   pantoprazole (PROTONIX) 40 MG tablet Take 40 mg by mouth at bedtime.       PRESCRIPTION MEDICATION PATIENT DOES NOT TAKE ANY VACCINES       sertraline (ZOLOFT) 100 MG tablet Take 50 mg by mouth daily.       Sodium Phosphates (ENEMA RE) Place 1 each rectally 2 (two) times daily. May use an additional 2 times as needed for constipation       vitamin C (ASCORBIC ACID) 500 MG tablet Take 500 mg by mouth daily.          No current facility-administered medications for this visit.        Allergies  No Known Allergies         Review of Systems:               General:                      Normal appetite, + decreased energy, no weight gain, no weight loss, no fever             Cardiac:                       no chest pain with exertion, no chest pain at rest, +SOB with moderate exertion, no resting SOB, no PND, no orthopnea, no palpitations, no arrhythmia, no atrial fibrillation, no LE edema, no dizzy spells, no syncope             Respiratory:                 + exertioal shortness of breath, no home oxygen, no productive cough, + dry cough, no bronchitis, no wheezing, no hemoptysis, no asthma, no pain with inspiration or cough, no sleep apnea, no CPAP at night             GI:                               no difficulty swallowing, no reflux, no frequent heartburn, no hiatal hernia, no abdominal pain, no constipation, no diarrhea, no hematochezia, no hematemesis, no melena  GU:                              no dysuria,  no frequency, no urinary tract infection, no hematuria, no kidney stones, no kidney disease             Vascular:                     no pain suggestive of  claudication, no pain in feet, no leg cramps, no varicose veins, no DVT, no non-healing foot ulcer             Neuro:                         no stroke, no TIA's, no seizures, no headaches, no temporary blindness one eye,  no slurred speech, no peripheral neuropathy, no chronic pain, no instability of gait, no memory/cognitive dysfunction             Musculoskeletal:         no arthritis, no joint swelling, no myalgias, no difficulty walking, normal mobility              Skin:                            no rash, no itching, no skin infections, no pressure sores or ulcerations             Psych:                         + anxiety, + depression, + nervousness, no unusual recent stress             Eyes:                           no blurry vision, no floaters, no recent vision changes, no  glasses or contacts             ENT:                            no hearing loss, no loose or painful teeth, + dentures             Hematologic:               + easy bruising, no abnormal bleeding, no clotting disorder, no frequent epistaxis             Endocrine:                   no diabetes, does not check CBG's at home                            Physical Exam:               BP (!) 110/59   Pulse 80   Resp 18   Ht 5\' 4"  (1.626 m)   Wt 116 lb (52.6 kg)   SpO2 95% Comment: RA  BMI 19.91 kg/m              General:                      Thin woman in no  distress             HEENT:                       Unremarkable, NCAT, PERLA, EOMI             Neck:                           no JVD, no bruits, no adenopathy              Chest:                          clear to auscultation, symmetrical breath sounds, no wheezes, no rhonchi              CV:                              RRR, 3/6 systolic murmur RSB, no diastolic murmur             Abdomen:                    soft, non-tender, no masses              Extremities:                 warm, well-perfused, pedal pulses palpable, no lower extremity edema              Rectal/GU                   Deferred             Neuro:                         Grossly non-focal and symmetrical throughout             Skin:                            Clean and dry, no rashes, no breakdown   Diagnostic Tests:   ECHOCARDIOGRAM REPORT       Patient Name:   Dawn Boyer Teaney Date of Exam: 08/25/2022  Medical Rec #:  [id number]    Height:       60.0 in  Accession #:    [id number]   Weight:       118.1 lb  Date of Birth:  1945/02/03     BSA:          1.492 m  Patient Age:    77 years     BP:           100/60 mmHg  Patient Gender: F            HR:           70 bpm.  Exam Location:  [place]   Procedure: 2D Echo, Cardiac Doppler and Color Doppler   Indications:    I35.0 Nonrheumatic aortic (valve) stenosis    History:        Patient Boyer prior history of Echocardiogram examinations,  most                 recent 06/12/2021. CAD, Prior CABG, Carotid Disease, Aortic  Valve  Disease, Signs/Symptoms:Shortness of Breath; Risk                  Factors:Hypertension, Dyslipidemia and Former Smoker.    Sonographer:    Quentin Ore RDMS, RVT, RDCS  Referring Phys: [id number] MUHAMMAD A ARIDA   IMPRESSIONS     1. Left ventricular ejection fraction, by estimation, is 55 to 60%. The  left ventricle Boyer normal function. The left ventricle Boyer no regional  wall motion abnormalities. There is mild left ventricular hypertrophy.  Left ventricular diastolic parameters  are consistent with Grade I diastolic dysfunction (impaired relaxation).   2. Right ventricular systolic function is normal. The right ventricular  size is normal.   3. Left atrial size was moderately dilated.   4. The mitral valve is normal in structure. Mild mitral valve  regurgitation.   5. Mean aortic valve gradient , Peak G , Vmax 3.95 m/s, DVI=  0.23, AVA 0.8cm2.. The aortic valve is tricuspid. Aortic valve  regurgitation is mild. Severe aortic valve stenosis.   6. The inferior vena cava is  normal in size with greater than 50%  respiratory variability, suggesting right atrial pressure of 3 mmHg.   FINDINGS   Left Ventricle: Left ventricular ejection fraction, by estimation, is 55  to 60%. The left ventricle Boyer normal function. The left ventricle Boyer no  regional wall motion abnormalities. The left ventricular internal cavity  size was normal in size. There is   mild left ventricular hypertrophy. Left ventricular diastolic parameters  are consistent with Grade I diastolic dysfunction (impaired relaxation).   Right Ventricle: The right ventricular size is normal. No increase in  right ventricular wall thickness. Right ventricular systolic function is  normal.   Left Atrium: Left atrial size was moderately dilated.   Right Atrium: Right atrial size was normal in size.   Pericardium: There is no evidence of pericardial effusion.   Mitral Valve: The mitral valve is normal in structure. Mild mitral valve  regurgitation.   Tricuspid Valve: The tricuspid valve is normal in structure. Tricuspid  valve regurgitation is mild.   Aortic Valve: Mean aortic valve gradient , Peak G , Vmax 3.95  m/s, DVI= 0.23, AVA 0.8cm2. The aortic valve is tricuspid. Aortic valve  regurgitation is mild. Severe aortic stenosis is present. Aortic valve  mean gradient measures 31.8 mmHg.  Aortic valve peak gradient measures 59.4 mmHg. Aortic valve area, by VTI  measures 0.83 cm.   Pulmonic Valve: The pulmonic valve was normal in structure. Pulmonic valve  regurgitation is mild to moderate.   Aorta: The aortic root is normal in size and structure.   Venous: The inferior vena cava is normal in size with greater than 50%  respiratory variability, suggesting right atrial pressure of 3 mmHg.   IAS/Shunts: No atrial level shunt detected by color flow Doppler.     LEFT VENTRICLE  PLAX 2D  LVIDd:         4.50 cm     Diastology  LVIDs:         3.10 cm     LV e' medial:    4.24 cm/s   LV PW:         1.10 cm     LV E/e' medial:  14.7  LV IVS:        1.30 cm     LV e' lateral:   6.64 cm/s  LVOT diam:     2.10 cm     LV E/e' lateral: 9.4  LV SV:         75  LV SV Index:   50  LVOT Area:     3.46 cm    LV Volumes (MOD)  LV vol d, MOD A2C: 73.7 ml  LV vol d, MOD A4C: 76.0 ml  LV vol s, MOD A2C: 35.8 ml  LV vol s, MOD A4C: 35.6 ml  LV SV MOD A2C:     37.9 ml  LV SV MOD A4C:     76.0 ml  LV SV MOD BP:      38.3 ml   RIGHT VENTRICLE             IVC  RV S prime:     12.10 cm/s  IVC diam: 1.30 cm  TAPSE (M-mode): 1.6 cm   LEFT ATRIUM             Index        RIGHT ATRIUM           Index  LA diam:        4.40 cm 2.95 cm/m   RA Area:     14.40 cm  LA Vol (A2C):   55.8 ml 37.39 ml/m  RA Volume:   36.30 ml  24.32 ml/m  LA Vol (A4C):   62.7 ml 42.02 ml/m  LA Biplane Vol: 60.3 ml 40.41 ml/m   AORTIC VALVE                     PULMONIC VALVE  AV Area (Vmax):    0.86 cm      PV Vmax:          1.01 m/s  AV Area (Vmean):   0.80 cm      PV Peak grad:     4.1 mmHg  AV Area (VTI):     0.83 cm      PR End Diast Vel: 8.41 msec  AV Vmax:           385.33 cm/s  AV Vmean:          286.500 cm/s  AV VTI:            0.906 m  AV Peak Grad:      59.4 mmHg  AV Mean Grad:      31.8 mmHg  LVOT Vmax:         95.40 cm/s  LVOT Vmean:        65.867 cm/s  LVOT VTI:          0.217 m  LVOT/AV VTI ratio: 0.24    AORTA  Ao Root diam: 2.70 cm  Ao Asc diam:  3.50 cm  Ao Arch diam: 2.3 cm   MITRAL VALVE               TRICUSPID VALVE  MV Area (PHT): 3.08 cm    TR Peak grad:   32.9 mmHg  MV Decel Time: 246 msec    TR Vmax:        287.00 cm/s  MV E velocity: 62.40 cm/s  MV A velocity: 98.00 cm/s  SHUNTS  MV E/A ratio:  0.64        Systemic VTI:  0.22 m                             Systemic Diam: 2.10 cm   Debbe Odea MD  Electronically signed by Debbe Odea MD  Signature Date/Time:  08/25/2022/4:24:47 PM        Final        ADDENDUM REPORT: 10/02/2022 14:32    EXAM: OVER-READ INTERPRETATION  CT CHEST   The following report is an over-read performed by radiologist Dr. Jacob Moores [facility] on 10/02/2022. This over-read does not include interpretation of cardiac or coronary anatomy or pathology. The cardiac TAVR interpretation by the cardiologist is attached.   COMPARISON:  None.   FINDINGS: Extracardiac findings will be described separately under dictation for contemporaneously obtained CTA chest, abdomen and pelvis.   IMPRESSION: Please see separate dictation for contemporaneously obtained CTA chest, abdomen and pelvis dated 10/02/2022 for full description of relevant extracardiac findings.     Electronically Signed   By: Allegra Lai M.D.   On: 10/02/2022 14:32    Addended by Renford Dills, MD on 10/02/2022  2:35 PM    Study Result   Narrative & Impression  CLINICAL DATA:  Aortic Stenosis   EXAM: Cardiac TAVR CT   TECHNIQUE: The patient was scanned on a Siemens Force 192 slice scanner. A 120 kV retrospective scan was triggered in the ascending thoracic aorta at 140 HU's. Gantry rotation speed was 250 msecs and collimation was .6 mm. No beta blockade or nitro were given. The 3D data set was reconstructed in 5% intervals of the R-R cycle. Systolic and diastolic phases were analyzed on a dedicated work station using MPR, MIP and VRT modes. The patient received 80 cc of contrast.   FINDINGS: Aortic Valve: Calcified tri leaflet AV with score 3070   Aorta:   Sino-tubular Junction: 23.2 mm   Ascending Thoracic Aorta: 33.4 mm   Aortic Arch: 25 mm   Descending Thoracic Aorta: 22 mm   Sinus of Valsalva Measurements:   Non-coronary: 26.4 mm  Height 21.6 mm   Right - coronary: 25.1 mm  Height 20.8 mm   Left -   coronary: 26 mm  Height 19.4 mm   Coronary Artery Height above Annulus:   Left Main: 14.5 mm above annulus   Right Coronary: 14.3 mm above annulus   Virtual Basal Annulus  Measurements:   Maximum / Minimum Diameter: 24.6 mm x 22 mm Average diameter 23.9 mm   Perimeter: 76.8 mm   Area: 450 mm2   Coronary Arteries: Sufficient height above annulus for deployment Patent SVG to OM and patent LIMA to LAD   Optimum Fluoroscopic Angle for Delivery: LAO 18 Caudal 9 degrees   IMPRESSION: 1.  Calcified tri leaflet AV with calcium score 3070   2. Annular area of 450 mm2 suitable for a 26 mm Sapien 3 valve Sinus diameter not suitable for a Medtronic Evolut   3. Coronary arteries sufficient height above annulus for deployment Patent LIMA to LAD and SVG to OM   4.  Bovine arch with patent stent in proximal left subclavian artery   5. Optimum angiographic angle for deployment LAO 18 Caudal 9 degrees   6.  Membranous septal length 6.5 mm   Charlton Haws   Electronically Signed: By: Charlton Haws M.D. On: 10/02/2022 13:26        Narrative & Impression  CLINICAL DATA:  Aortic valve replacement preop evaluation   EXAM: CT ANGIOGRAPHY CHEST, ABDOMEN AND PELVIS   TECHNIQUE: Multidetector CT imaging through the chest, abdomen and pelvis was performed using the standard protocol during bolus administration of intravenous contrast. Multiplanar reconstructed images and MIPs were obtained and reviewed to evaluate the vascular anatomy.   RADIATION  DOSE REDUCTION: This exam was performed according to the departmental dose-optimization program which includes automated exposure control, adjustment of the mA and/or kV according to patient size and/or use of iterative reconstruction technique.   CONTRAST:  95mL OMNIPAQUE IOHEXOL 350 MG/ML SOLN   COMPARISON:  CT abdomen and pelvis dated March 19, 2020   FINDINGS: CTA CHEST FINDINGS   Cardiovascular: Normal heart size. No pericardial effusion. Normal caliber thoracic aorta with severe atherosclerotic disease. Aortic valve thickening and calcifications. Severe left main and three-vessel coronary artery  calcifications status post CABG.   Mediastinum/Nodes: Esophagus and thyroid are unremarkable. No enlarged lymph nodes seen in the chest.   Lungs/Pleura: Central airways are patent. Lower lung subpleural predominant reticular and ground-glass opacities with associated traction bronchiectasis and bronchiolectasis. No consolidation, pleural effusion or pneumothorax. Solid subpleural nodule of the left lower lobe measuring 6 mm on series 5, image 75.   Musculoskeletal: Prior median sternotomy with intact sternal wires. Age-indeterminate severe compression deformity of T8. Unchanged severe compression deformity of T12.   CTA ABDOMEN AND PELVIS FINDINGS   Hepatobiliary: No focal liver abnormality is seen. Status post cholecystectomy. No biliary dilatation. Small soft tissue density filling defect of the distal common bile duct measuring 4 mm on series 4, image 113.   Pancreas: Unremarkable. No pancreatic ductal dilatation or surrounding inflammatory changes.   Spleen: Normal in size without focal abnormality.   Adrenals/Urinary Tract: Bilateral adrenal glands are unremarkable. No hydronephrosis or nephrolithiasis. Nonobstructing right renal stone. Simple appearing cyst of the right kidney, no specific follow-up imaging is necessary. Bladder is unremarkable.   Stomach/Bowel: Stomach is within normal limits. Appendix appears normal. No evidence of bowel wall thickening, distention, or inflammatory changes.   Vascular/lymphatic: No enlarged lymph nodes seen in the abdomen or pelvis. Severe atherosclerotic disease of the abdominal aorta. Bilateral proximal internal iliac arteries are occluded with distal reconstitution of flow. Severe narrowing of the proximal SMA and right renal arteries.   Reproductive: Uterus and bilateral adnexa are unremarkable.   Other: No abdominal wall hernia or abnormality. No abdominopelvic ascites.   Musculoskeletal: Unchanged moderate L2 compression  deformity. New mild age-indeterminate L1 and L3 compression deformities when compared with March 19, 2020 prior.   VASCULAR MEASUREMENTS PERTINENT TO TAVR:   AORTA:   Minimal Aortic Diameter -  6.0 mm   Severity of Aortic Calcification-severe   RIGHT PELVIS:   Right Common Iliac Artery -   Minimal Diameter-2.5 mm   Tortuosity-mild   Calcification-severe   Right External Iliac Artery -   Minimal Diameter-3.8 mm   Tortuosity-mild   Calcification-moderate   Right Common Femoral Artery -   Minimal Diameter-7.2 mm   Tortuosity-mild   Calcification-moderate   LEFT PELVIS:   Left Common Iliac Artery -   Minimal Diameter-3.1 mm   Tortuosity-mild   Calcification-severe   Left External Iliac Artery -   Minimal Diameter-4.4 mm   Tortuosity-mild   Calcification-moderate   Left Common Femoral Artery -   Minimal Diameter-5.0 mm   Tortuosity-mild   Calcification-moderate   Review of the MIP images confirms the above findings.   IMPRESSION: Vascular:   1. Vascular findings and measurements pertinent to potential TAVR procedure, as detailed above. 2. Thickening and calcification of the aortic valve, compatible with reported clinical history of aortic stenosis. 3. Severe aortoiliac atherosclerosis. Severe narrowing of the proximal SMA and right renal arteries. Bilateral proximal internal iliac arteries are occluded with distal reconstitution of flow. 4. Patent stent of the right common  iliac artery. 5. 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   Nonvascular:   1. New small soft tissue density filling defect of the distal common bile duct measuring 4 mm, may represent a stone or neoplasm. Recommend contrast-enhanced MRCP for further evaluation. 2. Solid subpleural nodule of the left lower lobe measuring 6 mm. Non-contrast chest CT at 6-12 months is recommended. If the nodule is stable at time of repeat CT, then future CT at  18-24 months (from today's scan) is considered optional for low-risk patients, but is recommended for high-risk patients. This recommendation follows the consensus statement: Guidelines for Management of Incidental Pulmonary Nodules Detected on CT Images: From the Fleischner Society 2017; Radiology 2017; 284:228-243. 3. 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 Consider pulmonary function tests and/or follow-up ILD protocol CT for further evaluation. 4. Age-indeterminate severe compression deformity of T8 and mild age-indeterminate L1 and L3 compression deformities. Correlate for point tenderness.     Electronically Signed   By: Allegra Lai M.D.   On: 10/02/2022 14:29        Impression:   This 78 year old woman Boyer stage D, severe, symptomatic aortic stenosis with NYHA class II symptoms of exertional fatigue and shortness of breath as well as tightness in her throat and jaw consistent with chronic diastolic congestive heart failure.  I have personally reviewed her 2D echocardiogram and CTA studies.  Her echocardiogram showed a calcified aortic valve with restricted leaflet mobility with a mean gradient of 38 mmHg, dimensionless index of 0.23, and a valve area of 0.8 cm consistent with severe aortic stenosis.  Left ventricular ejection fraction is 55 to 60%.  It was not felt that she required cardiac catheterization since her bypass grafts to the LAD and left circumflex appeared patent on her gated cardiac CTA and previous catheterization in June 2023 showed chronic total illusion of the left main, left circumflex, and diagonal branch with known occlusion of the vein graft to the diagonal.  I agree that aortic valve replacement is indicated in this patient for relief of her symptoms and to prevent left ventricular deterioration.  Given her age and prior coronary bypass surgery with multiple comorbidities I think the best option for treating her would  be transcatheter aortic valve replacement.  Her gated cardiac CT shows patent bypass grafts with anatomy suitable for TAVR using a 23-26 mm SAPIEN 3 valve.  Her abdominal and pelvic CTA shows severe diffuse aortoiliac calcific atherosclerosis that would preclude safe transfemoral insertion.  She also Boyer a heavily calcified left subclavian artery that does not appear adequate for insertion.  Her left carotid artery appears suitable for insertion.   The patient and her daughter were counseled at length regarding treatment alternatives for management of severe symptomatic aortic stenosis. The risks and benefits of surgical intervention Boyer been discussed in detail. Long-term prognosis with medical therapy was discussed. Alternative approaches such as conventional surgical aortic valve replacement, transcatheter aortic valve replacement, and palliative medical therapy were compared and contrasted at length. This discussion was placed in the context of the patient's own specific clinical presentation and past medical history. All of their questions have been addressed.    Following the decision to proceed with transcatheter aortic valve replacement, a discussion was held regarding what types of management strategies would be attempted intraoperatively in the event of life-threatening complications, including whether or not the patient would be considered a candidate for the use of cardiopulmonary bypass and/or conversion to  open sternotomy for attempted surgical intervention.  I do not think she is a candidate for open surgical aortic valve replacement and is not a candidate for emergent sternotomy to manage any intraoperative complications given her history of prior coronary bypass surgery with significant aortic calcification.  The patient Boyer been advised of a variety of complications that might develop including but not limited to risks of death, stroke, paravalvular leak, aortic dissection or other major  vascular complications, aortic annulus rupture, device embolization, cardiac rupture or perforation, mitral regurgitation, acute myocardial infarction, arrhythmia, heart block or bradycardia requiring permanent pacemaker placement, congestive heart failure, respiratory failure, renal failure, pneumonia, infection, other late complications related to structural valve deterioration or migration, or other complications that might ultimately cause a temporary or permanent loss of functional independence or other long term morbidity. The patient provides full informed consent for the procedure as described and all questions were answered.     Plan:   Left carotid TAVR using a SAPIEN 3 valve.

## 2023-03-13 ENCOUNTER — Inpatient Hospital Stay (HOSPITAL_COMMUNITY): Payer: Self-pay | Admitting: Certified Registered Nurse Anesthetist

## 2023-03-13 ENCOUNTER — Inpatient Hospital Stay (HOSPITAL_COMMUNITY)

## 2023-03-13 ENCOUNTER — Inpatient Hospital Stay (HOSPITAL_COMMUNITY)
Admission: RE | Admit: 2023-03-13 | Discharge: 2023-03-14 | DRG: 267 | Disposition: A | Discharge status: Home / Self Care | Attending: Cardiovascular Disease | Admitting: Cardiovascular Disease

## 2023-03-13 ENCOUNTER — Other Ambulatory Visit: Payer: Self-pay | Admitting: Physician Assistant

## 2023-03-13 ENCOUNTER — Encounter (HOSPITAL_COMMUNITY)
Admission: RE | Disposition: A | Discharge status: Home / Self Care | Payer: Self-pay | Source: Home / Self Care | Attending: Cardiovascular Disease

## 2023-03-13 ENCOUNTER — Other Ambulatory Visit (HOSPITAL_COMMUNITY)

## 2023-03-13 ENCOUNTER — Encounter (HOSPITAL_COMMUNITY): Payer: Self-pay | Admitting: Cardiovascular Disease

## 2023-03-13 DIAGNOSIS — Z83438 Family history of other disorder of lipoprotein metabolism and other lipidemia: Secondary | ICD-10-CM

## 2023-03-13 DIAGNOSIS — R911 Solitary pulmonary nodule: Secondary | ICD-10-CM | POA: Diagnosis present

## 2023-03-13 DIAGNOSIS — I110 Hypertensive heart disease with heart failure: Secondary | ICD-10-CM | POA: Diagnosis not present

## 2023-03-13 DIAGNOSIS — I25119 Atherosclerotic heart disease of native coronary artery with unspecified angina pectoris: Secondary | ICD-10-CM | POA: Diagnosis not present

## 2023-03-13 DIAGNOSIS — Z8249 Family history of ischemic heart disease and other diseases of the circulatory system: Secondary | ICD-10-CM

## 2023-03-13 DIAGNOSIS — K649 Unspecified hemorrhoids: Secondary | ICD-10-CM | POA: Diagnosis present

## 2023-03-13 DIAGNOSIS — Z87891 Personal history of nicotine dependence: Secondary | ICD-10-CM

## 2023-03-13 DIAGNOSIS — I350 Nonrheumatic aortic (valve) stenosis: Secondary | ICD-10-CM

## 2023-03-13 DIAGNOSIS — Z79899 Other long term (current) drug therapy: Secondary | ICD-10-CM | POA: Diagnosis not present

## 2023-03-13 DIAGNOSIS — M4854XA Collapsed vertebra, not elsewhere classified, thoracic region, initial encounter for fracture: Secondary | ICD-10-CM | POA: Diagnosis present

## 2023-03-13 DIAGNOSIS — M1990 Unspecified osteoarthritis, unspecified site: Secondary | ICD-10-CM | POA: Diagnosis present

## 2023-03-13 DIAGNOSIS — N200 Calculus of kidney: Secondary | ICD-10-CM | POA: Diagnosis present

## 2023-03-13 DIAGNOSIS — R509 Fever, unspecified: Secondary | ICD-10-CM | POA: Diagnosis not present

## 2023-03-13 DIAGNOSIS — I10 Essential (primary) hypertension: Secondary | ICD-10-CM | POA: Diagnosis not present

## 2023-03-13 DIAGNOSIS — Z006 Encounter for examination for normal comparison and control in clinical research program: Secondary | ICD-10-CM

## 2023-03-13 DIAGNOSIS — I739 Peripheral vascular disease, unspecified: Secondary | ICD-10-CM | POA: Diagnosis present

## 2023-03-13 DIAGNOSIS — Z952 Presence of prosthetic heart valve: Secondary | ICD-10-CM | POA: Diagnosis not present

## 2023-03-13 DIAGNOSIS — I5032 Chronic diastolic (congestive) heart failure: Secondary | ICD-10-CM | POA: Diagnosis not present

## 2023-03-13 DIAGNOSIS — J849 Interstitial pulmonary disease, unspecified: Secondary | ICD-10-CM | POA: Diagnosis present

## 2023-03-13 DIAGNOSIS — F32A Depression, unspecified: Secondary | ICD-10-CM | POA: Diagnosis present

## 2023-03-13 DIAGNOSIS — I2510 Atherosclerotic heart disease of native coronary artery without angina pectoris: Secondary | ICD-10-CM | POA: Diagnosis not present

## 2023-03-13 DIAGNOSIS — E039 Hypothyroidism, unspecified: Secondary | ICD-10-CM | POA: Diagnosis present

## 2023-03-13 DIAGNOSIS — K219 Gastro-esophageal reflux disease without esophagitis: Secondary | ICD-10-CM | POA: Diagnosis present

## 2023-03-13 DIAGNOSIS — Z951 Presence of aortocoronary bypass graft: Secondary | ICD-10-CM

## 2023-03-13 DIAGNOSIS — Z7902 Long term (current) use of antithrombotics/antiplatelets: Secondary | ICD-10-CM

## 2023-03-13 DIAGNOSIS — I358 Other nonrheumatic aortic valve disorders: Secondary | ICD-10-CM | POA: Diagnosis not present

## 2023-03-13 DIAGNOSIS — E876 Hypokalemia: Secondary | ICD-10-CM | POA: Diagnosis present

## 2023-03-13 DIAGNOSIS — I700 Atherosclerosis of aorta: Secondary | ICD-10-CM | POA: Diagnosis present

## 2023-03-13 DIAGNOSIS — Z87442 Personal history of urinary calculi: Secondary | ICD-10-CM

## 2023-03-13 DIAGNOSIS — F419 Anxiety disorder, unspecified: Secondary | ICD-10-CM | POA: Diagnosis present

## 2023-03-13 DIAGNOSIS — Z9861 Coronary angioplasty status: Secondary | ICD-10-CM

## 2023-03-13 DIAGNOSIS — I708 Atherosclerosis of other arteries: Secondary | ICD-10-CM | POA: Diagnosis not present

## 2023-03-13 DIAGNOSIS — Z833 Family history of diabetes mellitus: Secondary | ICD-10-CM | POA: Diagnosis not present

## 2023-03-13 DIAGNOSIS — E785 Hyperlipidemia, unspecified: Secondary | ICD-10-CM | POA: Diagnosis present

## 2023-03-13 DIAGNOSIS — Z9049 Acquired absence of other specified parts of digestive tract: Secondary | ICD-10-CM

## 2023-03-13 HISTORY — DX: Peripheral vascular disease, unspecified: I73.9

## 2023-03-13 HISTORY — PX: TRANSCATHETER AORTIC VALVE REPLACEMENT,CAROTID: CATH118320

## 2023-03-13 HISTORY — DX: Presence of prosthetic heart valve: Z95.2

## 2023-03-13 HISTORY — PX: TRANSESOPHAGEAL ECHOCARDIOGRAM (CATH LAB): EP1270

## 2023-03-13 HISTORY — DX: Nonrheumatic aortic (valve) stenosis: I35.0

## 2023-03-13 LAB — POCT I-STAT, CHEM 8
BUN: 28 mg/dL — ABNORMAL HIGH (ref 8–23)
Calcium, Ion: 1.18 mmol/L (ref 1.15–1.40)
Chloride: 107 mmol/L (ref 98–111)
Creatinine, Ser: 0.9 mg/dL (ref 0.44–1.00)
Glucose, Bld: 149 mg/dL — ABNORMAL HIGH (ref 70–99)
HCT: 29 % — ABNORMAL LOW (ref 36.0–46.0)
Hemoglobin: 9.9 g/dL — ABNORMAL LOW (ref 12.0–15.0)
Potassium: 3.2 mmol/L — ABNORMAL LOW (ref 3.5–5.1)
Sodium: 140 mmol/L (ref 135–145)
TCO2: 28 mmol/L (ref 22–32)

## 2023-03-13 LAB — ECHO TEE
AR max vel: 2.83 cm2
AV Area VTI: 2.68 cm2
AV Area mean vel: 3.14 cm2
AV Mean grad: 2 mmHg
AV Peak grad: 4.5 mmHg
Ao pk vel: 1.06 m/s

## 2023-03-13 LAB — ABO/RH: ABO/RH(D): A POS

## 2023-03-13 LAB — POCT ACTIVATED CLOTTING TIME: Activated Clotting Time: 312 s

## 2023-03-13 SURGERY — TRANSCATHETER AORTIC VALVE REPLACEMENT,CAROTID (CATHLAB)
Anesthesia: General

## 2023-03-13 MED ORDER — FENTANYL CITRATE (PF) 100 MCG/2ML IJ SOLN
INTRAMUSCULAR | Status: DC | PRN
  Administered 2023-03-13: 100 ug via INTRAVENOUS

## 2023-03-13 MED ORDER — PHENYLEPHRINE 80 MCG/ML (10ML) SYRINGE FOR IV PUSH (FOR BLOOD PRESSURE SUPPORT)
PREFILLED_SYRINGE | INTRAVENOUS | Status: DC | PRN
  Administered 2023-03-13: 80 ug via INTRAVENOUS

## 2023-03-13 MED ORDER — PANTOPRAZOLE SODIUM 40 MG PO TBEC
40.0000 mg | DELAYED_RELEASE_TABLET | Freq: Every day | ORAL | Status: DC
  Administered 2023-03-13: 40 mg via ORAL
  Filled 2023-03-13: qty 1

## 2023-03-13 MED ORDER — FENTANYL CITRATE (PF) 100 MCG/2ML IJ SOLN
INTRAMUSCULAR | Status: AC
  Filled 2023-03-13: qty 2

## 2023-03-13 MED ORDER — CLEVIDIPINE BUTYRATE 0.5 MG/ML IV EMUL
INTRAVENOUS | Status: AC
  Filled 2023-03-13: qty 50

## 2023-03-13 MED ORDER — NITROGLYCERIN 0.4 MG SL SUBL
0.4000 mg | SUBLINGUAL_TABLET | SUBLINGUAL | Status: DC | PRN
Start: 2023-03-13 — End: 2023-03-14

## 2023-03-13 MED ORDER — SODIUM CHLORIDE 0.9% FLUSH
3.0000 mL | Freq: Two times a day (BID) | INTRAVENOUS | Status: DC
  Administered 2023-03-13 – 2023-03-14 (×2): 3 mL via INTRAVENOUS

## 2023-03-13 MED ORDER — IODIXANOL 320 MG/ML IV SOLN
INTRAVENOUS | Status: DC | PRN
  Administered 2023-03-13: 55 mL

## 2023-03-13 MED ORDER — LACTATED RINGERS IV SOLN
INTRAVENOUS | Status: DC | PRN
Start: 2023-03-13 — End: 2023-03-13

## 2023-03-13 MED ORDER — CHLORHEXIDINE GLUCONATE 0.12 % MT SOLN
15.0000 mL | Freq: Once | OROMUCOSAL | Status: AC
  Administered 2023-03-13: 15 mL via OROMUCOSAL
  Filled 2023-03-13: qty 15

## 2023-03-13 MED ORDER — ENALAPRIL MALEATE 10 MG PO TABS: 10.0000 mg | ORAL_TABLET | Freq: Every day | ORAL | Status: DC

## 2023-03-13 MED ORDER — HEPARIN (PORCINE) IN NACL 1000-0.9 UT/500ML-% IV SOLN
INTRAVENOUS | Status: DC | PRN
  Administered 2023-03-13 (×2): 500 mL

## 2023-03-13 MED ORDER — NITROGLYCERIN 0.2 MG/ML ON CALL CATH LAB
INTRAVENOUS | Status: DC | PRN
  Administered 2023-03-13: 80 ug via INTRAVENOUS
  Administered 2023-03-13: 40 ug via INTRAVENOUS
  Administered 2023-03-13 (×5): 20 ug via INTRAVENOUS
  Administered 2023-03-13: 40 ug via INTRAVENOUS
  Administered 2023-03-13: 80 ug via INTRAVENOUS

## 2023-03-13 MED ORDER — TRAMADOL HCL 50 MG PO TABS: 50.0000 mg | ORAL_TABLET | ORAL | Status: DC | PRN

## 2023-03-13 MED ORDER — PROPOFOL 10 MG/ML IV BOLUS
INTRAVENOUS | Status: DC | PRN
  Administered 2023-03-13: 20 mg via INTRAVENOUS
  Administered 2023-03-13: 70 mg via INTRAVENOUS
  Administered 2023-03-13: 30 mg via INTRAVENOUS

## 2023-03-13 MED ORDER — NITROGLYCERIN IN D5W 200-5 MCG/ML-% IV SOLN: 0.0000 ug/min | INTRAVENOUS | Status: DC

## 2023-03-13 MED ORDER — SERTRALINE HCL 100 MG PO TABS
100.0000 mg | ORAL_TABLET | Freq: Every day | ORAL | Status: DC
  Administered 2023-03-13 – 2023-03-14 (×2): 100 mg via ORAL
  Filled 2023-03-13 (×2): qty 1

## 2023-03-13 MED ORDER — POTASSIUM CHLORIDE CRYS ER 20 MEQ PO TBCR
40.0000 meq | EXTENDED_RELEASE_TABLET | Freq: Once | ORAL | Status: AC
  Administered 2023-03-13: 40 meq via ORAL
  Filled 2023-03-13: qty 2

## 2023-03-13 MED ORDER — CEFAZOLIN SODIUM-DEXTROSE 2-4 GM/100ML-% IV SOLN
2.0000 g | Freq: Three times a day (TID) | INTRAVENOUS | Status: AC
  Administered 2023-03-13 (×2): 2 g via INTRAVENOUS
  Filled 2023-03-13 (×2): qty 100

## 2023-03-13 MED ORDER — CLEVIDIPINE BUTYRATE 0.5 MG/ML IV EMUL
INTRAVENOUS | Status: DC | PRN
  Administered 2023-03-13: 2 mg/h via INTRAVENOUS

## 2023-03-13 MED ORDER — SUGAMMADEX SODIUM 200 MG/2ML IV SOLN
INTRAVENOUS | Status: DC | PRN
  Administered 2023-03-13: 300 mg via INTRAVENOUS

## 2023-03-13 MED ORDER — ALPRAZOLAM 0.5 MG PO TABS: 1.0000 mg | ORAL_TABLET | Freq: Every day | ORAL | Status: DC

## 2023-03-13 MED ORDER — SODIUM CHLORIDE 0.9 % IV SOLN: 250.0000 mL | INTRAVENOUS | Status: DC | PRN

## 2023-03-13 MED ORDER — EZETIMIBE 10 MG PO TABS
10.0000 mg | ORAL_TABLET | Freq: Every day | ORAL | Status: DC
  Administered 2023-03-13 – 2023-03-14 (×2): 10 mg via ORAL
  Filled 2023-03-13 (×2): qty 1

## 2023-03-13 MED ORDER — CHLORHEXIDINE GLUCONATE 4 % EX SOLN: 60.0000 mL | Freq: Once | CUTANEOUS | Status: DC

## 2023-03-13 MED ORDER — HEPARIN SODIUM (PORCINE) 1000 UNIT/ML IJ SOLN
INTRAMUSCULAR | Status: AC
  Filled 2023-03-13: qty 20

## 2023-03-13 MED ORDER — ACETAMINOPHEN 650 MG RE SUPP: 650.0000 mg | Freq: Four times a day (QID) | RECTAL | Status: DC | PRN

## 2023-03-13 MED ORDER — LIDOCAINE 2% (20 MG/ML) 5 ML SYRINGE
INTRAMUSCULAR | Status: DC | PRN
  Administered 2023-03-13: 100 mg via INTRAVENOUS

## 2023-03-13 MED ORDER — ONDANSETRON HCL 4 MG/2ML IJ SOLN
INTRAMUSCULAR | Status: DC | PRN
  Administered 2023-03-13: 4 mg via INTRAVENOUS

## 2023-03-13 MED ORDER — SODIUM CHLORIDE 0.9 % IV SOLN
INTRAVENOUS | Status: DC
Start: 2023-03-14 — End: 2023-03-13

## 2023-03-13 MED ORDER — AMLODIPINE BESYLATE 5 MG PO TABS
5.0000 mg | ORAL_TABLET | Freq: Every day | ORAL | Status: DC
Start: 2023-03-13 — End: 2023-03-14
  Filled 2023-03-13: qty 1

## 2023-03-13 MED ORDER — ISOSORBIDE MONONITRATE ER 30 MG PO TB24
15.0000 mg | ORAL_TABLET | Freq: Every day | ORAL | Status: DC
Start: 2023-03-13 — End: 2023-03-13
  Administered 2023-03-13: 15 mg via ORAL
  Filled 2023-03-13: qty 1

## 2023-03-13 MED ORDER — OXYCODONE HCL 5 MG PO TABS: 5.0000 mg | ORAL_TABLET | ORAL | Status: DC | PRN

## 2023-03-13 MED ORDER — CHLORHEXIDINE GLUCONATE 4 % EX SOLN: 30.0000 mL | CUTANEOUS | Status: DC

## 2023-03-13 MED ORDER — ROCURONIUM BROMIDE 10 MG/ML (PF) SYRINGE
PREFILLED_SYRINGE | INTRAVENOUS | Status: DC | PRN
  Administered 2023-03-13: 100 mg via INTRAVENOUS

## 2023-03-13 MED ORDER — ALPRAZOLAM 0.5 MG PO TABS
1.0000 mg | ORAL_TABLET | Freq: Four times a day (QID) | ORAL | Status: DC | PRN
  Administered 2023-03-13 (×2): 1 mg via ORAL
  Filled 2023-03-13 (×2): qty 2

## 2023-03-13 MED ORDER — CLOPIDOGREL BISULFATE 75 MG PO TABS
75.0000 mg | ORAL_TABLET | Freq: Every day | ORAL | Status: DC
Start: 2023-03-14 — End: 2023-03-14
  Administered 2023-03-14: 75 mg via ORAL
  Filled 2023-03-13: qty 1

## 2023-03-13 MED ORDER — PROTAMINE SULFATE 10 MG/ML IV SOLN
INTRAVENOUS | Status: DC | PRN
  Administered 2023-03-13: 50 mg via INTRAVENOUS
  Administered 2023-03-13: 20 mg via INTRAVENOUS

## 2023-03-13 MED ORDER — DEXAMETHASONE SODIUM PHOSPHATE 10 MG/ML IJ SOLN
INTRAMUSCULAR | Status: DC | PRN
  Administered 2023-03-13: 4 mg via INTRAVENOUS

## 2023-03-13 MED ORDER — ACETAMINOPHEN 325 MG PO TABS
ORAL_TABLET | ORAL | Status: AC
Start: 2023-03-13 — End: 2023-03-13
  Filled 2023-03-13: qty 2

## 2023-03-13 MED ORDER — SODIUM CHLORIDE 0.9 % IV SOLN: INTRAVENOUS | Status: AC

## 2023-03-13 MED ORDER — MORPHINE SULFATE (PF) 2 MG/ML IV SOLN: 1.0000 mg | INTRAVENOUS | Status: DC | PRN

## 2023-03-13 MED ORDER — HEPARIN SODIUM (PORCINE) 1000 UNIT/ML IJ SOLN
INTRAMUSCULAR | Status: DC | PRN
  Administered 2023-03-13: 10000 [IU] via INTRAVENOUS

## 2023-03-13 MED ORDER — ACETAMINOPHEN 325 MG PO TABS
650.0000 mg | ORAL_TABLET | Freq: Four times a day (QID) | ORAL | Status: DC | PRN
  Administered 2023-03-13 – 2023-03-14 (×2): 650 mg via ORAL
  Filled 2023-03-13: qty 2

## 2023-03-13 MED ORDER — ONDANSETRON HCL 4 MG/2ML IJ SOLN: 4.0000 mg | Freq: Four times a day (QID) | INTRAMUSCULAR | Status: DC | PRN

## 2023-03-13 MED ORDER — SODIUM CHLORIDE 0.9% FLUSH: 3.0000 mL | INTRAVENOUS | Status: DC | PRN

## 2023-03-13 SURGICAL SUPPLY — 29 items
BAG SNAP BAND KOVER 36X36 (MISCELLANEOUS) ×4 IMPLANT
CABLE ADAPT PACING TEMP 12FT (ADAPTER) IMPLANT
CATH 23 ULTRA DELIVERY (CATHETERS) IMPLANT
CATH DIAG 6FR PIGTAIL ANGLED (CATHETERS) IMPLANT
CATH INFINITI 5FR ANG PIGTAIL (CATHETERS) IMPLANT
CATH INFINITI 6F AL1 (CATHETERS) IMPLANT
CATH S G BIP PACING (CATHETERS) IMPLANT
CLOSURE MYNX CONTROL 6F/7F (Vascular Products) IMPLANT
CRIMPER (MISCELLANEOUS) IMPLANT
DEVICE INFLATION ATRION QL2530 (MISCELLANEOUS) IMPLANT
ELECT DEFIB PAD ADLT CADENCE (PAD) IMPLANT
KIT MICROPUNCTURE NIT STIFF (SHEATH) IMPLANT
KIT SAPIAN 3 ULTRA RESILIA 23 (Valve) IMPLANT
KIT SINGLE USE MANIFOLD (KITS) IMPLANT
KIT SYRINGE INJ CVI SPIKEX1 (MISCELLANEOUS) IMPLANT
PACK CARDIAC CATHETERIZATION (CUSTOM PROCEDURE TRAY) ×2 IMPLANT
SET ATX-X65L (MISCELLANEOUS) IMPLANT
SHEATH BRITE TIP 7FR 35CM (SHEATH) IMPLANT
SHEATH INTRODUCER SET 20-26 (SHEATH) IMPLANT
SHEATH PINNACLE 6F 10CM (SHEATH) IMPLANT
SHEATH PINNACLE 8F 10CM (SHEATH) IMPLANT
SHIELD CATH-GARD CONTAMINATION (MISCELLANEOUS) IMPLANT
STOPCOCK MORSE 400PSI 3WAY (MISCELLANEOUS) ×4 IMPLANT
TUBING ART PRESS 72 MALE/FEM (TUBING) IMPLANT
WIRE EMERALD 3MM-J .035X150CM (WIRE) IMPLANT
WIRE EMERALD 3MM-J .035X260CM (WIRE) IMPLANT
WIRE EMERALD ST .035X260CM (WIRE) IMPLANT
WIRE HI TORQ VERSACORE-J 145CM (WIRE) IMPLANT
WIRE SAFARI SM CURVE 275 (WIRE) IMPLANT

## 2023-03-13 NOTE — Anesthesia Procedure Notes (Signed)
 Arterial Line Insertion Start/End3/11/2023 7:20 AM Performed by: Alease Medina, CRNA, CRNA  Patient location: Pre-op. Preanesthetic checklist: patient identified, IV checked, site marked, risks and benefits discussed, surgical consent, monitors and equipment checked, pre-op evaluation, timeout performed and anesthesia consent Lidocaine 1% used for infiltration Right, radial was placed Catheter size: 20 G Hand hygiene performed  and maximum sterile barriers used   Attempts: 2 Procedure performed using ultrasound guided technique. Following insertion, dressing applied and Biopatch. Post procedure assessment: normal and unchanged  Patient tolerated the procedure well with no immediate complications.

## 2023-03-13 NOTE — Op Note (Signed)
 [facility]   MULTIDISCIPLINARY HEART VALVE TEAM   TAVR OPERATIVE NOTE   Date of Procedure:  03/13/2023  Preoperative Diagnosis: Severe Aortic Stenosis   Postoperative Diagnosis: Same   Procedure:   Transcatheter Aortic Valve Replacement - Right common carotid artery approach  Edwards Sapien 3 Ultra Resilia THV (size 23 mm, model # 9755RSL, serial # [id number])   Co-Surgeons:  Alleen Borne, MD and Tonny Bollman, MD   Anesthesiologist:  Leisa Lenz, MD  Echocardiographer:  P. Eden Emms, MD  Pre-operative Echo Findings: Severe aortic stenosis Normal left ventricular systolic function  Post-operative Echo Findings: Trivial paravalvular leak Normal left ventricular systolic function   BRIEF CLINICAL NOTE AND INDICATIONS FOR SURGERY  This 78 year old woman has stage D, severe, symptomatic aortic stenosis with NYHA class II symptoms of exertional fatigue and shortness of breath as well as tightness in her throat and jaw consistent with chronic diastolic congestive heart failure.  I have personally reviewed her 2D echocardiogram and CTA studies.  Her echocardiogram showed a calcified aortic valve with restricted leaflet mobility with a mean gradient of 38 mmHg, dimensionless index of 0.23, and a valve area of 0.8 cm consistent with severe aortic stenosis.  Left ventricular ejection fraction is 55 to 60%.  It was not felt that she required cardiac catheterization since her bypass grafts to the LAD and left circumflex appeared patent on her gated cardiac CTA and previous catheterization in June 2023 showed chronic total illusion of the left main, left circumflex, and diagonal branch with known occlusion of the vein graft to the diagonal.  I agree that aortic valve replacement is indicated in this patient for relief of her symptoms and to prevent left ventricular deterioration.  Given her age and prior coronary bypass surgery with multiple comorbidities I think the best  option for treating her would be transcatheter aortic valve replacement.  Her gated cardiac CT shows patent bypass grafts with anatomy suitable for TAVR using a 23-26 mm SAPIEN 3 valve.  Her abdominal and pelvic CTA shows severe diffuse aortoiliac calcific atherosclerosis that would preclude safe transfemoral insertion.  She also has a heavily calcified left subclavian artery that does not appear adequate for insertion.  Her left carotid artery appears suitable for insertion.   The patient and her daughter were counseled at length regarding treatment alternatives for management of severe symptomatic aortic stenosis. The risks and benefits of surgical intervention has been discussed in detail. Long-term prognosis with medical therapy was discussed. Alternative approaches such as conventional surgical aortic valve replacement, transcatheter aortic valve replacement, and palliative medical therapy were compared and contrasted at length. This discussion was placed in the context of the patient's own specific clinical presentation and past medical history. All of their questions have been addressed.    Following the decision to proceed with transcatheter aortic valve replacement, a discussion was held regarding what types of management strategies would be attempted intraoperatively in the event of life-threatening complications, including whether or not the patient would be considered a candidate for the use of cardiopulmonary bypass and/or conversion to open sternotomy for attempted surgical intervention.  I do not think she is a candidate for open surgical aortic valve replacement and is not a candidate for emergent sternotomy to manage any intraoperative complications given her history of prior coronary bypass surgery with significant aortic calcification.  The patient has been advised of a variety of complications that might develop including but not limited to risks of death, stroke, paravalvular leak,  aortic  dissection or other major vascular complications, aortic annulus rupture, device embolization, cardiac rupture or perforation, mitral regurgitation, acute myocardial infarction, arrhythmia, heart block or bradycardia requiring permanent pacemaker placement, congestive heart failure, respiratory failure, renal failure, pneumonia, infection, other late complications related to structural valve deterioration or migration, or other complications that might ultimately cause a temporary or permanent loss of functional independence or other long term morbidity. The patient provides full informed consent for the procedure as described and all questions were answered.       DETAILS OF THE OPERATIVE PROCEDURE  PREPARATION:    The patient was brought to the operating room on the above mentioned date and appropriate monitoring was established by the anesthesia team. The patient was placed in the supine position on the operating table.  Intravenous antibiotics were administered.  General endotracheal anesthesia was induced uneventfully.  Baseline transesophageal echocardiogram was performed. The patient's abdomen and both groins were prepped and draped in a sterile manner. A time out procedure was performed.   PERIPHERAL ACCESS:    Using the modified Seldinger technique, femoral arterial and venous access was obtained with placement of 6 Fr sheaths on the right side.  A pigtail diagnostic catheter was passed through the right arterial sheath under fluoroscopic guidance into the aortic root.  A temporary transvenous pacemaker catheter was passed through the right femoral venous sheath under fluoroscopic guidance into the right ventricle.  The pacemaker was tested to ensure stable lead placement and pacemaker capture. Aortic root angiography was performed in order to determine the optimal angiographic angle for valve deployment.   LEFT CAROTID ACCESS:   A transverse incision was made at the base of the left  neck between the heads of the sternocleidomastoid muscle and carried down through the subcutaneous tissue using electrocautery. The platysma muscle was divided. The carotid sheath was opened and the common carotid artery identified. The patient was heparinized systemically and ACT verified > 250 seconds.  A double concentric purse string suture of CV-4 gortex was placed in the anterior wall of the artery. The artery was cannulated with a needle and a J- wire advanced into the ascending aorta. An 8 F sheath was inserted over the wire. The aortic valve prosthesis was crossed with an AL-1 catheter and a straight wire. This was exchanged for a pigtail catheter and position was confirmed in the LV apex.  The pigtail catheter was exchanged for a Safari wire in the LV apex.  Then a 41F E-sheath was inserted into the carotid artery and the tip advanced into the aortic arch.   BALLOON AORTIC VALVULOPLASTY:   Not performed   TRANSCATHETER HEART VALVE DEPLOYMENT:   An Edwards Sapien 3 Ultra Resilia transcatheter heart valve (size 23 mm) was prepared and crimped per manufacturer's guidelines, and the proper orientation of the valve is confirmed on the [employer] delivery system. The valve was advanced through the introducer sheath using normal technique until in an appropriate position in the ascending aorta beyond the sheath tip. The balloon was then retracted and using the fine-tuning wheel was centered on the valve. The valve was carefully positioned across the aortic valve annulus. The Commander catheter was retracted using normal technique. Once final position of the valve has been confirmed by angiographic assessment, the valve is deployed during rapid ventricular pacing to maintain systolic blood pressure < 50 mmHg and pulse pressure < 10 mmHg. The balloon inflation is held for >3 seconds after reaching full deployment volume. Once the balloon has  fully deflated the balloon is retracted into the ascending  aorta and valve function is assessed using echocardiography. There is felt to be trivial paravalvular leak and no central aortic insufficiency.  The patient's hemodynamic recovery following valve deployment is good.  The deployment balloon and guidewire are both removed.    PROCEDURE COMPLETION:   The sheath was removed and carotid artery closure performed.  Protamine was administered once carotid arterial repair was complete. The neck incision was closed with 3-0 Vicryl continuous suture for the platysma muscle and 3-0 Vicryl subcuticular skin closure. The temporary pacemaker, pigtail catheter and femoral sheaths were removed with manual pressure used for venous hemostasis.  A Mynx femoral closure device was utilized following removal of the diagnostic sheath in the right femoral artery.  The patient tolerated the procedure well and is transported to the cath lab recovery area in stable condition. There were no immediate intraoperative complications. All sponge instrument and needle counts are verified correct at completion of the operation.   No blood products were administered during the operation.  The patient received a total of 55 mL of intravenous contrast during the procedure.   Alleen Borne, MD 03/13/2023 9:45 AM

## 2023-03-13 NOTE — Discharge Instructions (Signed)

## 2023-03-13 NOTE — Progress Notes (Signed)
  [facility]   MULTIDISCIPLINARY HEART VALVE TEAM  Patient doing well s/p TAVR. She is hemodynamically stable. Groin/carotid sites stable. ECG with sinus vs accelerated junctional rhythm (a lot of baseline artifact) but no high grade block.  K noted to be 3.2 and given one dose of Kdur . Transferred from cath lab holding to 4E. Early ambulation after bedrest completed and hopeful discharge over the next 24-48 hours.   Cline Crock PA-C  [facility]  Pager [phone]

## 2023-03-13 NOTE — Op Note (Signed)
 [facility]   MULTIDISCIPLINARY HEART VALVE TEAM   TAVR OPERATIVE NOTE   Date of Procedure:  03/13/2023  Preoperative Diagnosis: Severe Aortic Stenosis   Postoperative Diagnosis: Same   Procedure:   Transcatheter Aortic Valve Replacement - Transcarotid Approach  Edwards Sapien 3 Ultra Resilia THV (size 23 mm, serial # [id number] )   Co-Surgeons:  Evelene Croon, MD and Tonny Bollman, MD  Anesthesiologist:  Roslynn Amble, MD  Echocardiographer:  Charlton Haws, MD  Pre-operative Echo Findings: Severe aortic stenosis Normal left ventricular systolic function  Post-operative Echo Findings: Trivial paravalvular leak Normal/unchanged left ventricular systolic function  BRIEF CLINICAL NOTE AND INDICATIONS FOR SURGERY  78 year old woman who has developed severe, symptomatic aortic stenosis (stage D1).  She has significant comorbidities with remote CABG in 1994, saphenous vein graft PCI in 2019, left subclavian stenting, and right iliac stenting.  After multidisciplinary review of her case, we elected to proceed with TAVR via a left carotid approach as she did not have suitable access for transfemoral valve insertion.  During the course of the patient's preoperative work up they have been evaluated comprehensively by a multidisciplinary team of specialists coordinated through the Multidisciplinary Heart Valve Clinic in the [facility].  They have been demonstrated to suffer from symptomatic severe aortic stenosis as noted above. The patient has been counseled extensively as to the relative risks and benefits of all options for the treatment of severe aortic stenosis including long term medical therapy, conventional surgery for aortic valve replacement, and transcatheter aortic valve replacement.  The patient has been independently evaluated in formal cardiac surgical consultation by Dr Laneta Simmers, who deemed the patient appropriate for TAVR. Based upon review  of all of the patient's preoperative diagnostic tests they are felt to be candidate for transcatheter aortic valve replacement using the transcarotid approach as an alternative to conventional surgery.    Following the decision to proceed with transcatheter aortic valve replacement, a discussion has been held regarding what types of management strategies would be attempted intraoperatively in the event of life-threatening complications, including whether or not the patient would be considered a candidate for the use of cardiopulmonary bypass and/or conversion to open sternotomy for attempted surgical intervention.  The patient has been advised of a variety of complications that might develop peculiar to this approach including but not limited to risks of death, stroke, paravalvular leak, aortic dissection or other major vascular complications, aortic annulus rupture, device embolization, cardiac rupture or perforation, acute myocardial infarction, arrhythmia, heart block or bradycardia requiring permanent pacemaker placement, congestive heart failure, respiratory failure, renal failure, pneumonia, infection, other late complications related to structural valve deterioration or migration, or other complications that might ultimately cause a temporary or permanent loss of functional independence or other long term morbidity.  The patient provides full informed consent for the procedure as described and all questions were answered preoperatively.  DETAILS OF THE OPERATIVE PROCEDURE  PREPARATION:   The patient is brought to the operating room on the above mentioned date and central monitoring was established by the anesthesia team including placement of a radial arterial line. The patient is placed in the supine position on the operating table.  Intravenous antibiotics are administered. General endotracheal anesthesia is induced uneventfully.    Baseline transesophageal echocardiogram is performed. The  patient's chest, abdomen, both groins, and both lower extremities are prepared and draped in a sterile manner. A time out procedure is performed.   PERIPHERAL ACCESS:   Using ultrasound  guidance, femoral arterial and venous access is obtained with placement of 6 Fr sheaths on the right side.  [place] images are digitally captured and stored in the patient's chart. A pigtail diagnostic catheter was passed through the femoral arterial sheath under fluoroscopic guidance into the aortic root.  A temporary transvenous pacemaker catheter was passed through the femoral venous sheath under fluoroscopic guidance into the right ventricle.  The pacemaker was tested to ensure stable lead placement and pacemaker capture. Aortic root angiography was performed in order to determine the optimal angiographic angle for valve deployment.  TRANSCAROTID ACCESS:  Please see the full operative report of Dr. Laneta Simmers for his exposure of the left carotid artery.  After the artery is exposed and the patient is fully heparinized, an 8 [place] sheath is placed.  An AL-1 catheter and straight tip wire were used to cross the valve.  This is changed out for a safari wire over a pigtail catheter.  With the safari wire positioned in the left ventricular apex, a 14 [place] E-Sheath is inserted.    BALLOON AORTIC VALVULOPLASTY:  Not performed  TRANSCATHETER HEART VALVE DEPLOYMENT:  An Edwards Sapien 3 Ultra Resilia transcatheter heart valve (size 23 mm) was prepared and crimped per manufacturer's guidelines, and the proper orientation of the valve is confirmed on the [employer] delivery system. The valve was advanced through the introducer sheath using normal technique until in an appropriate position in the ascending aorta beyond the sheath tip. The balloon was then retracted and using the fine-tuning wheel was centered on the valve. The valve was carefully positioned across the aortic valve annulus. The Commander catheter was retracted  using normal technique. Once final position of the valve has been confirmed by angiographic assessment, the valve is deployed while temporarily holding ventilation and during rapid ventricular pacing to maintain systolic blood pressure < 50 mmHg and pulse pressure < 10 mmHg. The balloon inflation is held for >3 seconds after reaching full deployment volume. Once the balloon has fully deflated the balloon is retracted into the ascending aorta and valve function is assessed using transesophageal echocardiography. The patient's hemodynamic recovery following valve deployment is good.  The deployment balloon and guidewire are both removed. Echo demostrated acceptable post-procedural gradients, stable mitral valve function, and trace aortic insufficiency.    PROCEDURE COMPLETION:  Please see the complete note of Dr. Laneta Simmers for details of carotid artery repair and closure.  Protamine is administered once carotid arterial repair was complete.  The temporary pacemaker and pigtail catheters are removed. Mynx closure is used for contralateral femoral arterial hemostasis for the 6 Fr sheath.  The patient tolerated the procedure well and is transported to the recovery area in stable condition. There were no immediate intraoperative complications. All sponge instrument and needle counts are verified correct at completion of the operation.   The patient received a total of 55 mL of intravenous contrast during the procedure.  EBL: minimal  LVEDP: not recorded   Tonny Bollman, MD 03/13/2023 9:32 AM

## 2023-03-13 NOTE — Anesthesia Procedure Notes (Signed)
 Procedure Name: Intubation Date/Time: 03/13/2023 7:43 AM  Performed by: [name], MDPre-anesthesia Checklist: Patient identified, Emergency Drugs available, Suction available and Patient being monitored Patient Re-evaluated:Patient Re-evaluated prior to induction Oxygen Delivery Method: Circle system utilized Preoxygenation: Pre-oxygenation with 100% oxygen Induction Type: IV induction Ventilation: Mask ventilation without difficulty Tube type: Oral Tube size: 7.0 mm Number of attempts: 1 Airway Equipment and Method: Stylet and Oral airway Placement Confirmation: ETT inserted through vocal cords under direct vision, positive ETCO2 and breath sounds checked- equal and bilateral Secured at: 21 cm Tube secured with: Tape Dental Injury: Teeth and Oropharynx as per pre-operative assessment

## 2023-03-13 NOTE — Progress Notes (Signed)
  Echocardiogram Echocardiogram Transesophageal has been performed.  Dawn Boyer 03/13/2023, 9:22 AM

## 2023-03-13 NOTE — Anesthesia Postprocedure Evaluation (Signed)
 Anesthesia Post Note  Patient: Dawn Boyer  Procedure(s) Performed: Transcatheter Aortic Valve Replacement-Carotid (Left) TRANSESOPHAGEAL ECHOCARDIOGRAM     Patient location during evaluation: PACU Anesthesia Type: General Level of consciousness: awake and alert Pain management: pain level controlled Vital Signs Assessment: post-procedure vital signs reviewed and stable Respiratory status: spontaneous breathing, nonlabored ventilation, respiratory function stable and patient connected to nasal cannula oxygen Cardiovascular status: blood pressure returned to baseline and stable Postop Assessment: no apparent nausea or vomiting Anesthetic complications: no   There were no known notable events for this encounter.  Last Vitals:  Vitals:   03/13/23 1045 03/13/23 1100  BP: 138/65 (!) 140/57  Pulse: 71 68  Resp: 13 18  Temp:    SpO2: 98% 94%    Last Pain:  Vitals:   03/13/23 1100  TempSrc:   PainSc: 2                  [name]

## 2023-03-13 NOTE — Interval H&P Note (Signed)
 History and Physical Interval Note:  03/13/2023 6:28 AM  Dawn Boyer  has presented today for surgery, with the diagnosis of Severe Aortic Stenosis.  The various methods of treatment have been discussed with the patient and family. After consideration of risks, benefits and other options for treatment, the patient has consented to  Procedure(s): Transcatheter Aortic Valve Replacement-Carotid (Left) TRANSESOPHAGEAL ECHOCARDIOGRAM (N/A) as a surgical intervention.  The patient's history has been reviewed, patient examined, no change in status, stable for surgery.  I have reviewed the patient's chart and labs.  Questions were answered to the patient's satisfaction.     Dawn Boyer

## 2023-03-13 NOTE — Transfer of Care (Signed)
 Immediate Anesthesia Transfer of Care Note  Patient: Dawn Boyer  Procedure(s) Performed: Transcatheter Aortic Valve Replacement-Carotid (Left) TRANSESOPHAGEAL ECHOCARDIOGRAM  Patient Location: Cath Lab  Anesthesia Type:General  Level of Consciousness: drowsy and patient cooperative  Airway & Oxygen Therapy: Patient Spontanous Breathing and Patient connected to nasal cannula oxygen  Post-op Assessment: Report given to RN, Post -op Vital signs reviewed and stable, and Patient moving all extremities X 4  Post vital signs: Reviewed and stable  Last Vitals:  Vitals Value Taken Time  BP 167/54   Temp    Pulse 72   Resp 12   SpO2 94     Last Pain:  Vitals:   03/13/23 0628  TempSrc:   PainSc: 0-No pain         Complications: There were no known notable events for this encounter.

## 2023-03-13 NOTE — Discharge Summary (Incomplete)
 HE[facility] VASCULAR CENTER   MULTIDISCIPLINARY HEART VALVE TEAM  Discharge Summary    Patient ID: Dawn Boyer MRN: [id number]; DOB: 1945/02/07  Admit date: 03/13/2023 Discharge date: 03/14/2023  Primary Care Provider: [name], MD  Primary Cardiologist: [name], MD / Dr. Excell Seltzer & Dr. Laneta Simmers (TAVR)  Discharge Diagnoses    Principal Problem:   S/P TAVR (transcatheter aortic valve replacement) Active Problems:   Hyperlipidemia   Hemorrhoids   Essential hypertension, benign   Coronary artery disease   Hypothyroidism   Severe aortic stenosis   Nephrolithiasis   Allergies No Known Allergies  Diagnostic Studies/Procedures    TAVR OPERATIVE NOTE     Date of Procedure:                03/13/2023   Preoperative Diagnosis:      Severe Aortic Stenosis    Postoperative Diagnosis:    Same    Procedure:        Transcatheter Aortic Valve Replacement - Transcarotid Approach             Edwards Sapien 3 Ultra Resilia THV (size 23 mm, serial # [id number] )              Co-Surgeons:                        Evelene Croon, MD and Tonny Bollman, MD   Anesthesiologist:                  Roslynn Amble, MD   Echocardiographer:              Charlton Haws, MD   Pre-operative Echo Findings: Severe aortic stenosis Normal left ventricular systolic function   Post-operative Echo Findings: Trivial paravalvular leak Normal/unchanged left ventricular systolic function  _____________    Echo 03/14/23:  IMPRESSIONS   1. Left ventricular ejection fraction, by estimation, is 65 to 70%. The  left ventricle has normal function. The left ventricle has no regional  wall motion abnormalities. There is mild concentric left ventricular  hypertrophy. Left ventricular diastolic  parameters are consistent with Grade I diastolic dysfunction (impaired  relaxation).   2. Right ventricular systolic function is mildly reduced. The right  ventricular size is mildly enlarged. There is normal pulmonary  artery  systolic pressure. The estimated right ventricular systolic pressure is  30.0 mmHg.   3. Left atrial size was moderately dilated.   4. The mitral valve is normal in structure. Trivial mitral valve  regurgitation. Moderate mitral annular calcification.   5. Trivial perivalvular leak at the 10 o'clock position. The aortic valve  has been repaired/replaced. Aortic valve regurgitation is trivial. There  is a 23 mm Edwards Sapien 3 Ultra valve present in the aortic position.  Procedure Date: 03/13/23. Echo  findings are consistent with normal structure and function of the aortic  valve prosthesis. Aortic valve area, by VTI measures 2.00 cm. Aortic  valve mean gradient measures 10.0 mmHg. Aortic valve Vmax measures 1.93  m/s.   6. Mildly dilated pulmonary artery.   7. The inferior vena cava is normal in size with greater than 50%  respiratory variability, suggesting right atrial pressure of 3 mmHg.   History of Present Illness     Dawn Boyer is a 77 y.o. female with a history of CAD s/p CABG ([facility]KB 1994) s/p SVG stenting (2019), PAD s/p left subclavian artery stenting (2019) & right iliac stenting, HTN, HLD, GERD, anemia, mild thrombocytopenia,  ILD noted on CT scans and severe AS who presented to [facility] on 03/13/23 for planned TAVR.   Echo 08/25/22 showed EF 55% and severe AS with a mean grad 38 mmHg, AVA 0.8 cm2, DVI 0.23 and mild AR/MR. She had a cardiac CTA on 10/02/2022 showing an aortic valve calcium score of 3070, a patent LIMA to the LAD and patent SVG to the obtuse marginal. This also showed severe aortoiliac atherosclerosis and possible ILD. Follow up PFTs were reasonable with FEV1 of 1.98. She reported shortness of breath with activity and generalized fatigue.   The patient was evaluated by the multidisciplinary valve team and felt to have severe, symptomatic aortic stenosis and to be a suitable candidate for TAVR via the transcarotid approach, which was set up for 03/13/23.   Hospital  Course     Consultants: none    Severe AS:  -- S/p successful TAVR with a 23 mm Edwards Sapien 3 Ultra Resilia THV via the left carotid approach on 03/13/23.  -- Post operative echo showed EF 65%, mild RV dysfunction, normally functioning TAVR with a mean gradient of 10 mmHg and trivial PVL as well as moderate MAC with trivial MR. -- Groin/carotid sites are stable.  -- ECG with sinus and no high grade heart block. -- Resume home chronic Plavix 75mg  daily.  -- Met with cardiac rehab to discuss CRP phase II.  -- Plan for discharge home today with close follow up in the outpatient setting.   HTN: -- BPs have been soft 102/38. -- Resume home Imdur 15mg  daily and Toprol XL 50mg  BID -- Hold home Norvasc 5mg  daily and Enalapril 10mg  daily until seen in office next week.   Fever:  -- Tmax 102.  -- Blood cultures ordered and in progress.  -- No s/s infection. White count normal. -- Pt feels better and would like to go home.   CAD s/p CABG: -- Recent cardiac CT with patent LIMA to the LAD and patent SVG to the obtuse marginal. -- Continued on home Plavix 75mg  daily. -- Refuses statin therapy.  PAD: -- S/p SVG stenting (2019), PAD s/p left subclavian artery stenting (2019) & right iliac stenting. -- Continue Plavix 75mg  daily.  -- Refuses statin therapy.  Hypokalemia/hypomagnesemia: -- Repleted with Kdur and Magox.   ILD: -- Noted on pre TAVR CTs. -- Follow up PFTs with FEV1 of 1.98.  Bile duct lesion: -- Pre TAVR CTs showed a new small soft tissue density filling defect of the distal common bile duct measuring 4 mm, may represent a stone or neoplasm.  -- Recommend contrast-enhanced MRCP for further evaluation.  -- She has previously declined further work up.  -- This will be discussed again in the outpatient setting.   Pulmonary nodule:  -- Pre TAVR CTs showed a solid subpleural nodule of the left lower lobe measuring 6 mm.  -- Non-contrast chest CT at 6-12 months is  recommended. If the nodule is stable at time of repeat CT, then future CT at 18-24 months (from today's scan) is considered optional for low-risk patients, but is recommended for high-risk patients.  -- She has previously declined further work up.  -- This will be discussed again in the outpatient setting.   Compression deformities: -- Pre TAVR CTs showed an age-indeterminate severe compression deformity of T8 and mild age-indeterminate L1 and L3 compression deformities.  -- Correlate for point tenderness.  -- She has previously declined further work up.  -- This will be discussed again in the  outpatient setting.  _____________  Discharge Vitals Blood pressure (!) 102/38, pulse 75, temperature 98.4 F (36.9 C), temperature source Oral, resp. rate 20, height 5\' 4"  (1.626 m), weight 52.3 kg, SpO2 98%.  Filed Weights   03/13/23 0545 03/14/23 0358  Weight: 53.1 kg 52.3 kg    GEN: Well nourished, well developed, in no acute distress HEENT: normal Neck: no JVD or masses Cardiac: RRR; no murmurs, rubs, or gallops,no edema  Respiratory:  clear to auscultation bilaterally, normal work of breathing GI: soft, nontender, nondistended, + BS MS: no deformity or atrophy Skin: warm and dry, no rash.  Groin/carotid sites clear without hematoma. Mild ecchymosis at carotid site Neuro:  Alert and Oriented x 3, Strength and sensation are intact Psych: euthymic mood, full affect   Disposition   Pt is being discharged home today in good condition.  Follow-up Plans & Appointments     Follow-up Information     Janetta Hora, PA-C. Go on 03/21/2023.   Specialties: Cardiology, Radiology Why: @ 1:30pm, please arrive at least 15 minutes early Contact information: [address] [place] [address] [phone]                 Discharge Medications   Allergies as of 03/14/2023   No Known Allergies      Medication List     PAUSE taking these medications     amLODipine 5 MG tablet Wait to take this until your doctor or other care provider tells you to start again. Commonly known as: NORVASC Take 1 tablet (5 mg total) by mouth daily.   enalapril 10 MG tablet Wait to take this until your doctor or other care provider tells you to start again. Commonly known as: VASOTEC Take 10 mg by mouth daily.       TAKE these medications    ALPRAZolam 1 MG tablet Commonly known as: XANAX Take 1 mg by mouth at bedtime.   b complex vitamins capsule Take 1 capsule by mouth daily.   CHOLESTEROL SUPPORT PO Take 5 capsules by mouth daily.   clopidogrel 75 MG tablet Commonly known as: PLAVIX TAKE 1 TABLET EVERY DAY   Coenzyme Q10 100 MG capsule Take 200 mg by mouth daily.   ezetimibe 10 MG tablet Commonly known as: ZETIA TAKE 1 TABLET EVERY DAY   FLEET ENEMA RE Place 1 enema rectally 2 (two) times daily.   isosorbide mononitrate 30 MG 24 hr tablet Commonly known as: IMDUR Take 1/2 tablet (15 mg total) by mouth daily.   L-Theanine 200 MG Caps Take 200 mg by mouth daily.   linaclotide 290 MCG Caps capsule Commonly known as: LINZESS Take 290 mcg by mouth daily as needed (constipation).   MAGNESIUM COMPLEX HIGH POTENCY PO Take 2 tablets by mouth daily.   metoprolol succinate 50 MG 24 hr tablet Commonly known as: TOPROL-XL Take 1 tablet (50 mg total) by mouth 2 (two) times daily. Take with or immediately following a meal.   multivitamin with minerals tablet Take 1 tablet by mouth daily.   nitroGLYCERIN 0.4 MG SL tablet Commonly known as: NITROSTAT Place 1 tablet (0.4 mg total) under the tongue every 5 (five) minutes as needed for chest pain. PLACE ONE TABLET UNDER THE TONGUE EVERY 5 MINUTES FOR THREE DOSES AS NEEDED FOR CHEST PAIN   OVER THE COUNTER MEDICATION Take 1 tablet by mouth daily. Factor Advanced Formula   pantoprazole 40 MG tablet Commonly known as: PROTONIX Take 40 mg by  mouth at bedtime.   PRESCRIPTION  MEDICATION PATIENT DOES NOT TAKE ANY VACCINES   sertraline 100 MG tablet Commonly known as: ZOLOFT Take 100 mg by mouth daily.   VITAMIN K2-VITAMIN D3 PO Take 2 tablets by mouth daily.          Outstanding Labs/Studies   none  ______________________  Duration of Discharge Encounter: APP Time: 20 minutes    Signed, Cline Crock, PA-C 03/14/2023, 10:31 AM [phone]  Patient seen, examined. Available data reviewed. Agree with findings, assessment, and plan as outlined by Carlean Jews, PA-C.  The patient is independently interviewed and examined.  Her daughter is at the bedside.  She is alert, oriented, no distress.  HEENT is normal, left carotid incision is intact with no surrounding erythema or hematoma.  Lungs are clear bilaterally, heart is regular rate and rhythm with a 2/6 early peaking ejection murmur at the right upper sternal border, abdomen is soft and nontender, right groin site is clear with no hematoma or ecchymosis, lower extremities have no edema, skin is warm and dry with no rash.  The patient's telemetry is reviewed and shows sinus rhythm with no high-grade AV block.  The patient's postoperative day #1 echo is reviewed and shows normal function of her TAVR bioprosthesis with LVEF greater than 65%, mean transvalvular gradient 10 mmHg, and trivial paravalvular regurgitation.  The patient has done very well with TAVR.  She did have a fever overnight and has no ongoing infectious symptoms.  Blood cultures were drawn and are currently pending.  I think she is stable for hospital discharge and her temperature can be monitored as an outpatient.  She has a good support system with her daughter.  All of her questions were answered.  MD Time reviewing labs, telemetry, vitals, echo images, and examining patient with counseling of patient and daughter on procedural results and instructions = 25 minutes.   Tonny Bollman, M.D. 03/14/2023 11:52 AM

## 2023-03-13 NOTE — Anesthesia Preprocedure Evaluation (Signed)
 Anesthesia Evaluation  Patient identified by MRN, date of birth, ID band Patient awake    Reviewed: Allergy & Precautions, NPO status , Patient's Chart, lab work & pertinent test results, reviewed documented beta blocker date and time   History of Anesthesia Complications (+) PONV and history of anesthetic complications  Airway Mallampati: II  TM Distance: >3 FB     Dental  (+) Edentulous Upper, Edentulous Lower   Pulmonary neg COPD, former smoker   breath sounds clear to auscultation       Cardiovascular hypertension, + angina  + CAD  (-) Past MI, (-) Cardiac Stents and (-) CABG + Valvular Problems/Murmurs AS  Rhythm:Regular Rate:Normal     Neuro/Psych neg Seizures PSYCHIATRIC DISORDERS Anxiety Depression       GI/Hepatic ,GERD  ,,(+) neg Cirrhosis        Endo/Other  Hypothyroidism    Renal/GU Renal disease     Musculoskeletal  (+) Arthritis ,    Abdominal   Peds  Hematology  (+) Blood dyscrasia, anemia   Anesthesia Other Findings   Reproductive/Obstetrics                             Anesthesia Physical Anesthesia Plan  ASA: 4  Anesthesia Plan: General   Post-op Pain Management:    Induction: Intravenous  PONV Risk Score and Plan: 2 and Ondansetron and Dexamethasone  Airway Management Planned: Oral ETT  Additional Equipment: Arterial line  Intra-op Plan:   Post-operative Plan: Extubation in OR  Informed Consent: I have reviewed the patients History and Physical, chart, labs and discussed the procedure including the risks, benefits and alternatives for the proposed anesthesia with the patient or authorized representative who has indicated his/her understanding and acceptance.     Dental advisory given  Plan Discussed with: CRNA  Anesthesia Plan Comments:        Anesthesia Quick Evaluation

## 2023-03-14 ENCOUNTER — Inpatient Hospital Stay (HOSPITAL_COMMUNITY)

## 2023-03-14 ENCOUNTER — Other Ambulatory Visit: Payer: Self-pay

## 2023-03-14 ENCOUNTER — Encounter (HOSPITAL_COMMUNITY): Payer: Self-pay | Admitting: Cardiovascular Disease

## 2023-03-14 DIAGNOSIS — Z952 Presence of prosthetic heart valve: Secondary | ICD-10-CM | POA: Diagnosis not present

## 2023-03-14 LAB — CBC
HCT: 30.7 % — ABNORMAL LOW (ref 36.0–46.0)
Hemoglobin: 10.7 g/dL — ABNORMAL LOW (ref 12.0–15.0)
MCH: 31.4 pg (ref 26.0–34.0)
MCHC: 34.9 g/dL (ref 30.0–36.0)
MCV: 90 fL (ref 80.0–100.0)
Platelets: 106 10*3/uL — ABNORMAL LOW (ref 150–400)
RBC: 3.41 MIL/uL — ABNORMAL LOW (ref 3.87–5.11)
RDW: 12.4 % (ref 11.5–15.5)
WBC: 9.4 10*3/uL (ref 4.0–10.5)
nRBC: 0 % (ref 0.0–0.2)

## 2023-03-14 LAB — ECHOCARDIOGRAM COMPLETE
AR max vel: 2.13 cm2
AV Area VTI: 2 cm2
AV Area mean vel: 2.13 cm2
AV Mean grad: 10 mmHg
AV Peak grad: 14.9 mmHg
Ao pk vel: 1.93 m/s
Area-P 1/2: 4.12 cm2
Height: 64 in
S' Lateral: 2.9 cm
Weight: 1844.8 [oz_av]

## 2023-03-14 LAB — BASIC METABOLIC PANEL
Anion gap: 7 (ref 5–15)
BUN: 20 mg/dL (ref 8–23)
CO2: 23 mmol/L (ref 22–32)
Calcium: 9 mg/dL (ref 8.9–10.3)
Chloride: 107 mmol/L (ref 98–111)
Creatinine, Ser: 1.05 mg/dL — ABNORMAL HIGH (ref 0.44–1.00)
GFR, Estimated: 55 mL/min — ABNORMAL LOW (ref >60)
Glucose, Bld: 105 mg/dL — ABNORMAL HIGH (ref 70–99)
Potassium: 4.2 mmol/L (ref 3.5–5.1)
Sodium: 137 mmol/L (ref 135–145)

## 2023-03-14 LAB — MAGNESIUM: Magnesium: 1.6 mg/dL — ABNORMAL LOW (ref 1.7–2.4)

## 2023-03-14 MED ORDER — MAGNESIUM OXIDE -MG SUPPLEMENT 400 (240 MG) MG PO TABS
800.0000 mg | ORAL_TABLET | Freq: Once | ORAL | Status: AC
  Administered 2023-03-14: 800 mg via ORAL
  Filled 2023-03-14: qty 2

## 2023-03-14 NOTE — Progress Notes (Signed)
 Pt sound asleep. Discussed with pt restrictions, walking for exercise, and CRPII. Receptive. Will refer to [place] CRPII. Gave HH diet. Pt to ambulate with MT or daughter later.  [id number] Ethelda Chick BS, ACSM-CEP 03/14/2023 11:09 AM

## 2023-03-14 NOTE — Progress Notes (Signed)
 Checked the patient's vitals this AM and her temp was 100.5 F. She felt very warm; placed cold clothes on head and neck. Rechecked about 30 minutes later and her temp was up to 102 F. Gave Tylenol 650 mg. Informed the on call Cardiologist who ordered for blood cultures.  Will continue to monitor.  Harriet Masson, RN

## 2023-03-14 NOTE — Progress Notes (Signed)
 Echocardiogram 2D Echocardiogram has been performed.  Warren Lacy Keylah Darwish RDCS 03/14/2023, 8:27 AM

## 2023-03-14 NOTE — Progress Notes (Signed)
 Mobility Specialist Progress Note:    03/14/23 1147  Mobility  Activity Ambulated independently in hallway;Ambulated independently to bathroom;Ambulated independently in room  Level of Assistance Standby assist, set-up cues, supervision of patient - no hands on  Assistive Device None  Distance Ambulated (ft) 200 ft  Activity Response Tolerated well  Mobility Referral Yes  Mobility visit 1 Mobility  Mobility Specialist Start Time (ACUTE ONLY) 1140  Mobility Specialist Stop Time (ACUTE ONLY) 1147  Mobility Specialist Time Calculation (min) (ACUTE ONLY) 7 min   Pt received in room, daughter at bedside. Ambulated independently to bathroom, void successful. Ambulated in hallway, no Ad required, SBA for safety. VSS throughout. Tolerated well, asx throughout. Returned pt to room, requested to lie down. All needs met.    Feliciana Rossetti Mobility Specialist Please contact via Special educational needs teacher or  Rehab office at [phone]

## 2023-03-15 ENCOUNTER — Telehealth: Payer: Self-pay | Admitting: Physician Assistant

## 2023-03-15 NOTE — Telephone Encounter (Signed)
  HEART AND VASCULAR CENTER   MULTIDISCIPLINARY HEART VALVE TEAM   Patient contacted regarding discharge from [facility] on 03/14/23  Patient understands to follow up with a structural heart APP on 3/19 at [address].  Patient understands discharge instructions? yes Patient understands medications and regimen? yes Patient understands to bring all medications to this visit? Yes  They are keeping a log of her BPs.   Cline Crock PA-C  [facility]

## 2023-03-19 LAB — CULTURE, BLOOD (ROUTINE X 2)
Culture: NO GROWTH
Culture: NO GROWTH
Special Requests: ADEQUATE

## 2023-03-19 NOTE — Progress Notes (Unsigned)
 HEART AND VASCULAR CENTER   MULTIDISCIPLINARY HEART VALVE CLINIC                                     Cardiology Office Note:    Date:  03/21/2023   ID:  [name], DOB [birth year], MRN [id number]  PCP:  [name], MD  [facility] Cardiologist:  [name], MD  / Dr. Excell Seltzer & Dr. Laneta Simmers (TAVR)  [facility] Electrophysiologist:  None   Referring MD: [name], MD   [facility] s/p TAVR  History of Present Illness:    ELIZABELLA NOLET is a 78 y.o. female with a hx of CAD s/p CABG (BKB 1994) s/p SVG stenting (2019), PAD s/p left subclavian artery stenting (2019) & right iliac stenting, HTN, HLD, GERD, anemia, mild thrombocytopenia, ILD noted on CT scans and severe AS s/p TAVR (03/13/23) who presents to clinic for follow up.   Echo 08/25/22 showed EF 55% and severe AS with a mean grad 38 mmHg, AVA 0.8 cm2, DVI 0.23 and mild AR/MR. She had a cardiac CTA on 10/02/2022 showing an aortic valve calcium score of 3070, a patent LIMA to the LAD and patent SVG to the obtuse marginal. This also showed severe aortoiliac atherosclerosis and possible ILD. Follow up PFTs were reasonable with FEV1 of 1.98. She reported shortness of breath with activity and generalized fatigue. S/p successful TAVR with a 23 mm Edwards Sapien 3 Ultra Resilia THV via the left carotid approach on 03/13/23. Post operative echo showed EF 65%, mild RV dysfunction, normally functioning TAVR with a mean gradient of 10 mmHg and trivial PVL as well as moderate MAC with trivial MR. She had a fever on POD1 and blood cultures were negative. Her BPs were soft and home Norvasc 5mg  and Enalapril 10mg  daily were held.   Today the patient presents to clinic for follow up. Here with her daughter, Wyatt Mage. She is doing well with no issues. Has been monitoring her BP at home and overall pretty good control with a few elevated readings. Daughter would like her to be off as many medications as possible to keep her minerals from being depleted. No CP  or SOB. No LE edema, orthopnea or PND. No dizziness or syncope. No blood in stool or urine. Felt her heart racing once with an elevated BP reading and took her Toprol XL with resolution.     Past Medical History:  Diagnosis Date   Anemia    hx   Anxiety    Arthritis    Carotid arterial disease (HCC)    a. 04/2015 Carotid U/S: RICA 1-39%, LICA 40-59%, no change.   Coronary artery disease    a. 1994 s/p CABG;  b. 03/2012 MV: No ischemia/infarct. 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 Excell Seltzer & Dr. Laneta Simmers   Severe aortic stenosis      Current  Medications: Current Meds  Medication Sig   ALPRAZolam (XANAX) 1 MG tablet Take 1 mg by mouth at bedtime.    clopidogrel (PLAVIX) 75 MG tablet TAKE 1 TABLET EVERY DAY   Coenzyme Q10 100 MG capsule Take 200 mg by mouth daily.   ezetimibe (ZETIA) 10 MG tablet TAKE 1 TABLET EVERY DAY   isosorbide mononitrate (IMDUR) 30 MG 24 hr tablet Take 1/2 tablet (15 mg total) by mouth daily.   L-Theanine 200 MG CAPS Take 200 mg by mouth daily.   linaclotide (LINZESS) 290 MCG CAPS capsule Take 290 mcg by mouth daily as needed (constipation).   MAGNESIUM COMPLEX HIGH POTENCY PO Take 2 tablets by mouth daily.   Misc Natural Products (CHOLESTEROL SUPPORT PO) Take 5 capsules by mouth daily.   Multiple Vitamins-Minerals (MULTIVITAMIN WITH MINERALS) tablet Take 1 tablet by mouth daily.   nitroGLYCERIN (NITROSTAT) 0.4 MG SL tablet Place 1 tablet (0.4 mg total) under the tongue every 5 (five) minutes as needed for chest pain.  PLACE ONE TABLET UNDER THE TONGUE EVERY 5 MINUTES FOR THREE DOSES AS NEEDED FOR CHEST PAIN   OVER THE COUNTER MEDICATION Take 1 tablet by mouth daily. Factor Advanced Formula   pantoprazole (PROTONIX) 40 MG tablet Take 40 mg by mouth at bedtime.   PRESCRIPTION MEDICATION PATIENT DOES NOT TAKE ANY VACCINES   sertraline (ZOLOFT) 100 MG tablet Take 50 mg by mouth daily.   Sodium Phosphates (FLEET ENEMA RE) Place 1 enema rectally 2 (two) times daily.   Vitamin D-Vitamin K (VITAMIN K2-VITAMIN D3 PO) Take 2 tablets by mouth daily.   [DISCONTINUED] metoprolol succinate (TOPROL-XL) 50 MG 24 hr tablet Take 1 tablet (50 mg total) by mouth 2 (two) times daily. Take with or immediately following a meal. (Patient taking differently: Take 50 mg by mouth daily. Take with or immediately following a meal.)      ROS:   Please see the history of present illness.    All other systems reviewed and are negative.  EKGs   EKG Interpretation Date/Time:  Wednesday March 21 2023 13:32:06 EDT Ventricular Rate:  77 PR Interval:  162 QRS Duration:  102 QT Interval:  360 QTC Calculation: 407 R Axis:   95  Text Interpretation: Normal sinus rhythm Rightward axis ST & T wave abnormality, consider inferior ischemia ST & T wave abnormality, consider anterolateral ischemia Confirmed by Cline Crock [phone]) on 03/21/2023 1:44:38 PM   Risk Assessment/Calculations:           Physical Exam:    VS:  BP 122/68 (BP Location: Right Arm)   Pulse 77   Ht 5\' 2"  (1.575 m)   Wt 114 lb 12.8 oz (52.1 kg)   SpO2 96%   BMI 21.00 kg/m     Wt Readings from Last 3 Encounters:  03/21/23 114 lb 12.8 oz (52.1 kg)  03/14/23 115 lb 4.8 oz (52.3 kg)  02/28/23 116 lb (52.6 kg)     GEN: Well nourished, well developed in no acute distress NECK: No JVD CARDIAC: RRR, no murmurs, rubs, gallops RESPIRATORY:  Clear to auscultation without rales, wheezing or rhonchi  ABDOMEN: Soft, non-tender, non-distended EXTREMITIES:  No  edema; No deformity.  Groin/carotid sites clear without hematoma or ecchymosis  ASSESSMENT:    1. S/P TAVR (transcatheter aortic valve replacement)   2. Essential hypertension, benign   3. Coronary artery disease involving native coronary artery of native heart with other form of angina pectoris (HCC)   4. ILD (interstitial lung disease) (  HCC)   5. PAD (peripheral artery disease) (HCC)   6. Bile duct abnormality   7. Pulmonary nodule   8. Compression deformity of vertebra     PLAN:    In order of problems listed above:  Severe AS s/p TAVR:  -- Pt doing well s/p TAVR.  -- ECG with no HAVB.  -- Groin/carotid sites healing well.  -- SBE prophylaxis discussed; the patient is edentulous and does not go to the dentist.  -- Continue Plavix 75mg  daily.  -- Cleared to resume all activities without restriction. -- I will see back for 1 month echo and OV.  HTN: -- BP well controlled today. -- Continue Imdur 15mg  daily and Toprol XL 50mg  BID. -- Norvasc 5mg  daily and Enalapril 10mg  daily held at discharged due to soft BPs (will plan to keep these off as daughter and pt prefer to be on less medications and BPs under reasonable control).  CAD s/p CABG: -- Cardiac CT 10/02/22 with patent LIMA to the LAD and patent SVG to the obtuse marginal. -- Continue Plavix 75mg  daily. -- Refuses statin therapy.  ILD: -- Noted on pre TAVR CTs. -- Follow up PFTs with FEV1 of 1.98.   PAD: -- S/p SVG stenting (2019), PAD s/p left subclavian artery stenting (2019) & right iliac stenting. -- Continue Plavix 75mg  daily.  -- Refuses statin therapy.   Bile duct lesion: -- Pre TAVR CTs showed a new small soft tissue density filling defect of the distal common bile duct measuring 4 mm, may represent a stone or neoplasm.  -- Recommend contrast-enhanced MRCP for further evaluation.  -- Discussed today and pt/daughter decline further work up.    Pulmonary nodule:  -- Pre TAVR CTs showed a solid subpleural  nodule of the left lower lobe measuring 6 mm.  -- Non-contrast chest CT at 6-12 months is recommended. If the nodule is stable at time of repeat CT, then future CT at 18-24 months (from today's scan) is considered optional for low-risk patients, but is recommended for high-risk patients.  -- Discussed today and pt/daughter decline further work up.    Compression deformities: -- Pre TAVR CTs showed an age-indeterminate severe compression deformity of T8 and mild age-indeterminate L1 and L3 compression deformities.  -- Discussed with pt and she does have some back pain in that area.     Medication Adjustments/Labs and Tests Ordered: Current medicines are reviewed at length with the patient today.  Concerns regarding medicines are outlined above.  Orders Placed This Encounter  Procedures   EKG 12-Lead   EKG 12-Lead   Meds ordered this encounter  Medications   metoprolol succinate (TOPROL-XL) 50 MG 24 hr tablet    Sig: Take 1 tablet (50 mg total) by mouth 2 (two) times daily. Take with or immediately following a meal.    Dispense:  180 tablet    Refill:  3    Patient Instructions  Medication Instructions:  Your physician recommends that you continue on your current medications as directed. Please refer to the Current Medication list given to you today. TAKE: metoprolol succinate (Toprol-XL) 50 mg by mouth twice daily STOP: amlodipine  STOP: enalapril   *If you need a refill on your cardiac medications before your next appointment, please call your pharmacy*   Lab Work: NONE  If you have labs (blood work) drawn today and your tests are completely normal, you will receive your results only by: MyChart Message (if you have MyChart) OR A paper copy in  the mail If you have any lab test that is abnormal or we need to change your treatment, we will call you to review the results.   Testing/Procedures: NONE   Follow-Up:as scheduled At [facility], you and your health  needs are our priority.  As part of our continuing mission to provide you with exceptional heart care, we have created designated Provider Care Teams.  These Care Teams include your primary Cardiologist (physician) and Advanced Practice Providers (APPs -  Physician Assistants and Nurse Practitioners) who all work together to provide you with the care you need, when you need it.   Provider:   Carlean Jews, PA-C          Signed, Cline Crock, PA-C  03/21/2023 2:09 PM    [facility]

## 2023-03-20 ENCOUNTER — Telehealth (HOSPITAL_COMMUNITY): Payer: Self-pay

## 2023-03-20 NOTE — Telephone Encounter (Signed)
 Called and spoke with pt in regards to CR, pt stated she is not interested at this time.   Closed referral

## 2023-03-20 NOTE — Telephone Encounter (Signed)
 Pt insurance is active and benefits verified through [facility]. Co-pay $25.00, DED $0.00/$0.00 met, out of pocket $6,750.00/$181.32 met, co-insurance 0%. No pre-authorization required. Passport, 03/20/23 @ 2:02PM, REF#[id number]   How many CR sessions are covered? (36 visits for TCR, 72 visits for ICR)72 Is this a lifetime maximum or an annual maximum? Annual Has the member used any of these services to date? No Is there a time limit (weeks/months) on start of program and/or program completion? No     Will contact patient to see if she is interested in the Cardiac Rehab Program. If interested, patient will need to complete follow up appt. Once completed, patient will be contacted for scheduling upon review by the RN Navigator.

## 2023-03-21 ENCOUNTER — Ambulatory Visit: Attending: Physician Assistant | Admitting: Physician Assistant

## 2023-03-21 DIAGNOSIS — R911 Solitary pulmonary nodule: Secondary | ICD-10-CM | POA: Diagnosis not present

## 2023-03-21 DIAGNOSIS — Z952 Presence of prosthetic heart valve: Secondary | ICD-10-CM | POA: Diagnosis not present

## 2023-03-21 DIAGNOSIS — J849 Interstitial pulmonary disease, unspecified: Secondary | ICD-10-CM

## 2023-03-21 DIAGNOSIS — I10 Essential (primary) hypertension: Secondary | ICD-10-CM | POA: Diagnosis not present

## 2023-03-21 DIAGNOSIS — M439 Deforming dorsopathy, unspecified: Secondary | ICD-10-CM | POA: Diagnosis not present

## 2023-03-21 DIAGNOSIS — K839 Disease of biliary tract, unspecified: Secondary | ICD-10-CM | POA: Diagnosis not present

## 2023-03-21 DIAGNOSIS — I25118 Atherosclerotic heart disease of native coronary artery with other forms of angina pectoris: Secondary | ICD-10-CM | POA: Diagnosis not present

## 2023-03-21 DIAGNOSIS — I739 Peripheral vascular disease, unspecified: Secondary | ICD-10-CM | POA: Diagnosis not present

## 2023-03-21 MED ORDER — METOPROLOL SUCCINATE ER 50 MG PO TB24
50.0000 mg | ORAL_TABLET | Freq: Two times a day (BID) | ORAL | 3 refills | Status: AC
Start: 2023-03-21 — End: ?

## 2023-03-21 NOTE — Patient Instructions (Addendum)
 Medication Instructions:  Your physician recommends that you continue on your current medications as directed. Please refer to the Current Medication list given to you today. TAKE: metoprolol succinate (Toprol-XL) 50 mg by mouth twice daily STOP: amlodipine  STOP: enalapril   *If you need a refill on your cardiac medications before your next appointment, please call your pharmacy*   Lab Work: NONE  If you have labs (blood work) drawn today and your tests are completely normal, you will receive your results only by: MyChart Message (if you have MyChart) OR A paper copy in the mail If you have any lab test that is abnormal or we need to change your treatment, we will call you to review the results.   Testing/Procedures: NONE   Follow-Up:as scheduled At [facility], you and your health needs are our priority.  As part of our continuing mission to provide you with exceptional heart care, we have created designated Provider Care Teams.  These Care Teams include your primary Cardiologist (physician) and Advanced Practice Providers (APPs -  Physician Assistants and Nurse Practitioners) who all work together to provide you with the care you need, when you need it.   Provider:   Carlean Jews, PA-C

## 2023-03-28 DIAGNOSIS — E039 Hypothyroidism, unspecified: Secondary | ICD-10-CM | POA: Diagnosis not present

## 2023-03-28 DIAGNOSIS — I350 Nonrheumatic aortic (valve) stenosis: Secondary | ICD-10-CM | POA: Diagnosis not present

## 2023-03-28 DIAGNOSIS — E876 Hypokalemia: Secondary | ICD-10-CM | POA: Diagnosis not present

## 2023-03-28 DIAGNOSIS — E782 Mixed hyperlipidemia: Secondary | ICD-10-CM | POA: Diagnosis not present

## 2023-03-28 DIAGNOSIS — Z09 Encounter for follow-up examination after completed treatment for conditions other than malignant neoplasm: Secondary | ICD-10-CM | POA: Diagnosis not present

## 2023-03-28 DIAGNOSIS — I2510 Atherosclerotic heart disease of native coronary artery without angina pectoris: Secondary | ICD-10-CM | POA: Diagnosis not present

## 2023-04-10 NOTE — Progress Notes (Unsigned)
 [facility]   MULTIDISCIPLINARY HEART VALVE CLINIC                                     Cardiology Office Note:    Date:  04/11/2023   ID:  Dawn Boyer, DOB 10-Feb-1945, MRN [id number]  PCP:  [name], MD  [facility] Cardiologist:  [name], MD  / Dr. Excell Seltzer & Dr. Laneta Simmers (TAVR)  [facility] Electrophysiologist:  None   Referring MD: [name], MD   1 month s/p TAVR  History of Present Illness:    Dawn Boyer is a 78 y.o. female with a hx of CAD s/p CABG (BKB 1994) s/p SVG stenting (2019), PAD s/p left subclavian artery stenting (2019) & right iliac stenting, HTN, HLD, GERD, anemia, mild thrombocytopenia, ILD noted on CT scans and severe AS s/p TAVR (03/13/23) who presents to clinic for follow up.   Echo 08/25/22 showed EF 55% and severe AS with a mean grad 38 mmHg, AVA 0.8 cm2, DVI 0.23 and mild AR/MR. She had a cardiac CTA on 10/02/2022 showing an aortic valve calcium score of 3070, a patent LIMA to the LAD and patent SVG to the obtuse marginal. This also showed severe aortoiliac atherosclerosis and possible ILD. Follow up PFTs were reasonable with FEV1 of 1.98. She reported shortness of breath with activity and generalized fatigue. S/p successful TAVR with a 23 mm Edwards Sapien 3 Ultra Resilia THV via the left carotid approach on 03/13/23. Post operative echo showed EF 65%, mild RV dysfunction, normally functioning TAVR with a mean gradient of 10 mmHg and trivial PVL as well as moderate MAC with trivial MR. She had a fever on POD1 and blood cultures were negative. Her BPs were soft and home Norvasc 5mg  and Enalapril 10mg  daily were held.   Today the patient presents to clinic for follow up. Here with her daughter, Wyatt Mage. She has been doing very well with an improvement in her breathing since TAVR. No CP or SOB. No LE edema, orthopnea or PND. No dizziness or syncope. No blood in stool or urine. No palpitations. Has occasional fatigue.     Past Medical  History:  Diagnosis Date   Anemia    hx   Anxiety    Arthritis    Carotid arterial disease (HCC)    a. 04/2015 Carotid U/S: RICA 1-39%, LICA 40-59%, no change.   Coronary artery disease    a. 1994 s/p CABG;  b. 03/2012 MV: No ischemia/infarct. 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 Excell Seltzer & Dr. Laneta Simmers   Severe aortic stenosis      Current Medications: Current Meds  Medication Sig   ALPRAZolam (XANAX) 1 MG tablet Take 1 mg by mouth at bedtime.    amLODipine (NORVASC) 5 MG tablet Take 1 tablet (5 mg total) by mouth daily.   clopidogrel (  PLAVIX) 75 MG tablet TAKE 1 TABLET EVERY DAY   Coenzyme Q10 100 MG capsule Take 200 mg by mouth daily.   ezetimibe (ZETIA) 10 MG tablet TAKE 1 TABLET EVERY DAY   isosorbide mononitrate (IMDUR) 30 MG 24 hr tablet Take 1/2 tablet (15 mg total) by mouth daily.   L-Theanine 200 MG CAPS Take 200 mg by mouth daily.   linaclotide (LINZESS) 290 MCG CAPS capsule Take 290 mcg by mouth daily as needed (constipation).   MAGNESIUM COMPLEX HIGH POTENCY PO Take 2 tablets by mouth daily.   metoprolol succinate (TOPROL-XL) 50 MG 24 hr tablet Take 1 tablet (50 mg total) by mouth 2 (two) times daily. Take with or immediately following a meal.   Misc Natural Products (CHOLESTEROL SUPPORT PO) Take 5 capsules by mouth daily.   Multiple Vitamins-Minerals (MULTIVITAMIN WITH MINERALS) tablet Take 1 tablet by mouth daily.   nitroGLYCERIN (NITROSTAT) 0.4 MG SL tablet Place 1 tablet (0.4 mg total) under the tongue every 5 (five) minutes as  needed for chest pain. PLACE ONE TABLET UNDER THE TONGUE EVERY 5 MINUTES FOR THREE DOSES AS NEEDED FOR CHEST PAIN   OVER THE COUNTER MEDICATION Take 1 tablet by mouth daily. Factor Advanced Formula   pantoprazole (PROTONIX) 40 MG tablet Take 40 mg by mouth at bedtime.   PRESCRIPTION MEDICATION PATIENT DOES NOT TAKE ANY VACCINES   sertraline (ZOLOFT) 100 MG tablet Take 50 mg by mouth daily.   Sodium Phosphates (FLEET ENEMA RE) Place 1 enema rectally 2 (two) times daily.   Vitamin D-Vitamin K (VITAMIN K2-VITAMIN D3 PO) Take 2 tablets by mouth daily.      ROS:   Please see the history of present illness.    All other systems reviewed and are negative.  EKGs       Risk Assessment/Calculations:           Physical Exam:    VS:  BP (!) 148/88 (BP Location: Right Arm, Patient Position: Sitting, Cuff Size: Normal)   Pulse 65   Resp 16   Ht 5\' 2"  (1.575 m)   Wt 116 lb (52.6 kg)   SpO2 98%   BMI 21.22 kg/m     Wt Readings from Last 3 Encounters:  04/11/23 116 lb (52.6 kg)  03/21/23 114 lb 12.8 oz (52.1 kg)  03/14/23 115 lb 4.8 oz (52.3 kg)     GEN: Well nourished, well developed in no acute distress NECK: No JVD CARDIAC: RRR, soft flow murmur heard over RUSB. No rubs, gallops RESPIRATORY:  Clear to auscultation without rales, wheezing or rhonchi  ABDOMEN: Soft, non-tender, non-distended EXTREMITIES:  No edema; No deformity.    ASSESSMENT:    1. S/P TAVR (transcatheter aortic valve replacement)   2. Essential hypertension, benign   3. Coronary artery disease involving native coronary artery of native heart with other form of angina pectoris (HCC)   4. ILD (interstitial lung disease) (HCC)   5. PAD (peripheral artery disease) (HCC)   6. Bile duct abnormality   7. Pulmonary nodule      PLAN:    In order of problems listed above:  Severe AS s/p TAVR:  -- Echo today shows EF 55%, normally functioning TAVR with a mean gradient of 12 mm hg and no PVL as well as severe  MAC with mild MR.  -- NYHA class I symptoms.  -- SBE prophylaxis discussed; the patient is edentulous and does not go to the dentist.  -- Continue Plavix 75mg  daily.  --  I will see back for 1 year echo and OV.  HTN: -- Continue Imdur 15mg  daily and Toprol XL 50mg  BID. -- Norvasc 5mg  daily and Enalapril 10mg  daily held at discharged due to soft BPs. -- BP is elevated today, plan to resume Norvasc 5mg  daily.   CAD s/p CABG: -- Cardiac CT 10/02/22 with patent LIMA to the LAD and patent SVG to the obtuse marginal. -- Continue Plavix 75mg  daily. -- Refuses statin therapy.  ILD: -- Noted on pre TAVR CTs. -- Follow up PFTs with FEV1 of 1.98.   PAD: -- S/p SVG stenting (2019), PAD s/p left subclavian artery stenting (2019) & right iliac stenting. -- Continue Plavix 75mg  daily.  -- Refuses statin therapy.   Bile duct lesion: -- Pre TAVR CTs showed a new small soft tissue density filling defect of the distal common bile duct measuring 4 mm, may represent a stone or neoplasm.  -- Recommend contrast-enhanced MRCP for further evaluation.  -- Discussed and pt/daughter decline further work up.    Pulmonary nodule:  -- Pre TAVR CTs showed a solid subpleural nodule of the left lower lobe measuring 6 mm.  -- Non-contrast chest CT at 6-12 months is recommended. If the nodule is stable at time of repeat CT, then future CT at 18-24 months (from today's scan) is considered optional for low-risk patients, but is recommended for high-risk patients.  -- Discussed and pt/daughter decline further work up.    Compression deformities: -- Pre TAVR CTs showed an age-indeterminate severe compression deformity of T8 and mild age-indeterminate L1 and L3 compression deformities.  -- Discussed with pt and she does have some back pain in that area.     Medication Adjustments/Labs and Tests Ordered: Current medicines are reviewed at length with the patient today.  Concerns regarding medicines are outlined above.   No orders of the defined types were placed in this encounter.  Meds ordered this encounter  Medications   amLODipine (NORVASC) 5 MG tablet    Sig: Take 1 tablet (5 mg total) by mouth daily.    Dispense:  90 tablet    Refill:  3    Patient Instructions  Medication Instructions:  Your physician has recommended you make the following change in your medication:  START AMLODIPINE 5 MG DAILY.   *If you need a refill on your cardiac medications before your next appointment, please call your pharmacy*  Lab Work: NONE If you have labs (blood work) drawn today and your tests are completely normal, you will receive your results only by: MyChart Message (if you have MyChart) OR A paper copy in the mail If you have any lab test that is abnormal or we need to change your treatment, we will call you to review the results.  Testing/Procedures: NONE  Follow-Up: At [facility], you and your health needs are our priority.  As part of our continuing mission to provide you with exceptional heart care, our providers are all part of one team.  This team includes your primary Cardiologist (physician) and Advanced Practice Providers or APPs (Physician Assistants and Nurse Practitioners) who all work together to provide you with the care you need, when you need it.  Your next appointment:   KEEP SCHEDULED FOLLOW-UP  We recommend signing up for the patient portal called "MyChart".  Sign up information is provided on this After Visit Summary.  MyChart is used to connect with patients for Virtual Visits (Telemedicine).  Patients are able to view lab/test results, encounter  notes, upcoming appointments, etc.  Non-urgent messages can be sent to your provider as well.   To learn more about what you can do with MyChart, go to [email or web address].   Other Instructions       1st Floor: - Lobby - Registration  - Pharmacy  - Lab - Cafe  2nd Floor: - PV Lab - Diagnostic Testing (echo, CT,  nuclear med)  3rd Floor: - Vacant  4th Floor: - TCTS (cardiothoracic surgery) - AFib Clinic - Structural Heart Clinic - Vascular Surgery  - Vascular Ultrasound  5th Floor: - HeartCare Cardiology (general and EP) - Clinical Pharmacy for coumadin, hypertension, lipid, weight-loss medications, and med management appointments    Valet parking services will be available as well.      Signed, Cline Crock, PA-C  04/11/2023 5:07 PM    [facility]

## 2023-04-11 ENCOUNTER — Ambulatory Visit (INDEPENDENT_AMBULATORY_CARE_PROVIDER_SITE_OTHER): Admitting: Physician Assistant

## 2023-04-11 ENCOUNTER — Ambulatory Visit (HOSPITAL_COMMUNITY): Attending: Cardiovascular Disease

## 2023-04-11 VITALS — BP 148/88 | HR 65 | Resp 16 | Ht 62.0 in | Wt 116.0 lb

## 2023-04-11 DIAGNOSIS — K839 Disease of biliary tract, unspecified: Secondary | ICD-10-CM

## 2023-04-11 DIAGNOSIS — I10 Essential (primary) hypertension: Secondary | ICD-10-CM

## 2023-04-11 DIAGNOSIS — R911 Solitary pulmonary nodule: Secondary | ICD-10-CM

## 2023-04-11 DIAGNOSIS — J849 Interstitial pulmonary disease, unspecified: Secondary | ICD-10-CM

## 2023-04-11 DIAGNOSIS — I25118 Atherosclerotic heart disease of native coronary artery with other forms of angina pectoris: Secondary | ICD-10-CM | POA: Diagnosis not present

## 2023-04-11 DIAGNOSIS — Z952 Presence of prosthetic heart valve: Secondary | ICD-10-CM

## 2023-04-11 DIAGNOSIS — I739 Peripheral vascular disease, unspecified: Secondary | ICD-10-CM | POA: Diagnosis not present

## 2023-04-11 LAB — ECHOCARDIOGRAM COMPLETE
AR max vel: 1.8 cm2
AV Area VTI: 1.8 cm2
AV Area mean vel: 1.77 cm2
AV Mean grad: 12 mmHg
AV Peak grad: 20.6 mmHg
Ao pk vel: 2.27 m/s
Area-P 1/2: 2.87 cm2
S' Lateral: 3.2 cm

## 2023-04-11 MED ORDER — AMLODIPINE BESYLATE 5 MG PO TABS: 5.0000 mg | ORAL_TABLET | Freq: Every day | ORAL | 3 refills | Status: AC

## 2023-04-11 NOTE — Patient Instructions (Signed)
 Medication Instructions:  Your physician has recommended you make the following change in your medication:  START AMLODIPINE 5 MG DAILY.   *If you need a refill on your cardiac medications before your next appointment, please call your pharmacy*  Lab Work: NONE If you have labs (blood work) drawn today and your tests are completely normal, you will receive your results only by: MyChart Message (if you have MyChart) OR A paper copy in the mail If you have any lab test that is abnormal or we need to change your treatment, we will call you to review the results.  Testing/Procedures: NONE  Follow-Up: At [facility], you and your health needs are our priority.  As part of our continuing mission to provide you with exceptional heart care, our providers are all part of one team.  This team includes your primary Cardiologist (physician) and Advanced Practice Providers or APPs (Physician Assistants and Nurse Practitioners) who all work together to provide you with the care you need, when you need it.  Your next appointment:   KEEP SCHEDULED FOLLOW-UP  We recommend signing up for the patient portal called "MyChart".  Sign up information is provided on this After Visit Summary.  MyChart is used to connect with patients for Virtual Visits (Telemedicine).  Patients are able to view lab/test results, encounter notes, upcoming appointments, etc.  Non-urgent messages can be sent to your provider as well.   To learn more about what you can do with MyChart, go to [email or web address].   Other Instructions       1st Floor: - Lobby - Registration  - Pharmacy  - Lab - Cafe  2nd Floor: - PV Lab - Diagnostic Testing (echo, CT, nuclear med)  3rd Floor: - Vacant  4th Floor: - TCTS (cardiothoracic surgery) - AFib Clinic - Structural Heart Clinic - Vascular Surgery  - Vascular Ultrasound  5th Floor: - HeartCare Cardiology (general and EP) - Clinical Pharmacy for coumadin,  hypertension, lipid, weight-loss medications, and med management appointments    Valet parking services will be available as well.

## 2023-04-12 ENCOUNTER — Institutional Professional Consult (permissible substitution): Payer: Medicare HMO | Admitting: Cardiovascular Disease

## 2023-06-18 DIAGNOSIS — M545 Low back pain, unspecified: Secondary | ICD-10-CM | POA: Diagnosis not present

## 2023-07-17 DIAGNOSIS — Z136 Encounter for screening for cardiovascular disorders: Secondary | ICD-10-CM | POA: Diagnosis not present

## 2023-07-17 DIAGNOSIS — I6522 Occlusion and stenosis of left carotid artery: Secondary | ICD-10-CM | POA: Diagnosis not present

## 2023-07-17 DIAGNOSIS — I10 Essential (primary) hypertension: Secondary | ICD-10-CM | POA: Diagnosis not present

## 2023-07-17 DIAGNOSIS — Z0000 Encounter for general adult medical examination without abnormal findings: Secondary | ICD-10-CM | POA: Diagnosis not present

## 2023-07-17 DIAGNOSIS — Z5181 Encounter for therapeutic drug level monitoring: Secondary | ICD-10-CM | POA: Diagnosis not present

## 2023-07-17 DIAGNOSIS — E782 Mixed hyperlipidemia: Secondary | ICD-10-CM | POA: Diagnosis not present

## 2023-07-17 DIAGNOSIS — I739 Peripheral vascular disease, unspecified: Secondary | ICD-10-CM | POA: Diagnosis not present

## 2023-07-17 DIAGNOSIS — F411 Generalized anxiety disorder: Secondary | ICD-10-CM | POA: Diagnosis not present

## 2023-07-17 DIAGNOSIS — D696 Thrombocytopenia, unspecified: Secondary | ICD-10-CM | POA: Diagnosis not present

## 2023-07-17 DIAGNOSIS — R739 Hyperglycemia, unspecified: Secondary | ICD-10-CM | POA: Diagnosis not present

## 2023-07-17 DIAGNOSIS — E039 Hypothyroidism, unspecified: Secondary | ICD-10-CM | POA: Diagnosis not present

## 2023-09-12 ENCOUNTER — Other Ambulatory Visit: Payer: Self-pay | Admitting: Cardiovascular Disease

## 2023-09-17 DIAGNOSIS — H524 Presbyopia: Secondary | ICD-10-CM | POA: Diagnosis not present

## 2023-09-18 ENCOUNTER — Ambulatory Visit: Admitting: Cardiovascular Disease

## 2023-10-23 ENCOUNTER — Other Ambulatory Visit: Payer: Self-pay

## 2023-10-23 DIAGNOSIS — Z952 Presence of prosthetic heart valve: Secondary | ICD-10-CM

## 2024-01-24 ENCOUNTER — Other Ambulatory Visit: Payer: Self-pay | Admitting: *Deleted

## 2024-01-24 DIAGNOSIS — I6523 Occlusion and stenosis of bilateral carotid arteries: Secondary | ICD-10-CM

## 2024-01-24 DIAGNOSIS — I739 Peripheral vascular disease, unspecified: Secondary | ICD-10-CM

## 2024-02-07 ENCOUNTER — Other Ambulatory Visit: Payer: Self-pay | Admitting: Cardiovascular Disease

## 2024-02-07 NOTE — Telephone Encounter (Signed)
 Please contact pt for future appointment with Dr. Darron. Pt overdue for follow up.

## 2024-02-26 ENCOUNTER — Ambulatory Visit

## 2024-03-13 ENCOUNTER — Other Ambulatory Visit (HOSPITAL_COMMUNITY)

## 2024-03-13 ENCOUNTER — Ambulatory Visit: Admitting: Physician Assistant
